# Patient Record
Sex: Female | Born: 1935 | Race: White | Hispanic: No | Smoking: Never smoker
Health system: Southern US, Community
[De-identification: ages and names within clinical notes are randomized; demographics above are authoritative.]

## PROBLEM LIST (undated history)

## (undated) DIAGNOSIS — J449 Chronic obstructive pulmonary disease, unspecified: Secondary | ICD-10-CM

## (undated) DIAGNOSIS — I441 Atrioventricular block, second degree: Secondary | ICD-10-CM

## (undated) DIAGNOSIS — J189 Pneumonia, unspecified organism: Secondary | ICD-10-CM

## (undated) DIAGNOSIS — I10 Essential (primary) hypertension: Secondary | ICD-10-CM

## (undated) DIAGNOSIS — N189 Chronic kidney disease, unspecified: Secondary | ICD-10-CM

## (undated) DIAGNOSIS — G20 Parkinson's disease: Secondary | ICD-10-CM

## (undated) DIAGNOSIS — I5032 Chronic diastolic (congestive) heart failure: Secondary | ICD-10-CM

## (undated) DIAGNOSIS — Z950 Presence of cardiac pacemaker: Secondary | ICD-10-CM

## (undated) DIAGNOSIS — D509 Iron deficiency anemia, unspecified: Secondary | ICD-10-CM

## (undated) HISTORY — PX: TOTAL ABDOMINAL HYSTERECTOMY: SHX209

## (undated) HISTORY — PX: APPENDECTOMY: SHX54

---

## 2015-04-08 ENCOUNTER — Emergency Department (HOSPITAL_COMMUNITY): Payer: Medicare Other

## 2015-04-08 ENCOUNTER — Inpatient Hospital Stay (HOSPITAL_COMMUNITY)
Admission: EM | Admit: 2015-04-08 | Discharge: 2015-04-14 | DRG: 242 | Disposition: A | Discharge status: Skilled Nursing Facility | Payer: Medicare Other | Attending: Cardiovascular Disease | Admitting: Cardiovascular Disease

## 2015-04-08 ENCOUNTER — Encounter (HOSPITAL_COMMUNITY): Payer: Self-pay | Admitting: Emergency Medicine

## 2015-04-08 DIAGNOSIS — I509 Heart failure, unspecified: Secondary | ICD-10-CM

## 2015-04-08 DIAGNOSIS — Z91128 Patient's intentional underdosing of medication regimen for other reason: Secondary | ICD-10-CM | POA: Diagnosis not present

## 2015-04-08 DIAGNOSIS — I5032 Chronic diastolic (congestive) heart failure: Secondary | ICD-10-CM | POA: Diagnosis present

## 2015-04-08 DIAGNOSIS — I5030 Unspecified diastolic (congestive) heart failure: Secondary | ICD-10-CM | POA: Diagnosis not present

## 2015-04-08 DIAGNOSIS — G20 Parkinson's disease: Secondary | ICD-10-CM | POA: Diagnosis present

## 2015-04-08 DIAGNOSIS — R0602 Shortness of breath: Secondary | ICD-10-CM | POA: Diagnosis present

## 2015-04-08 DIAGNOSIS — I130 Hypertensive heart and chronic kidney disease with heart failure and stage 1 through stage 4 chronic kidney disease, or unspecified chronic kidney disease: Secondary | ICD-10-CM | POA: Diagnosis present

## 2015-04-08 DIAGNOSIS — N179 Acute kidney failure, unspecified: Secondary | ICD-10-CM | POA: Diagnosis present

## 2015-04-08 DIAGNOSIS — I4510 Unspecified right bundle-branch block: Secondary | ICD-10-CM | POA: Diagnosis present

## 2015-04-08 DIAGNOSIS — R001 Bradycardia, unspecified: Secondary | ICD-10-CM | POA: Diagnosis not present

## 2015-04-08 DIAGNOSIS — E876 Hypokalemia: Secondary | ICD-10-CM | POA: Diagnosis not present

## 2015-04-08 DIAGNOSIS — Z791 Long term (current) use of non-steroidal anti-inflammatories (NSAID): Secondary | ICD-10-CM

## 2015-04-08 DIAGNOSIS — T501X6A Underdosing of loop [high-ceiling] diuretics, initial encounter: Secondary | ICD-10-CM | POA: Diagnosis present

## 2015-04-08 DIAGNOSIS — N189 Chronic kidney disease, unspecified: Secondary | ICD-10-CM | POA: Diagnosis present

## 2015-04-08 DIAGNOSIS — Z8249 Family history of ischemic heart disease and other diseases of the circulatory system: Secondary | ICD-10-CM

## 2015-04-08 DIAGNOSIS — Z7982 Long term (current) use of aspirin: Secondary | ICD-10-CM | POA: Diagnosis not present

## 2015-04-08 DIAGNOSIS — R55 Syncope and collapse: Secondary | ICD-10-CM | POA: Diagnosis not present

## 2015-04-08 DIAGNOSIS — D519 Vitamin B12 deficiency anemia, unspecified: Secondary | ICD-10-CM | POA: Diagnosis present

## 2015-04-08 DIAGNOSIS — I350 Nonrheumatic aortic (valve) stenosis: Secondary | ICD-10-CM | POA: Diagnosis not present

## 2015-04-08 DIAGNOSIS — I5033 Acute on chronic diastolic (congestive) heart failure: Secondary | ICD-10-CM

## 2015-04-08 DIAGNOSIS — Z959 Presence of cardiac and vascular implant and graft, unspecified: Secondary | ICD-10-CM

## 2015-04-08 DIAGNOSIS — I5022 Chronic systolic (congestive) heart failure: Secondary | ICD-10-CM

## 2015-04-08 DIAGNOSIS — Z9981 Dependence on supplemental oxygen: Secondary | ICD-10-CM

## 2015-04-08 DIAGNOSIS — J449 Chronic obstructive pulmonary disease, unspecified: Secondary | ICD-10-CM | POA: Diagnosis present

## 2015-04-08 DIAGNOSIS — D509 Iron deficiency anemia, unspecified: Secondary | ICD-10-CM | POA: Diagnosis present

## 2015-04-08 DIAGNOSIS — N183 Chronic kidney disease, stage 3 (moderate): Secondary | ICD-10-CM | POA: Diagnosis present

## 2015-04-08 DIAGNOSIS — I5031 Acute diastolic (congestive) heart failure: Secondary | ICD-10-CM | POA: Diagnosis not present

## 2015-04-08 DIAGNOSIS — R0600 Dyspnea, unspecified: Secondary | ICD-10-CM

## 2015-04-08 DIAGNOSIS — I441 Atrioventricular block, second degree: Principal | ICD-10-CM | POA: Diagnosis present

## 2015-04-08 DIAGNOSIS — I10 Essential (primary) hypertension: Secondary | ICD-10-CM | POA: Diagnosis not present

## 2015-04-08 DIAGNOSIS — Z950 Presence of cardiac pacemaker: Secondary | ICD-10-CM | POA: Diagnosis present

## 2015-04-08 HISTORY — DX: Chronic kidney disease, unspecified: N18.9

## 2015-04-08 HISTORY — DX: Chronic obstructive pulmonary disease, unspecified: J44.9

## 2015-04-08 HISTORY — DX: Chronic diastolic (congestive) heart failure: I50.32

## 2015-04-08 HISTORY — DX: Presence of cardiac pacemaker: Z95.0

## 2015-04-08 HISTORY — DX: Essential (primary) hypertension: I10

## 2015-04-08 HISTORY — DX: Parkinson's disease: G20

## 2015-04-08 HISTORY — DX: Iron deficiency anemia, unspecified: D50.9

## 2015-04-08 HISTORY — DX: Atrioventricular block, second degree: I44.1

## 2015-04-08 LAB — COMPREHENSIVE METABOLIC PANEL
ALBUMIN: 3.8 g/dL (ref 3.5–5.0)
ALT: 15 U/L (ref 14–54)
ANION GAP: 9 (ref 5–15)
AST: 27 U/L (ref 15–41)
Alkaline Phosphatase: 68 U/L (ref 38–126)
BILIRUBIN TOTAL: 0.6 mg/dL (ref 0.3–1.2)
BUN: 31 mg/dL — ABNORMAL HIGH (ref 6–20)
CHLORIDE: 103 mmol/L (ref 101–111)
CO2: 28 mmol/L (ref 22–32)
Calcium: 9.2 mg/dL (ref 8.9–10.3)
Creatinine, Ser: 1.37 mg/dL — ABNORMAL HIGH (ref 0.44–1.00)
GFR calc Af Amer: 36 mL/min — ABNORMAL LOW (ref >60)
GFR, EST NON AFRICAN AMERICAN: 31 mL/min — AB (ref >60)
GLUCOSE: 109 mg/dL — AB (ref 65–99)
POTASSIUM: 4.6 mmol/L (ref 3.5–5.1)
Sodium: 140 mmol/L (ref 135–145)
TOTAL PROTEIN: 6.7 g/dL (ref 6.5–8.1)

## 2015-04-08 LAB — CBC WITH DIFFERENTIAL/PLATELET
BASOS ABS: 0 10*3/uL (ref 0.0–0.1)
BASOS PCT: 0 %
EOS PCT: 4 %
Eosinophils Absolute: 0.3 10*3/uL (ref 0.0–0.7)
HEMATOCRIT: 30.4 % — AB (ref 36.0–46.0)
Hemoglobin: 9.4 g/dL — ABNORMAL LOW (ref 12.0–15.0)
Lymphocytes Relative: 23 %
Lymphs Abs: 1.5 10*3/uL (ref 0.7–4.0)
MCH: 24.9 pg — ABNORMAL LOW (ref 26.0–34.0)
MCHC: 30.9 g/dL (ref 30.0–36.0)
MCV: 80.6 fL (ref 78.0–100.0)
MONO ABS: 0.5 10*3/uL (ref 0.1–1.0)
MONOS PCT: 7 %
NEUTROS ABS: 4.4 10*3/uL (ref 1.7–7.7)
Neutrophils Relative %: 66 %
PLATELETS: 247 10*3/uL (ref 150–400)
RBC: 3.77 MIL/uL — ABNORMAL LOW (ref 3.87–5.11)
RDW: 16.1 % — AB (ref 11.5–15.5)
WBC: 6.6 10*3/uL (ref 4.0–10.5)

## 2015-04-08 LAB — MRSA PCR SCREENING: MRSA BY PCR: NEGATIVE

## 2015-04-08 LAB — TROPONIN I

## 2015-04-08 LAB — BRAIN NATRIURETIC PEPTIDE: B NATRIURETIC PEPTIDE 5: 951.7 pg/mL — AB (ref 0.0–100.0)

## 2015-04-08 MED ORDER — AMLODIPINE BESYLATE 5 MG PO TABS
5.0000 mg | ORAL_TABLET | Freq: Every morning | ORAL | Status: DC
  Administered 2015-04-09 – 2015-04-14 (×6): 5 mg via ORAL
  Filled 2015-04-08 (×6): qty 1

## 2015-04-08 MED ORDER — LORATADINE 10 MG PO TABS
10.0000 mg | ORAL_TABLET | Freq: Every day | ORAL | Status: DC
  Administered 2015-04-08 – 2015-04-14 (×6): 10 mg via ORAL
  Filled 2015-04-08 (×6): qty 1

## 2015-04-08 MED ORDER — ENOXAPARIN SODIUM 30 MG/0.3ML ~~LOC~~ SOLN
30.0000 mg | SUBCUTANEOUS | Status: DC
  Administered 2015-04-08 – 2015-04-11 (×4): 30 mg via SUBCUTANEOUS
  Filled 2015-04-08 (×4): qty 0.3

## 2015-04-08 MED ORDER — IRBESARTAN 150 MG PO TABS
150.0000 mg | ORAL_TABLET | Freq: Every day | ORAL | Status: DC
  Administered 2015-04-08 – 2015-04-14 (×6): 150 mg via ORAL
  Filled 2015-04-08 (×8): qty 1

## 2015-04-08 MED ORDER — ACETAMINOPHEN 325 MG PO TABS
650.0000 mg | ORAL_TABLET | ORAL | Status: DC | PRN
  Administered 2015-04-08 – 2015-04-11 (×4): 650 mg via ORAL
  Filled 2015-04-08 (×4): qty 2

## 2015-04-08 MED ORDER — LEVOTHYROXINE SODIUM 88 MCG PO TABS
88.0000 ug | ORAL_TABLET | Freq: Every day | ORAL | Status: DC
  Administered 2015-04-09 – 2015-04-14 (×6): 88 ug via ORAL
  Filled 2015-04-08 (×6): qty 1

## 2015-04-08 MED ORDER — CELECOXIB 100 MG PO CAPS
100.0000 mg | ORAL_CAPSULE | Freq: Every day | ORAL | Status: DC
  Administered 2015-04-08 – 2015-04-14 (×7): 100 mg via ORAL
  Filled 2015-04-08 (×9): qty 1

## 2015-04-08 MED ORDER — PANTOPRAZOLE SODIUM 40 MG PO TBEC
40.0000 mg | DELAYED_RELEASE_TABLET | Freq: Every day | ORAL | Status: DC
  Administered 2015-04-08 – 2015-04-14 (×6): 40 mg via ORAL
  Filled 2015-04-08 (×6): qty 1

## 2015-04-08 MED ORDER — VITAMIN B-12 1000 MCG PO TABS
1000.0000 ug | ORAL_TABLET | Freq: Every day | ORAL | Status: DC
  Administered 2015-04-08 – 2015-04-14 (×6): 1000 ug via ORAL
  Filled 2015-04-08 (×6): qty 1

## 2015-04-08 MED ORDER — SODIUM CHLORIDE 0.9 % IV SOLN: 250.0000 mL | INTRAVENOUS | Status: DC | PRN

## 2015-04-08 MED ORDER — SODIUM CHLORIDE 0.9 % IJ SOLN
3.0000 mL | Freq: Two times a day (BID) | INTRAMUSCULAR | Status: DC
  Administered 2015-04-08 – 2015-04-13 (×9): 3 mL via INTRAVENOUS

## 2015-04-08 MED ORDER — ONDANSETRON HCL 4 MG/2ML IJ SOLN: 4.0000 mg | Freq: Four times a day (QID) | INTRAMUSCULAR | Status: DC | PRN

## 2015-04-08 MED ORDER — SODIUM CHLORIDE 0.9 % IJ SOLN: 3.0000 mL | INTRAMUSCULAR | Status: DC | PRN

## 2015-04-08 MED ORDER — FUROSEMIDE 10 MG/ML IJ SOLN
40.0000 mg | Freq: Every day | INTRAMUSCULAR | Status: DC
  Administered 2015-04-08 – 2015-04-09 (×2): 40 mg via INTRAVENOUS
  Filled 2015-04-08 (×2): qty 4

## 2015-04-08 MED ORDER — ALBUTEROL SULFATE (2.5 MG/3ML) 0.083% IN NEBU: 2.5000 mg | INHALATION_SOLUTION | Freq: Four times a day (QID) | RESPIRATORY_TRACT | Status: DC | PRN

## 2015-04-08 MED ORDER — CITALOPRAM HYDROBROMIDE 20 MG PO TABS
20.0000 mg | ORAL_TABLET | Freq: Every day | ORAL | Status: DC
  Administered 2015-04-08 – 2015-04-13 (×6): 20 mg via ORAL
  Filled 2015-04-08 (×6): qty 1

## 2015-04-08 MED ORDER — ASPIRIN EC 81 MG PO TBEC
81.0000 mg | DELAYED_RELEASE_TABLET | Freq: Every day | ORAL | Status: DC
  Administered 2015-04-08 – 2015-04-14 (×6): 81 mg via ORAL
  Filled 2015-04-08 (×7): qty 1

## 2015-04-08 NOTE — H&P (Signed)
Patient ID: Gladiola Madore MRN: [id number], DOB/AGE: 06-01-1915   Admit date: 04/08/2015  Primary Physician: No primary care provider on file. Primary Cardiologist: New - Dr. Allyson Sabal   NWG:NFAOZHY Summer Graham is a 81 y.o. female with past medical history of HTN, COPD, and CHF who presents to [facility] ED on 04/08/2015 for a presyncopal episode this morning.   Patient reports she was walking back from the restroom with the help of a CNA when she reported feeling dizzy and like she might pass out. She did not have an actual syncopal event. The patient reports she frequently feels like this with walking, but she does not tell anyone. She reports having dyspnea at rest and with exertion ever sine "having a bad case of bronchitis last year". She is on 2.5L O2 at all times. She reports her current dyspnea is at baseline and denies any recent worsening in her respiratory status.  She reports having lower extremity edema at the end of the day, but says it is markedly improved in the morning hours. She denies any chest pain, palpitations, orthopnea, or PND. She does not know her baseline weight and has not "weighed in months".   She reports having never seen a cardiologist before. Denies any history of CAD or cardiac catheterizations. Reports she is on Lasix for heart failure but says that is managed by her PCP at her living facility (Dr. Ok Anis). She did quit taking the Lasix several months back due to being annoyed with frequent urination, but says she has been taking it as prescribed (  daily) over the past few months. She denies ever having been told she has a low HR. Says her BP has been well controlled recently.  While in the ED, her HR has been 34 -72. BNP was found to be elevated to 951. Initial troponin negative. Creatinine 1.37 (unsure of baseline), Hgb at 9.4. CXR showing pulmonary vascular congestion along with bibasilar atelectasis and bronchitic changes. EKG showing 2:1 heart block with TWI in  V3, V4, and V5.  Currently resides at [facility] in [place], [place]. Denies any tobacco use, alcohol use, or illicit drug use during her lifetime.  Problem List  Past Medical History  Diagnosis Date  . Hypertension   . Heart failure (HCC)   . COPD (chronic obstructive pulmonary disease) (HCC)     O2 requirement    Past Surgical History  Procedure Laterality Date  . Appendectomy      At age 7  . Total abdominal hysterectomy      Age 29     Home Medications  Prior to Admission medications   Medication Sig Start Date End Date Taking? Authorizing Provider  acetaminophen (TYLENOL) 500 MG tablet Take 500 mg by mouth every 6 (six) hours as needed for mild pain.   Yes Historical Provider, MD  amLODipine (NORVASC) 5 MG tablet Take 5 mg by mouth every morning.   Yes Historical Provider, MD  aspirin EC 81 MG tablet Take 81 mg by mouth daily.   Yes Historical Provider, MD  celecoxib (CELEBREX) 100 MG capsule Take 100 mg by mouth daily.   Yes Historical Provider, MD  Cholecalciferol (VITAMIN D3 SUPER STRENGTH) 2000 UNITS CAPS Take 2,000 Units by mouth daily.   Yes Historical Provider, MD  citalopram (CELEXA) 40 MG tablet Take 20-40 mg by mouth at bedtime.   Yes Historical Provider, MD  clotrimazole (LOTRIMIN) 1 % cream Apply 1 application topically 2 (two) times daily.   Yes Historical Provider,  MD  furosemide (LASIX) 20 MG tablet Take 20 mg by mouth daily.   Yes Historical Provider, MD  levothyroxine (SYNTHROID, LEVOTHROID) 88 MCG tablet Take 88 mcg by mouth daily before breakfast.   Yes Historical Provider, MD  loratadine (CLARITIN) 10 MG tablet Take 10 mg by mouth daily.   Yes Historical Provider, MD  metoprolol (LOPRESSOR) 100 MG tablet Take 100 mg by mouth 2 (two) times daily.   Yes Historical Provider, MD  Multiple Vitamin (MULTIVITAMIN WITH MINERALS) TABS tablet Take 1 tablet by mouth daily.   Yes Historical Provider, MD  omeprazole (PRILOSEC) 20 MG capsule Take 20 mg by mouth daily.    Yes Historical Provider, MD  valsartan (DIOVAN) 160 MG tablet Take 160 mg by mouth daily.   Yes Historical Provider, MD  vitamin B-12 (CYANOCOBALAMIN) 1000 MCG tablet Take 1,000 mcg by mouth daily.   Yes Historical Provider, MD    Allergies No Known Allergies  Past Medical History Past Medical History  Diagnosis Date  . Hypertension   . Heart failure (HCC)   . COPD (chronic obstructive pulmonary disease) (HCC)     O2 requirement     Surgical History   Past Surgical History  Procedure Laterality Date  . Appendectomy      At age 81  . Total abdominal hysterectomy      Age [age]     Family History  Family History  Problem Relation Age of Onset  . Hypertension Mother   . Hypertension Father     Social History  Social History   Social History  . Marital Status: Widowed    Spouse Name: N/A  . Number of Children: N/A  . Years of Education: N/A   Occupational History  . Not on file.   Social History Main Topics  . Smoking status: Never Smoker   . Smokeless tobacco: Not on file  . Alcohol Use: No  . Drug Use: No  . Sexual Activity: Not on file   Other Topics Concern  . Not on file   Social History Narrative   [facility] [place]. Uses a walker and wheelchair for ambulation.     Review of Systems General:  No chills, fever, night sweats or weight changes.  Cardiovascular:  No chest pain, orthopnea, palpitations, paroxysmal nocturnal dyspnea. Positive for dyspnea on exertion and edema.  Dermatological: No rash, lesions/masses Respiratory: No cough, Positive for dyspnea Urologic: No hematuria, dysuria Abdominal:   No nausea, vomiting, diarrhea, bright red blood per rectum, melena, or hematemesis Neurologic:  No visual changes, wkns, changes in mental status. All other systems reviewed and are otherwise negative except as noted above.   Physical Exam: Blood pressure 138/91, pulse 35, temperature 98.7 F (37.1 C), temperature  source Axillary, resp. rate 17, SpO2 89 %.  General: Delightful, elderly ,female in no acute distress. Head: Normocephalic, atraumatic, sclera non-icteric, no xanthomas, nares are without discharge.  Neck: Bilateral carotid bruits. JVD elevated.  Lungs: Respirations regular. Labored with activity. Rales at bases bilaterally. Heart: Regular rhythm, bradycardiac rate. No S3 or S4.  2/6 SEM at LUSB, no rubs, or gallops appreciated. Abdomen: Soft, non-tender, non-distended with normoactive bowel sounds. No hepatomegaly. No rebound/guarding. No obvious abdominal masses. Msk:  Strength and tone appear normal for age. No joint deformities or effusions. Extremities: No clubbing or cyanosis. Trace edema.  Distal pedal pulses are 2+ bilaterally. Neuro: Alert and oriented X 3. Moves all extremities spontaneously. No focal deficits noted. Psych:  Responds  to questions appropriately with a normal affect. Skin: No rashes or lesions noted   Labs Lab Results  Component Value Date   WBC 6.6 04/08/2015   HGB 9.4* 04/08/2015   HCT 30.4* 04/08/2015   MCV 80.6 04/08/2015   PLT 247 04/08/2015     Recent Labs Lab 04/08/15 1108  NA 140  K 4.6  CL 103  CO2 28  BUN 31*  CREATININE 1.37*  CALCIUM 9.2  PROT 6.7  BILITOT 0.6  ALKPHOS 68  ALT 15  AST 27  GLUCOSE 109*   Troponin (Point of Care Test) No results for input(s): TROPIPOC in the last 72 hours.  Recent Labs  04/08/15 1108  TROPONINI <0.03   No results found for: CHOL, HDL, LDLCALC, TRIG B NATRIURETIC PEPTIDE  Date/Time Value Ref Range Status  04/08/2015 11:08 AM 951.7* 0.0 - 100.0 pg/mL Final    ECG: 2:1 Heart Block and TWI in V3, V4, and V5  Echo: None on File  Radiology/Studies: Dg Chest 2 View: 04/08/2015  CLINICAL DATA:  Near syncope today, shortness of breath on exertion for a unspecified length of time, possible pneumonia, personal history hypertension and heart failure EXAM: CHEST  2 VIEW COMPARISON:  None FINDINGS:  Enlargement of cardiac silhouette with pulmonary vascular congestion. Atherosclerotic calcification aorta. Elevation versus eventration of RIGHT diaphragm. Bronchitic changes with bibasilar atelectasis. No pleural effusion or pneumothorax. Bones demineralized. IMPRESSION: Enlargement of cardiac silhouette with pulmonary vascular congestion. Bibasilar atelectasis and central bronchitic changes. Electronically Signed   By: Ulyses Southward M.D.   On: 04/08/2015 11:03   ASSESSMENT AND PLAN:   1. Acute on Chronic Congestive Heart Failure - having dyspnea on exertion for the past several months and says her breathing is at baseline. On exam, she has elevated JVD, rales at the bases of her lungs bilaterally, and lower extremity edema. - BNP elevated to 951.  - Will obtain echocardiogram to further quantify - Obtain daily weights. Strict I&O's.  - Was on Lasix  daily PTA. Will start on Lasix  IV daily.  2. Bradycardia secondary to 2:1 Heart Block - HR has been in 30's - 70's while in the ED.  - Patient is unaware of any history of bradycardia - will discontinue home Metoprolol. - will ask EP to see.  3. Normocytic Anemia - Hgb 9.4 at time of admission - continue to monitor - will continue home Vitamin B-12 (although anemia due to B-12 deficiency would be macrocytic).  - will check ferritin.   4. COPD - continue home O2 - Albuterol Nebulizer PRN    Signed, Ellsworth Lennox, PA-C 04/08/2015, 1:25 PM Pager: [phone]  Agree with note by Reita May  Asked to see elderly female with presyncope. H/O ? CHF on PO lasix . She C/O DOE and does have basilar rales, 2/6 outflow murmur ? AS. Will check 2D. Her EKG shows 2:1 AVB with VR in the 30s. We discussed possibility of PTVPM and she is open to this. Labs OK. Will D/C BB and ask EP to see. Admit to step down   Runell Gess, M.D., Jerrel Ivory [facility], Kathryne Eriksson [facility] [address] [place], [place]  [address]  [phone] 04/08/2015 3:36 PM

## 2015-04-08 NOTE — ED Provider Notes (Signed)
CSN: [id number]     Arrival date & time 04/08/15  4098 History   First MD Initiated Contact with Patient 04/08/15 1021     Chief Complaint  Patient presents with  . Near Syncope     (Consider location/radiation/quality/duration/timing/severity/associated sxs/prior Treatment) Patient is a 81 y.o. female presenting with shortness of breath.  Shortness of Breath Severity:  Moderate Onset quality:  Gradual Duration:  2 weeks Timing:  Constant Progression:  Worsening Chronicity:  Recurrent Context: activity   Context: not fumes   Relieved by:  None tried Worsened by:  Nothing tried Ineffective treatments:  None tried Associated symptoms: cough   Associated symptoms: no abdominal pain, no chest pain, no fever, no headaches and no neck pain   Risk factors: no hx of PE/DVT and no obesity     Past Medical History  Diagnosis Date  . Hypertension   . Heart failure (HCC)   . Arthritis     Knees Bilaterally  . Parkinson's disease [facility])    Past Surgical History  Procedure Laterality Date  . Appendectomy      At age 89  . Total abdominal hysterectomy      Age 44   Family History  Problem Relation Age of Onset  . Hypertension Mother   . Hypertension Father    Social History  Substance Use Topics  . Smoking status: Never Smoker   . Smokeless tobacco: Not on file  . Alcohol Use: No   OB History    No data available     Review of Systems  Constitutional: Negative for fever.  HENT: Negative for congestion and facial swelling.   Eyes: Negative for discharge and redness.  Respiratory: Positive for cough and shortness of breath.   Cardiovascular: Negative for chest pain.  Gastrointestinal: Negative for abdominal pain and abdominal distention.  Endocrine: Negative for polydipsia.  Genitourinary: Negative for dysuria.  Musculoskeletal: Negative for back pain and neck pain.  Skin: Negative for wound.  Neurological: Negative for headaches.  All other systems reviewed and  are negative.     Allergies  Review of patient's allergies indicates no known allergies.  Home Medications   Prior to Admission medications   Medication Sig Start Date End Date Taking? Authorizing Provider  acetaminophen (TYLENOL) 500 MG tablet Take 500 mg by mouth every 6 (six) hours as needed for mild pain.   Yes Historical Provider, MD  amLODipine (NORVASC) 5 MG tablet Take 5 mg by mouth every morning.   Yes Historical Provider, MD  aspirin EC 81 MG tablet Take 81 mg by mouth daily.   Yes Historical Provider, MD  celecoxib (CELEBREX) 100 MG capsule Take 100 mg by mouth daily.   Yes Historical Provider, MD  Cholecalciferol (VITAMIN D3 SUPER STRENGTH) 2000 UNITS CAPS Take 2,000 Units by mouth daily.   Yes Historical Provider, MD  citalopram (CELEXA) 40 MG tablet Take 20-40 mg by mouth at bedtime.   Yes Historical Provider, MD  clotrimazole (LOTRIMIN) 1 % cream Apply 1 application topically 2 (two) times daily.   Yes Historical Provider, MD  furosemide (LASIX) 20 MG tablet Take 20 mg by mouth daily.   Yes Historical Provider, MD  levothyroxine (SYNTHROID, LEVOTHROID) 88 MCG tablet Take 88 mcg by mouth daily before breakfast.   Yes Historical Provider, MD  loratadine (CLARITIN) 10 MG tablet Take 10 mg by mouth daily.   Yes Historical Provider, MD  metoprolol (LOPRESSOR) 100 MG tablet Take 100 mg by mouth 2 (two) times daily.  Yes Historical Provider, MD  Multiple Vitamin (MULTIVITAMIN WITH MINERALS) TABS tablet Take 1 tablet by mouth daily.   Yes Historical Provider, MD  omeprazole (PRILOSEC) 20 MG capsule Take 20 mg by mouth daily.   Yes Historical Provider, MD  valsartan (DIOVAN) 160 MG tablet Take 160 mg by mouth daily.   Yes Historical Provider, MD  vitamin B-12 (CYANOCOBALAMIN) 1000 MCG tablet Take 1,000 mcg by mouth daily.   Yes Historical Provider, MD   BP 132/47 mmHg  Pulse 37  Temp(Src) 98.7 F (37.1 C) (Axillary)  Resp 27  SpO2 84% Physical Exam  Constitutional: She  appears well-developed and well-nourished.  HENT:  Head: Normocephalic and atraumatic.  Neck: Normal range of motion.  Cardiovascular: Regular rhythm.  Bradycardia present.   Pulmonary/Chest: No stridor. No respiratory distress. She has decreased breath sounds. She has rales.  Abdominal: Soft. Bowel sounds are normal. She exhibits no distension. There is no tenderness. There is no rebound.  Musculoskeletal: Normal range of motion. She exhibits edema (mild BLE). She exhibits no tenderness.  Neurological: She is alert. No cranial nerve deficit.  Nursing note and vitals reviewed.   ED Course  Procedures (including critical care time) Labs Review Labs Reviewed  BRAIN NATRIURETIC PEPTIDE - Abnormal; Notable for the following:    B Natriuretic Peptide 951.7 (*)    All other components within normal limits  CBC WITH DIFFERENTIAL/PLATELET - Abnormal; Notable for the following:    RBC 3.77 (*)    Hemoglobin 9.4 (*)    HCT 30.4 (*)    MCH 24.9 (*)    RDW 16.1 (*)    All other components within normal limits  COMPREHENSIVE METABOLIC PANEL - Abnormal; Notable for the following:    Glucose, Bld 109 (*)    BUN 31 (*)    Creatinine, Ser 1.37 (*)    GFR calc non Af Amer 31 (*)    GFR calc Af Amer 36 (*)    All other components within normal limits  TROPONIN I    Imaging Review Dg Chest 2 View  04/08/2015  CLINICAL DATA:  Near syncope today, shortness of breath on exertion for a unspecified length of time, possible pneumonia, personal history hypertension and heart failure EXAM: CHEST  2 VIEW COMPARISON:  None FINDINGS: Enlargement of cardiac silhouette with pulmonary vascular congestion. Atherosclerotic calcification aorta. Elevation versus eventration of RIGHT diaphragm. Bronchitic changes with bibasilar atelectasis. No pleural effusion or pneumothorax. Bones demineralized. IMPRESSION: Enlargement of cardiac silhouette with pulmonary vascular congestion. Bibasilar atelectasis and central  bronchitic changes. Electronically Signed   By: Ulyses Southward M.D.   On: 04/08/2015 11:03   I have personally reviewed and evaluated these images and lab results as part of my medical decision-making.   EKG Interpretation   Date/Time:  Thursday April 08 2015 10:10:27 EST Ventricular Rate:  65 PR Interval:  182 QRS Duration: 123 QT Interval:  481 QTC Calculation: 500 R Axis:   45 Text Interpretation:  Sinus rhythm Nonspecific intraventricular conduction  delay Abnormal T, consider ischemia, diffuse leads No previous ECGs  available Confirmed by [facility] MD, Barbara Cower [phone]) on 04/08/2015 10:22:05 AM      MDM   Final diagnoses:  Bradycardia  Near syncope  Heart block AV second degree  Acute on chronic congestive heart failure, unspecified congestive heart failure type [facility])   81 year old female sent here supposedly for a near syncopal event where she was walking short of breath and weak in her legs so sat down. Patient  states she's had worsening shortness of breath on exertion for a long period time now. Has a history of CHF is on beta blockers and Lasix for that. No worsening swelling and she's noted. On exam patient will have intermittently heart rates as low as 31 but is awake and alert without any distress during that time. She has significant desaturations with minimal movements, Rales in bilateral lungs consistent with CHF exacerbation. Pacer pads were placed but never paced secondary to relative stability of her bradycardia. A BNP of about 900 other than that labs were normal. Discussed case with cardiology about admission for CHF exacerbation and monitoring for this bradycardia while they hold metoprolol and decide whether or not she needs a pacemaker.      Marily MemosJason Tiny Chaudhary, MD 04/08/15 [phone]

## 2015-04-08 NOTE — ED Notes (Signed)
Placed pacer pads on patient and Zoll at bedside per EDP request. Pt alert and oriented and denies pain

## 2015-04-08 NOTE — Progress Notes (Signed)
RT called to patient room by RN due to patient desaturating.  Rn had increased patient from Summer Graham to 50% venti mask, which increased SpO2 to 91-92% range.  Patient denies present shortness of breath and is resting comfortably.  No wheezing present upon auscultation, only basilar rales.  No need for albuterol at this time.  RT will continue to monitor patient, with suggestion that FiO2 be kept at present level until correction in HR/Fluid status.

## 2015-04-08 NOTE — ED Notes (Signed)
Attempted report 

## 2015-04-08 NOTE — Consult Note (Signed)
ELECTROPHYSIOLOGY CONSULT NOTE    Patient ID: Summer ProvencalBlanche Speir MRN: [id number], DOB/AGE: 1916/01/28 81 y.o.  Admit date: 04/08/2015 Date of Consult: 04/08/2015   Primary Physician: No primary care provider on file. Primary Cardiologist: (new) Dr. Allyson SabalBerry  Reason for Consultation: Bradycardia, heart block  HPI: Summer Graham is a 81 y.o. female who was [facility] tot [facility] ER after a near fainting episode.  She has PMHx of HTN, parkinson's and arthritis only, she denies any known cardiac She reports that this morning she became SOB ambulating from the bathroom, after sitting to rest she became weak and felt like she might faint but did not.  She was with her CNA at the time, did not have any fall or trauma.  She reports feeling SOB of late, reporting a recent bronchitis, but is on home O2 as well.  She denies similar symptoms in the past to us , though previous note with cardiology states she had reports similar events in the recent past. The patient denies any kind of CP, she is supine at this time and completely asymptomatic  While in the ED, her HR has been reported 34 -72. Her BNP was found to be elevated to 951. Initial troponin negative. Creatinine 1.37 (unsure of baseline), Hgb at 9.4. CXR showing pulmonary vascular congestion along with bibasilar atelectasis and bronchitic changes. EKG showing 2:1 heart block with TWI in V3, V4, and V5. Seen by cardiology service this morning.  Her BP appear to be stable.  The patient has been chronically on Metoprolol 100mg  BID for HTN which cardiology has held.  Past Medical History  Diagnosis Date  . Hypertension   . Heart failure (HCC)   . Arthritis     Knees Bilaterally  . Parkinson's disease [facility](HCC)      Surgical History:  Past Surgical History  Procedure Laterality Date  . Appendectomy      At age 81  . Total abdominal hysterectomy      Age 81      (Not in a hospital admission)  Inpatient Medications:   Allergies: No Known  Allergies  Social History   Social History  . Marital Status: Widowed    Spouse Name: N/A  . Number of Children: N/A  . Years of Education: N/A   Occupational History  . Not on file.   Social History Main Topics  . Smoking status: Never Smoker   . Smokeless tobacco: Not on file  . Alcohol Use: No  . Drug Use: No  . Sexual Activity: Not on file   Other Topics Concern  . Not on file   Social History Narrative   [facility] [place]. Uses a walker and wheelchair for ambulation.     Family History  Problem Relation Age of Onset  . Hypertension Mother   . Hypertension Father      Review of Systems: General: No chills, fever, night sweats or weight changes  Cardiovascular:  No chest pain, dyspnea on exertion, edema, orthopnea, palpitations, paroxysmal nocturnal dyspnea Dermatological: No rash, lesions or masses Respiratory: No cough, dyspnea Urologic: No hematuria, dysuria Abdominal: No nausea, vomiting, diarrhea, bright red blood per rectum, melena, or hematemesis Neurologic: No visual changes, weakness, changes in mental status All other systems reviewed and are otherwise negative except as noted above.  Physical Exam: Filed Vitals:   04/08/15 1334 04/08/15 1400 04/08/15 1500 04/08/15 1516  BP: 154/45 132/47 136/42   Pulse: 60 37 30   Temp:  TempSrc:      Resp: SpO2: 88% 84% 89% 94%    GEN- The patient is well appearing, alert and oriented x 3 today.   HEENT: normocephalic, atraumatic; sclera clear, conjunctiva pink; hearing intact; oropharynx clear; neck supple, no JVP Lymph- no cervical lymphadenopathy Lungs- Clear to ausculation bilaterally, normal work of breathing.  No wheezes, rales, rhonchi Heart- Regular rate and rhythm, no murmurs, rubs or gallops, PMI not laterally displaced GI- soft, non-tender, non-distended, bowel sounds present Extremities- no clubbing, cyanosis, or edema; DP/PT/radial pulses 2+  bilaterally MS- no significant deformity or atrophy Skin- warm and dry, no rash or lesion Psych- euthymic mood, full affect Neuro- no gross deficits observed  Labs:   Lab Results  Component Value Date   WBC 6.6 04/08/2015   HGB 9.4* 04/08/2015   HCT 30.4* 04/08/2015   MCV 80.6 04/08/2015   PLT 247 04/08/2015    Recent Labs Lab 04/08/15 1108  NA 140  K 4.6  CL 103  CO2 28  BUN 31*  CREATININE 1.37*  CALCIUM 9.2  PROT 6.7  BILITOT 0.6  ALKPHOS 68  ALT 15  AST 27  GLUCOSE 109*      Radiology/Studies:  Dg Chest 2 View 04/08/2015  CLINICAL DATA:  Near syncope today, shortness of breath on exertion for a unspecified length of time, possible pneumonia, personal history hypertension and heart failure EXAM: CHEST  2 VIEW COMPARISON:  None FINDINGS: Enlargement of cardiac silhouette with pulmonary vascular congestion. Atherosclerotic calcification aorta. Elevation versus eventration of RIGHT diaphragm. Bronchitic changes with bibasilar atelectasis. No pleural effusion or pneumothorax. Bones demineralized. IMPRESSION: Enlargement of cardiac silhouette with pulmonary vascular congestion. Bibasilar atelectasis and central bronchitic changes. Electronically Signed   By: Ulyses Southward M.D.   On: 04/08/2015 11:03    EKG: Sinus, appears to have 3:2, 2:1 Mobitz 1  TELEMETRY: She is bradycardic, 35-37bpm, consistantly in 2:1 AVblock   Assessment and Plan:  1. Near syncope     2:1 AVblock, agree with holding her betablocker and observe her telemetry closely.       She is asymptomatic with a good BP while lying in bed     Do not use any nodal blockers for BP control     Echo is pending  2. CHF     Echo is pending with no historical data in Epic 3. COPD 4. Anemia    Signed, Francis Dowse, PA-C 04/08/2015 3:34 PM  I have seen and examined this patient with Francis Dowse.  Agree with above, note added to reflect my findings.  On exam, bradycardic, regular, no murmurs, lungs clear.   Presented with near syncope and found to be in 2:1 AV block. Is on beta blocker at home.  She is lying in bed and asymptomatic currently.  Marites Nath hold beta blockers to determine if her block improves.  If it does not improve, she would require pacemaker placement.  May have to wait 5 half lives for a full wash-out of the drug.    Elzy Tomasello M. Crosby Oriordan MD 04/08/2015 8:28 PM

## 2015-04-08 NOTE — ED Notes (Signed)
When placing patient on the bedpan she became short of breath and her O2 sats dropped to 85% on [place] at 4L O2. Sats remained low for several minutes.

## 2015-04-08 NOTE — ED Notes (Signed)
Patient transported to X-ray 

## 2015-04-08 NOTE — ED Notes (Signed)
Per EMS- [facility] [address] [place] c.o. Near syncope. Pt was walking when she felt like she was going to pass out after using the bathroom, so she sat down in her wheelchair. Denies LOC. HR noted to be 36 pt does take beta blockers

## 2015-04-08 NOTE — ED Notes (Signed)
Spoke with Staff from facility. Staff report pt heart rate is normally in the 60's. Pt has been having trouble maintaining oxygen sats. Staff reports pt does not have a history of COPD as thought. Pt does have history of heart failure and sees MD Stocking.

## 2015-04-08 NOTE — ED Notes (Signed)
Called patient's son for her with her permission. Family on way to hospital. Pt updated.

## 2015-04-09 ENCOUNTER — Inpatient Hospital Stay (HOSPITAL_COMMUNITY): Payer: Medicare Other

## 2015-04-09 DIAGNOSIS — I5030 Unspecified diastolic (congestive) heart failure: Secondary | ICD-10-CM

## 2015-04-09 DIAGNOSIS — I509 Heart failure, unspecified: Secondary | ICD-10-CM

## 2015-04-09 LAB — CBC WITH DIFFERENTIAL/PLATELET
BASOS PCT: 0 %
Basophils Absolute: 0 10*3/uL (ref 0.0–0.1)
Eosinophils Absolute: 0.3 10*3/uL (ref 0.0–0.7)
Eosinophils Relative: 5 %
HEMATOCRIT: 27.6 % — AB (ref 36.0–46.0)
HEMOGLOBIN: 8.6 g/dL — AB (ref 12.0–15.0)
LYMPHS ABS: 1.4 10*3/uL (ref 0.7–4.0)
LYMPHS PCT: 23 %
MCH: 25 pg — AB (ref 26.0–34.0)
MCHC: 31.2 g/dL (ref 30.0–36.0)
MCV: 80.2 fL (ref 78.0–100.0)
MONO ABS: 0.6 10*3/uL (ref 0.1–1.0)
MONOS PCT: 10 %
NEUTROS ABS: 3.8 10*3/uL (ref 1.7–7.7)
Neutrophils Relative %: 62 %
Platelets: 238 10*3/uL (ref 150–400)
RBC: 3.44 MIL/uL — ABNORMAL LOW (ref 3.87–5.11)
RDW: 16.2 % — AB (ref 11.5–15.5)
WBC: 6.2 10*3/uL (ref 4.0–10.5)

## 2015-04-09 LAB — BASIC METABOLIC PANEL
ANION GAP: 11 (ref 5–15)
BUN: 25 mg/dL — ABNORMAL HIGH (ref 6–20)
CO2: 29 mmol/L (ref 22–32)
Calcium: 8.9 mg/dL (ref 8.9–10.3)
Chloride: 101 mmol/L (ref 101–111)
Creatinine, Ser: 1.05 mg/dL — ABNORMAL HIGH (ref 0.44–1.00)
GFR calc Af Amer: 50 mL/min — ABNORMAL LOW (ref >60)
GFR calc non Af Amer: 43 mL/min — ABNORMAL LOW (ref >60)
GLUCOSE: 96 mg/dL (ref 65–99)
POTASSIUM: 3.9 mmol/L (ref 3.5–5.1)
Sodium: 141 mmol/L (ref 135–145)

## 2015-04-09 LAB — FERRITIN: Ferritin: 15 ng/mL (ref 11–307)

## 2015-04-09 MED ORDER — POTASSIUM CHLORIDE CRYS ER 20 MEQ PO TBCR
20.0000 meq | EXTENDED_RELEASE_TABLET | Freq: Every day | ORAL | Status: DC
  Administered 2015-04-09: 20 meq via ORAL
  Filled 2015-04-09: qty 1

## 2015-04-09 NOTE — Progress Notes (Signed)
  Echocardiogram 2D Echocardiogram has been performed.  Delcie RochENNINGTON, Gem Conkle 04/09/2015, 9:09 AM

## 2015-04-09 NOTE — Care Management Note (Signed)
Case Management Note  Patient Details  Name: Summer ProvencalBlanche Graham MRN: [id number] Date of Birth: March 27, 1916  Subjective/Objective:    Adm w heart block                Action/Plan: lives at home   Expected Discharge Date:                  Expected Discharge Plan:     In-House Referral:     Discharge planning Services     Post Acute Care Choice:    Choice offered to:     DME Arranged:    DME Agency:     HH Arranged:    HH Agency:     Status of Service:     Medicare Important Message Given:    Date Medicare IM Given:    Medicare IM give by:    Date Additional Medicare IM Given:    Additional Medicare Important Message give by:     If discussed at Long Length of Stay Meetings, dates discussed:    Additional Comments:ur review done  Hanley HaysDowell, Burnette Sautter T, RN 04/09/2015, 8:04 AM

## 2015-04-09 NOTE — Progress Notes (Signed)
    Subjective:  Echo performed this AM. No complaints. Reports her PCP is Dr. Pete GlatterStoneking.  Objective:  Vital Signs in the last 24 hours: Temp:  [97.7 F (36.5 C)-98.7 F (37.1 C)] 97.9 F (36.6 C) (11/11 0800) Pulse Rate:  [30-93] 36 (11/11 0800) Resp:  [16-34] 16 (11/11 0800) BP: (103-158)/(30-107) 127/30 mmHg (11/11 0700) SpO2:  [84 %-98 %] 92 % (11/11 0800) Weight:  [145 lb 11.6 oz (66.1 kg)-149 lb 7.6 oz (67.8 kg)] 145 lb 11.6 oz (66.1 kg) (11/11 0600)  Intake/Output from previous day: 11/10 0701 - 11/11 0700 In: 490 [P.O.:490] Out: 1850 [Urine:1850]  Physical Exam: General: elderly Caucasian female, resting in bed, 5L O2 by [place] HEENT: EOMI, no scleral icterus Cardiac: bradycardic, systolic ejection murmur best heard at the RUSB Pulm: clear to auscultation bilaterally in the anterior lung fields Abd: soft, nontender, nondistended, BS present Ext: warm and well perfused, no pedal edema Neuro: responds to questions appropriately; moving all extremities freely    Lab Results:  Recent Labs  04/08/15 1108 04/09/15 0535  WBC 6.6 6.2  HGB 9.4* 8.6*  PLT 247 238    Recent Labs  04/08/15 1108 04/09/15 0535  NA 140 141  K 4.6 3.9  CL 103 101  CO2 28 29  GLUCOSE 109* 96  BUN 31* 25*  CREATININE 1.37* 1.05*    Recent Labs  04/08/15 1108  TROPONINI <0.03    Cardiac Studies: Echo pending  Tele: HR 30s-40s, 2:1 AV block. Occasional PVCs.  Assessment/Plan:  Summer Graham is a 81 year old female with HTN, COPD [2.5L O2], CHF who presented with pre-syncope found to have acute on chronic CHF and 2:1 heart block.  2:1 heart block: Suspect she may have underlying structural disease with EKG findings this morning notable for RBBB and possibly left fascicular block given left axis deviation. Metoprolol is contributory. EP consulted who deferred pacemaker placement for now to allow for complete washout of the medication.  -Continue holding metoprolol and reassessing  her HR on telemetry -Follow-up echo report  Acute on chronic CHF: BNP 951.7 on admission with weight 149 lbs. Given Lasix 40mg  IV and now 145 lbs this AM. -Continue Lasix 40mg  IV and Kdur 20mg  daily -Continue ASA 81mg   HTN: Blood pressure trending 110-130s/30s-50s. Home meds include Norvasc 5mg , Diovan 150mg . -Continue Norvasc and irbesartan 150mg   COPD: Continue home oxygen and albuterol.  Elevated creatinine: Crt 1.4 on admission, now 1.1 this AM which is suggestive of CKD Stage 3. Unclear baseline. -Continue following.  Normocytic anemia: Hb 9.4 on admission though now 8.6 this AM. MCV in the 80s which is less consistent with B12/folate deficiency. Ferritin 15 which is low normal and less consistent with anemia of chronic disease.   -Check anemia panel in the AM    Heywood Ilesushil Patel, PGY2 Internal Medicine Pager: [phone] 04/09/2015, 8:57 AM   Agree with note by Dr. Allena KatzPatel. Pt seen yesterday with presyncope and 2:1 heart block. She was on high dose BB. BNP was elevated and she was diuresed. 2D shows preserved LV FXN, mild AS/AI. Labs OK except elevated BNP, mildly elevated SCr. Plan was to hold BB and see whether conduction abnormality resolves. If not she would benefit from PTVPM. EP (Dr Elberta Fortisamnitz) has seen.

## 2015-04-09 NOTE — Progress Notes (Addendum)
SUBJECTIVE: The patient is feels well this morning, denies any CP, palpitations,  She denies any SOB.  She has no new concerns.  Marland Kitchen amLODipine  5 mg Oral q morning - 10a  . aspirin EC  81 mg Oral Daily  . celecoxib  100 mg Oral Daily  . citalopram  20 mg Oral QHS  . enoxaparin (LOVENOX) injection  30 mg Subcutaneous Q24H  . furosemide  40 mg Intravenous Daily  . irbesartan  150 mg Oral Daily  . levothyroxine  88 mcg Oral QAC breakfast  . loratadine  10 mg Oral Daily  . pantoprazole  40 mg Oral Daily  . potassium chloride  20 mEq Oral Daily  . sodium chloride  3 mL Intravenous Q12H  . vitamin B-12  1,000 mcg Oral Daily      OBJECTIVE: Physical Exam: Filed Vitals:   04/09/15 0700 04/09/15 0800 04/09/15 1100 04/09/15 1120  BP: 127/30  121/41   Pulse:  36  46  Temp:  97.9 F (36.6 C)  97.7 F (36.5 C)  TempSrc:  Oral  Oral  Resp:  16  22  Height:      Weight:      SpO2:  92%  85%    Intake/Output Summary (Last 24 hours) at 04/09/15 1142 Last data filed at 04/09/15 0600  Gross per 24 hour  Intake    490 ml  Output   1850 ml  Net  -1360 ml    Telemetry reveals 2:1 AV block, HR 30's-40's  GEN- The patient is elderly, appears in no distress, alert and oriented x 3 today.   Head- normocephalic, atraumatic Eyes-  Sclera clear, conjunctiva pink Ears- hearing intact Oropharynx- clear Neck- supple, no JVP Lungs- have b/l crackles at the bases, normal work of breathing Heart- bradycardic, soft SM, no rubs or gallops GI- soft, NT, ND, + BS Extremities- no clubbing, cyanosis, or edema Skin- no rash or lesion Psych- euthymic mood, full affect Neuro- no gross deficits appreciated  LABS: Basic Metabolic Panel:  Recent Labs  16/10/96 1108 04/09/15 0535  NA 140 141  K 4.6 3.9  CL 103 101  CO2 28 29  GLUCOSE 109* 96  BUN 31* 25*  CREATININE 1.37* 1.05*  CALCIUM 9.2 8.9   Liver Function Tests:  Recent Labs  04/08/15 1108  AST 27  ALT 15  ALKPHOS 68    BILITOT 0.6  PROT 6.7  ALBUMIN 3.8   CBC:  Recent Labs  04/08/15 1108 04/09/15 0535  WBC 6.6 6.2  NEUTROABS 4.4 3.8  HGB 9.4* 8.6*  HCT 30.4* 27.6*  MCV 80.6 80.2  PLT 247 238   Cardiac Enzymes:  Recent Labs  04/08/15 1108  TROPONINI <0.03   Anemia Panel:  Recent Labs  04/09/15 0535  FERRITIN 15    RADIOLOGY: Dg Chest 2 View 04/08/2015  CLINICAL DATA:  Near syncope today, shortness of breath on exertion for a unspecified length of time, possible pneumonia, personal history hypertension and heart failure EXAM: CHEST  2 VIEW COMPARISON:  None FINDINGS: Enlargement of cardiac silhouette with pulmonary vascular congestion. Atherosclerotic calcification aorta. Elevation versus eventration of RIGHT diaphragm. Bronchitic changes with bibasilar atelectasis. No pleural effusion or pneumothorax. Bones demineralized. IMPRESSION: Enlargement of cardiac silhouette with pulmonary vascular congestion. Bibasilar atelectasis and central bronchitic changes. Electronically Signed   By: Ulyses Southward M.D.   On: 04/08/2015 11:03   04/09/15 Echocardiogram Study Conclusions - Left ventricle: The cavity size was normal. Wall thickness  was normal. Systolic function was normal. The estimated ejection fraction was in the range of 60% to 65%. Wall motion was normal; there were no regional wall motion abnormalities. Doppler parameters are consistent with abnormal left ventricular relaxation (grade 1 diastolic dysfunction). - Aortic valve: There was very mild stenosis. There was mild regurgitation. - Left atrium: The atrium was mildly dilated  ASSESSMENT AND PLAN:  1. Symptomatic Bradycardia, 2:1 AV block, 30's-40's     She was on a VM overnight and this morning, currently on n/c at this time and appears comfortable     BP appears stable     We would like to avoid a temp wire in this elderly patient as long as she remains stable while we allow her Metoprolol to wash out.  2. CHF      COPD on chronic O2 at home 3. HTN 4. Anemia 5. CKI   We Farra Nikolic follow, continue care with her primary cardiology team.  Francis Dowseenee Ursuy, PA-C 04/09/2015 11:42 AM  I have seen and examined this patient with Francis Dowseenee Ursuy on 04/09/15.  Agree with above, note added to reflect my findings.  On exam, regular rhythm, bradycardia, no murmurs, lungs clear.  Continued bradycardia.  Emrys Mckamie possibly need pacemaker, but would wait to decide after 5 half lives of metoprolol dosage.    Tabbatha Bordelon M. Dawud Mays MD 04/10/2015 10:21 AM

## 2015-04-10 ENCOUNTER — Inpatient Hospital Stay (HOSPITAL_COMMUNITY): Payer: Medicare Other

## 2015-04-10 DIAGNOSIS — I5031 Acute diastolic (congestive) heart failure: Secondary | ICD-10-CM | POA: Diagnosis present

## 2015-04-10 DIAGNOSIS — R001 Bradycardia, unspecified: Secondary | ICD-10-CM | POA: Diagnosis present

## 2015-04-10 DIAGNOSIS — N179 Acute kidney failure, unspecified: Secondary | ICD-10-CM | POA: Diagnosis present

## 2015-04-10 DIAGNOSIS — I350 Nonrheumatic aortic (valve) stenosis: Secondary | ICD-10-CM

## 2015-04-10 DIAGNOSIS — I10 Essential (primary) hypertension: Secondary | ICD-10-CM | POA: Diagnosis present

## 2015-04-10 DIAGNOSIS — D509 Iron deficiency anemia, unspecified: Secondary | ICD-10-CM | POA: Diagnosis present

## 2015-04-10 LAB — IRON AND TIBC
IRON: 30 ug/dL (ref 28–170)
SATURATION RATIOS: 7 % — AB (ref 10.4–31.8)
TIBC: 459 ug/dL — ABNORMAL HIGH (ref 250–450)
UIBC: 429 ug/dL

## 2015-04-10 LAB — BASIC METABOLIC PANEL
ANION GAP: 9 (ref 5–15)
BUN: 19 mg/dL (ref 6–20)
CALCIUM: 8.9 mg/dL (ref 8.9–10.3)
CHLORIDE: 98 mmol/L — AB (ref 101–111)
CO2: 32 mmol/L (ref 22–32)
Creatinine, Ser: 0.96 mg/dL (ref 0.44–1.00)
GFR calc non Af Amer: 48 mL/min — ABNORMAL LOW (ref >60)
GFR, EST AFRICAN AMERICAN: 56 mL/min — AB (ref >60)
Glucose, Bld: 107 mg/dL — ABNORMAL HIGH (ref 65–99)
Potassium: 3.5 mmol/L (ref 3.5–5.1)
Sodium: 139 mmol/L (ref 135–145)

## 2015-04-10 LAB — RETICULOCYTES
RBC.: 3.75 MIL/uL — ABNORMAL LOW (ref 3.87–5.11)
Retic Count, Absolute: 67.5 10*3/uL (ref 19.0–186.0)
Retic Ct Pct: 1.8 % (ref 0.4–3.1)

## 2015-04-10 LAB — FOLATE: FOLATE: 43 ng/mL (ref >5.9)

## 2015-04-10 LAB — VITAMIN B12: Vitamin B-12: 1880 pg/mL — ABNORMAL HIGH (ref 180–914)

## 2015-04-10 MED ORDER — POTASSIUM CHLORIDE 20 MEQ/15ML (10%) PO SOLN
40.0000 meq | Freq: Once | ORAL | Status: AC
  Administered 2015-04-10: 40 meq via ORAL
  Filled 2015-04-10: qty 30

## 2015-04-10 MED ORDER — DOCUSATE SODIUM 100 MG PO CAPS
100.0000 mg | ORAL_CAPSULE | Freq: Every day | ORAL | Status: DC
  Administered 2015-04-10 – 2015-04-13 (×4): 100 mg via ORAL
  Filled 2015-04-10 (×4): qty 1

## 2015-04-10 MED ORDER — FUROSEMIDE 10 MG/ML IJ SOLN
40.0000 mg | Freq: Two times a day (BID) | INTRAMUSCULAR | Status: AC
  Administered 2015-04-10 (×2): 40 mg via INTRAVENOUS
  Filled 2015-04-10 (×2): qty 4

## 2015-04-10 MED ORDER — POLYSACCHARIDE IRON COMPLEX 150 MG PO CAPS
150.0000 mg | ORAL_CAPSULE | Freq: Every day | ORAL | Status: DC
  Administered 2015-04-10 – 2015-04-14 (×4): 150 mg via ORAL
  Filled 2015-04-10 (×5): qty 1

## 2015-04-10 MED ORDER — POTASSIUM CHLORIDE 20 MEQ PO PACK
40.0000 meq | PACK | Freq: Once | ORAL | Status: DC
  Filled 2015-04-10 (×2): qty 2

## 2015-04-10 NOTE — Clinical Documentation Improvement (Addendum)
Cardiology  Can the diagnosis of CHF be further specified?    Type - Systolic, Diastolic, Systolic and Diastolic  Other  Clinically Undetermined  Document any associated diagnoses/conditions  Supporting Information: - Hx HTN - CXR 04/08/15 Enlargement of cardiac silhouette with pulmonary vascular congestion. - Echo 11/10/6 Study Conclusions - Left ventricle: The cavity size was normal. Wall thickness was normal. Systolic function was normal. The estimated ejection fraction was in the range of 60% to 65%. Wall motion was normal; there were no regional wall motion abnormalities. Doppler parameters are consistent with abnormal left ventricular relaxation (grade 1 diastolic dysfunction). - Aortic valve: There was very mild stenosis. There was mild regurgitation. - Left atrium: The atrium was mildly &dilated.  Please exercise your independent, professional judgment when responding. A specific answer is not anticipated or expected.   Thank You,  Saul FordyceSalena A Morgan Health Information Management [place] [phone]   Acute diastolic congestive heart failure - chart updated.  Chrystie NoseKenneth C. Kataleyah Carducci, MD, [facility] Attending Cardiologist [facility]

## 2015-04-10 NOTE — Progress Notes (Addendum)
    Subjective:  More short of breath today. Now on ventimask - off the mast, sats dropped to 86%. HR remains in the 40's. Labs yesterday confirmed an iron-deficient anemia.  Objective:  Vital Signs in the last 24 hours: Temp:  [97.5 F (36.4 C)-98.7 F (37.1 C)] 98.1 F (36.7 C) (11/12 0705) Pulse Rate:  [36-51] 39 (11/12 0705) Resp:  [20-29] 22 (11/12 0705) BP: (115-155)/(32-59) 144/48 mmHg (11/12 0705) SpO2:  [78 %-97 %] 97 % (11/12 0705) FiO2 (%):  [45 %-50 %] 50 % (11/12 0327) Weight:  [145 lb 14.6 oz (66.184 kg)] 145 lb 14.6 oz (66.184 kg) (11/12 0327)  Intake/Output from previous day: 11/11 0701 - 11/12 0700 In: 1010 [P.O.:1010] Out: 1390 [Urine:1390]  Physical Exam: General: elderly Caucasian female, resting in bed, on ventimask HEENT: EOMI, no scleral icterus Cardiac: bradycardic, 3/6 early peaking systolic ejection murmur best heard at the RUSB Pulm: clear to auscultation bilaterally in the anterior lung fields Abd: soft, nontender, nondistended, BS present Ext: warm and well perfused, no pedal edema Neuro: responds to questions appropriately; moving all extremities freely  Lab Results:  Recent Labs  04/08/15 1108 04/09/15 0535  WBC 6.6 6.2  HGB 9.4* 8.6*  PLT 247 238    Recent Labs  04/09/15 0535 04/10/15 0238  NA 141 139  K 3.9 3.5  CL 101 98*  CO2 29 32  GLUCOSE 96 107*  BUN 25* 19  CREATININE 1.05* 0.96    Recent Labs  04/08/15 1108  TROPONINI <0.03    Cardiac Studies: Echo pending  Tele: HR 30s-40s, 2:1 AV block. Occasional PVCs.  Assessment/Plan:  [name] is a 81 year old female with HTN, COPD [2.5L O2], CHF who presented with pre-syncope found to have acute on chronic CHF and 2:1 heart block.  2:1 heart block: Suspect she may have underlying structural disease with EKG findings this morning notable for RBBB and possibly left fascicular block given left axis deviation. Metoprolol is contributory. EP consulted who deferred  pacemaker placement for now to allow for complete washout of the medication.  -Continue holding metoprolol and reassessing her HR on telemetry - remains in heart block -Echo shows EF 60-65%, mild AS and AI, mildly dilated LA, mild diastolic dysfunction -washout metoprolol, possible pacer early next week  Acute on chronic CHF: BNP 951.7 on admission with weight 149 lbs. Given Lasix 40mg  IV and now 145 lbs this AM. -2L negative. Still with Ventimask requirement. Give additional IV lasix this morning. Check CXR.  Hypokalemia: K+ 3.5 this am, will change to liquid potassium 40 MEQ  HTN: Blood pressure trending 110-130s/30s-50s. Home meds include Norvasc 5mg , Diovan 150mg . -Continue Norvasc and irbesartan 150mg   COPD: Continue home oxygen and albuterol.  Elevated creatinine: Crt 1.4 on admission, now resolving. Unclear baseline. -Continue following.  Normocytic anemia: Hb 9.4 on admission though now 8.6 this AM. MCV in the 80s which is less consistent with B12/folate deficiency. Ferritin 15 which is low normal and less consistent with anemia of chronic disease.   -Iron stores are low - start iron and colace  Updated family members via phone and grand-daughter at the bedside.  Summer NoseKenneth C. Niang Mitcheltree, MD, [facility] Attending Cardiologist [facility]   04/10/2015, 8:41 AM

## 2015-04-10 NOTE — Clinical Documentation Improvement (Addendum)
Cardiology    Please help by clarifying the specific medical condition related to the below abnormal laboratory findings.  Possible Clinical Conditions associated with below indicators  - Acute Renal Failure  - Acute Kidney Injury - Other Condition - Cannot Clinically Determine  Supporting Information: Component Creatinine  Latest Ref Rng 0.44 - 1.00 mg/dL  82/95/621311/02/2015 0.861.37 (H)  57/84/696211/03/2015 1.05 (H)  04/10/2015 0.96    Treatment Provided: - Lasix IV 40mg  Q12hours   Please exercise your independent, professional judgment when responding. A specific answer is not anticipated or expected.   Thank you for your time with this!   Servando SnareSalena Morgan, RN Clinical Documentation Improvement Specialist (CDIS) Health Information Management [place] [phone] / [phone]  Acute renal failure.  Chrystie NoseKenneth C. Hilty, MD, [facility] Attending Cardiologist [facility]

## 2015-04-11 DIAGNOSIS — I10 Essential (primary) hypertension: Secondary | ICD-10-CM

## 2015-04-11 DIAGNOSIS — D509 Iron deficiency anemia, unspecified: Secondary | ICD-10-CM

## 2015-04-11 DIAGNOSIS — R001 Bradycardia, unspecified: Secondary | ICD-10-CM

## 2015-04-11 LAB — BASIC METABOLIC PANEL
ANION GAP: 7 (ref 5–15)
BUN: 18 mg/dL (ref 6–20)
CALCIUM: 8.9 mg/dL (ref 8.9–10.3)
CO2: 33 mmol/L — AB (ref 22–32)
Chloride: 98 mmol/L — ABNORMAL LOW (ref 101–111)
Creatinine, Ser: 1.21 mg/dL — ABNORMAL HIGH (ref 0.44–1.00)
GFR calc non Af Amer: 36 mL/min — ABNORMAL LOW (ref >60)
GFR, EST AFRICAN AMERICAN: 42 mL/min — AB (ref >60)
GLUCOSE: 100 mg/dL — AB (ref 65–99)
POTASSIUM: 4.5 mmol/L (ref 3.5–5.1)
Sodium: 138 mmol/L (ref 135–145)

## 2015-04-11 MED ORDER — FUROSEMIDE 20 MG PO TABS: 20.0000 mg | ORAL_TABLET | Freq: Every day | ORAL | Status: DC

## 2015-04-11 NOTE — Progress Notes (Signed)
    Subjective:  Sleeping this AM.   Objective:  Vital Signs in the last 24 hours: Temp:  [97.3 F (36.3 C)-98.7 F (37.1 C)] 97.3 F (36.3 C) (11/13 0730) Pulse Rate:  [38-48] 41 (11/13 0730) Resp:  [13-24] 14 (11/13 0730) BP: (102-142)/(40-102) 121/49 mmHg (11/13 0730) SpO2:  [90 %-99 %] 99 % (11/13 0730) Weight:  [142 lb 13.7 oz (64.8 kg)] 142 lb 13.7 oz (64.8 kg) (11/13 0325)  Intake/Output from previous day: 11/12 0701 - 11/13 0700 In: 120 [P.O.:120] Out: 885 [Urine:885]  Physical Exam: General: elderly Caucasian female, resting in bed, 5LO2 by nasal cannula HEENT: EOMI, no scleral icterus, no JVD Cardiac: bradycardic, 3/6 early peaking systolic ejection murmur best heard at the RUSB Pulm: decreased breath sounds at left base Abd: soft, nontender, nondistended, BS present Ext: warm and well perfused, no pedal or tibial edema Neuro: responds to questions appropriately; moving all extremities freely  Lab Results:  Recent Labs  04/08/15 1108 04/09/15 0535  WBC 6.6 6.2  HGB 9.4* 8.6*  PLT 247 238    Recent Labs  04/10/15 0238 04/11/15 0350  NA 139 138  K 3.5 4.5  CL 98* 98*  CO2 32 33*  GLUCOSE 107* 100*  BUN 19 18  CREATININE 0.96 1.21*    Recent Labs  04/08/15 1108  TROPONINI <0.03    Cardiac Studies: EF 60-65% with grade 1 diastolic dysfunction, mild aortic stenosis, mild aortic regurgitation, mild LA dilatation.   Tele: HR 30s-40s, 2:1 AV block. Stable from yesterday.  Assessment/Plan:  Ms. Summer Graham is a 81 year old female with HTN, COPD [2.5L O2], CHF who presented with pre-syncope found to have acute on chronic CHF and 2:1 heart block.  2:1 heart block: Suspect she may have underlying structural disease with EKG findings notable for RBBB and possibly left fascicular block given left axis deviation. Echo yesterday with grade 1 diastolic dysfunction though no overt structural disease. Metoprolol contributory. EP consulted who deferred pacemaker  placement for now to allow for complete washout of the medication though now Day 3 with minimally improved HR. -Reconsult EP for pacemaker placement -Make NPO tonight.  Acute on chronic diastolic CHF: BNP 951.7 on admission with weight 149 lbs though now down to 142 lbs.  Given Lasix 40mg  IV and now 145 lbs this AM. Oxygen requirement improved. -Encouraged patient to use incentive spirometry -Hold diuresis today and resume Lasix 20mg  PO tomorrow.  Hypokalemia: K 4.5 this AM. Resolved.  HTN: Blood pressure trending 110-130s/30s-50s. Home meds include Norvasc 5mg , Diovan 150mg . -Continue Norvasc and irbesartan 150mg   COPD: Continue oxygen and albuterol.  Chronic kidney disease Stage 3: Crt 1.0-1.4 this admission, consistent with GFR 30-60 though increased yesterday 0.9->1.2 likely with Lasix IV. -Continue following.  Iron deficiency anemia: Hb 8-9 on admission, MCV in the 80s. Low normal ferritin, low normal Fe, elevated TIBC consistent with Fe deficiency. -Continue Niferix and colace   Heywood Ilesushil Patel, PGY2 Internal Medicine Pager: [phone]   04/11/2015, 8:00 AM

## 2015-04-12 ENCOUNTER — Encounter (HOSPITAL_COMMUNITY)
Admission: EM | Disposition: A | Discharge status: Skilled Nursing Facility | Payer: Medicare Other | Source: Home / Self Care | Attending: Cardiovascular Disease

## 2015-04-12 DIAGNOSIS — I441 Atrioventricular block, second degree: Secondary | ICD-10-CM

## 2015-04-12 DIAGNOSIS — N183 Chronic kidney disease, stage 3 (moderate): Secondary | ICD-10-CM

## 2015-04-12 DIAGNOSIS — R55 Syncope and collapse: Secondary | ICD-10-CM

## 2015-04-12 DIAGNOSIS — I5033 Acute on chronic diastolic (congestive) heart failure: Secondary | ICD-10-CM

## 2015-04-12 HISTORY — PX: EP IMPLANTABLE DEVICE: SHX172B

## 2015-04-12 LAB — BASIC METABOLIC PANEL
ANION GAP: 11 (ref 5–15)
BUN: 21 mg/dL — ABNORMAL HIGH (ref 6–20)
CALCIUM: 9.3 mg/dL (ref 8.9–10.3)
CO2: 31 mmol/L (ref 22–32)
Chloride: 95 mmol/L — ABNORMAL LOW (ref 101–111)
Creatinine, Ser: 1.25 mg/dL — ABNORMAL HIGH (ref 0.44–1.00)
GFR, EST AFRICAN AMERICAN: 40 mL/min — AB (ref >60)
GFR, EST NON AFRICAN AMERICAN: 35 mL/min — AB (ref >60)
GLUCOSE: 101 mg/dL — AB (ref 65–99)
POTASSIUM: 4.6 mmol/L (ref 3.5–5.1)
Sodium: 137 mmol/L (ref 135–145)

## 2015-04-12 LAB — PROTIME-INR
INR: 1.16 (ref 0.00–1.49)
PROTHROMBIN TIME: 15 s (ref 11.6–15.2)

## 2015-04-12 SURGERY — PACEMAKER IMPLANT

## 2015-04-12 MED ORDER — CEFAZOLIN SODIUM 1-5 GM-% IV SOLN
1.0000 g | Freq: Four times a day (QID) | INTRAVENOUS | Status: AC
  Administered 2015-04-12 – 2015-04-13 (×3): 1 g via INTRAVENOUS
  Filled 2015-04-12 (×3): qty 50

## 2015-04-12 MED ORDER — FENTANYL CITRATE (PF) 100 MCG/2ML IJ SOLN
INTRAMUSCULAR | Status: DC | PRN
  Administered 2015-04-12: 12.5 ug via INTRAVENOUS

## 2015-04-12 MED ORDER — FUROSEMIDE 20 MG PO TABS
20.0000 mg | ORAL_TABLET | Freq: Every day | ORAL | Status: DC
  Administered 2015-04-13 – 2015-04-14 (×2): 20 mg via ORAL
  Filled 2015-04-12 (×2): qty 1

## 2015-04-12 MED ORDER — FENTANYL CITRATE (PF) 100 MCG/2ML IJ SOLN
INTRAMUSCULAR | Status: AC
  Filled 2015-04-12: qty 4

## 2015-04-12 MED ORDER — MIDAZOLAM HCL 5 MG/5ML IJ SOLN
INTRAMUSCULAR | Status: AC
  Filled 2015-04-12: qty 25

## 2015-04-12 MED ORDER — SODIUM CHLORIDE 0.9 % IV SOLN
INTRAVENOUS | Status: AC
  Administered 2015-04-12: 16:00:00 via INTRAVENOUS

## 2015-04-12 MED ORDER — CHLORHEXIDINE GLUCONATE 4 % EX LIQD
60.0000 mL | Freq: Once | CUTANEOUS | Status: AC
  Administered 2015-04-12: 4 via TOPICAL

## 2015-04-12 MED ORDER — LIDOCAINE HCL (PF) 1 % IJ SOLN
INTRAMUSCULAR | Status: AC
  Filled 2015-04-12: qty 60

## 2015-04-12 MED ORDER — ONDANSETRON HCL 4 MG/2ML IJ SOLN: 4.0000 mg | Freq: Four times a day (QID) | INTRAMUSCULAR | Status: DC | PRN

## 2015-04-12 MED ORDER — CHLORHEXIDINE GLUCONATE 4 % EX LIQD: 60.0000 mL | Freq: Once | CUTANEOUS | Status: AC

## 2015-04-12 MED ORDER — YOU HAVE A PACEMAKER BOOK
Freq: Once | Status: AC
  Administered 2015-04-12: 11:00:00
  Filled 2015-04-12: qty 1

## 2015-04-12 MED ORDER — SODIUM CHLORIDE 0.9 % IV SOLN
INTRAVENOUS | Status: DC
  Administered 2015-04-12: 13:00:00 via INTRAVENOUS

## 2015-04-12 MED ORDER — CHLORHEXIDINE GLUCONATE 4 % EX LIQD
CUTANEOUS | Status: AC
  Administered 2015-04-12: 4 via TOPICAL
  Filled 2015-04-12: qty 15

## 2015-04-12 MED ORDER — SODIUM CHLORIDE 0.9 % IV SOLN: INTRAVENOUS | Status: DC

## 2015-04-12 MED ORDER — SODIUM CHLORIDE 0.9 % IR SOLN
Status: AC
  Filled 2015-04-12: qty 2

## 2015-04-12 MED ORDER — CEFAZOLIN SODIUM-DEXTROSE 2-3 GM-% IV SOLR
INTRAVENOUS | Status: AC
  Filled 2015-04-12: qty 50

## 2015-04-12 MED ORDER — SODIUM CHLORIDE 0.9 % IV SOLN
INTRAVENOUS | Status: DC | PRN
  Administered 2015-04-12: 10 mL/h via INTRAVENOUS

## 2015-04-12 MED ORDER — SODIUM CHLORIDE 0.9 % IR SOLN
80.0000 mg | Status: DC
  Filled 2015-04-12: qty 2

## 2015-04-12 MED ORDER — CEFAZOLIN SODIUM-DEXTROSE 2-3 GM-% IV SOLR
2.0000 g | INTRAVENOUS | Status: AC
  Administered 2015-04-12: 2 g via INTRAVENOUS
  Filled 2015-04-12: qty 50

## 2015-04-12 MED ORDER — ACETAMINOPHEN 325 MG PO TABS
325.0000 mg | ORAL_TABLET | ORAL | Status: DC | PRN
  Administered 2015-04-13: 650 mg via ORAL
  Filled 2015-04-12: qty 2

## 2015-04-12 MED ORDER — LIDOCAINE HCL (PF) 1 % IJ SOLN
INTRAMUSCULAR | Status: DC | PRN
  Administered 2015-04-12: 51 mL

## 2015-04-12 SURGICAL SUPPLY — 9 items
CABLE SURGICAL S-101-97-12 (CABLE) ×6 IMPLANT
ETRINSA 8 DR-T 394931 (Pacemaker) ×3 IMPLANT
KIT ESSENTIALS PG (KITS) IMPLANT
LEAD CAPSURE NOVUS 45CM (Lead) ×3 IMPLANT
LEAD ISOFLEX OPT 1944-46CM (Lead) ×3 IMPLANT
LEAD ISOFLEX OPT 1948-52CM (Lead) ×3 IMPLANT
PAD DEFIB LIFELINK (PAD) ×3 IMPLANT
SHEATH CLASSIC 7F (SHEATH) ×6 IMPLANT
TRAY PACEMAKER INSERTION (PACKS) ×3 IMPLANT

## 2015-04-12 NOTE — Progress Notes (Signed)
Subjective:  Resting in bed this AM with no complaints. Family at bedside. Placed on Venti mask with desat to the 80s.   Objective:  Vital Signs in the last 24 hours: Temp:  [97.7 F (36.5 C)-98.3 F (36.8 C)] 97.9 F (36.6 C) (11/14 0737) Pulse Rate:  [37-56] 38 (11/14 0646) Resp:  [13-24] 19 (11/14 0646) BP: (88-154)/(41-62) 133/43 mmHg (11/14 0301) SpO2:  [86 %-96 %] 86 % (11/14 0646) FiO2 (%):  [50 %] 50 % (11/14 0646) Weight:  [146 lb 6.2 oz (66.4 kg)] 146 lb 6.2 oz (66.4 kg) (11/14 0301)  Intake/Output from previous day: 11/13 0701 - 11/14 0700 In: 705 [P.O.:705] Out: 1365 [Urine:1365]  Physical Exam: General: elderly Caucasian female, resting in bed, Venti mask HEENT: EOMI, no scleral icterus, no JVD Cardiac: bradycardic, 3/6 early-mid peaking systolic ejection murmur best heard at the RUSB Pulm: clear to auscultation in the anterior lung fields Abd: soft, nontender, nondistended, BS present Ext: warm and well perfused, no pedal or tibial edema Neuro: responds to questions appropriately; moving all extremities freely  Lab Results: No results for input(s): WBC, HGB, PLT in the last 72 hours.  Recent Labs  04/10/15 0238 04/11/15 0350  NA 139 138  K 3.5 4.5  CL 98* 98*  CO2 32 33*  GLUCOSE 107* 100*  BUN 19 18  CREATININE 0.96 1.21*   No results for input(s): TROPONINI in the last 72 hours.  Invalid input(s): CK, MB  Cardiac Studies: EF 60-65% with grade 1 diastolic dysfunction, mild aortic stenosis, mild aortic regurgitation, mild LA dilatation.   Tele: HR 30s-40s, 2:1 AV block. Stable from yesterday.  Assessment/Plan:  Ms. Summer Graham is a 81 year old female with HTN, COPD [2.5L O2], CHF who presented with pre-syncope found to have acute on chronic CHF and 2:1 heart block.  2:1 heart block: Suspect she may have underlying structural disease with EKG findings notable for RBBB and possibly left fascicular block given left axis deviation. Echo yesterday  with grade 1 diastolic dysfunction though no overt structural disease. Metoprolol contributory. EP consulted who deferred pacemaker placement for now to allow for complete washout of the medication though now Day 4 with no improvement in HR. NPO since midnight. -Consult EP for pacemaker placement  Acute on chronic diastolic CHF: BNP 951.7 on admission with weight 149 lbs though now down to 142 lbs. Weight 146 lbs today, up from 142 yesterday which is a bit surprising. -Continue home Lasix  today -BMET pending  HTN: Blood pressure trending 110-140s/40s-50s. Home meds include Norvasc , Diovan . -Continue Norvasc and irbesartan   COPD: Continue oxygen and albuterol.  Chronic kidney disease Stage 3: Crt 1.0-1.4 this admission, consistent with GFR 30-60. BMET pending this AM. -Continue following.  Iron deficiency anemia: Hb 8-9 on admission, MCV in the 80s. Low normal ferritin, low normal Fe, elevated TIBC consistent with Fe deficiency. -Continue Niferix and colace   Heywood Iles, PGY2 Internal Medicine Pager: [phone]   04/12/2015, 7:53 AM   I have examined the patient and reviewed assessment and plan and discussed with patient.  Agree with above as stated.  She is quite functional for her age.  Persistent 2:1 AV block.  She liekly needs PPM as there has been sufficient time for beta blocker washout.  Discussed with EP.  Dr. Graciela Husbands to see later today.  Patient is NPO.  Wrote to hold Lasix today since she may be going for procedure.  She appears euvolemic and is comfortable lying  flat.  Doye Montilla S.

## 2015-04-12 NOTE — Progress Notes (Signed)
SUBJECTIVE: The patient is doing well today.  At this time, she denies chest pain, shortness of breath, or any new concerns.  Marland Kitchen amLODipine  5 mg Oral q morning - 10a  . aspirin EC  81 mg Oral Daily  . celecoxib  100 mg Oral Daily  . citalopram  20 mg Oral QHS  . docusate sodium  100 mg Oral QHS  . enoxaparin (LOVENOX) injection  30 mg Subcutaneous Q24H  . [START ON 04/13/2015] furosemide  20 mg Oral Daily  . irbesartan  150 mg Oral Daily  . iron polysaccharides  150 mg Oral Daily  . levothyroxine  88 mcg Oral QAC breakfast  . loratadine  10 mg Oral Daily  . pantoprazole  40 mg Oral Daily  . sodium chloride  3 mL Intravenous Q12H  . vitamin B-12  1,000 mcg Oral Daily  . you have a pacemaker book   Does not apply Once      OBJECTIVE: Physical Exam: Filed Vitals:   04/12/15 0646 04/12/15 0737 04/12/15 0800 04/12/15 0919  BP:   147/51 157/45  Pulse: 38  43   Temp:  97.9 F (36.6 C)    TempSrc:  Oral    Resp: 19  15   Height:      Weight:      SpO2: 86%  99%     Intake/Output Summary (Last 24 hours) at 04/12/15 1035 Last data filed at 04/12/15 0800  Gross per 24 hour  Intake    400 ml  Output   1065 ml  Net   -665 ml    Telemetry reveals  2:1 AV block, HR 50-60's now, 40's at times  GEN- The patient is well nourished, elderly, alert and oriented x 3 today.   Head- normocephalic, atraumatic Eyes-  Sclera clear, conjunctiva pink Ears- hearing is chronically diminished Neck- supple, no JVP Lungs- Clear to ausculation bilaterally, normal work of breathing Heart- Regular rate and rhythm, no significant murmurs, no rubs or gallops GI- soft, NT, ND, + BS Extremities- no clubbing, cyanosis, or edema Skin- no rash or lesion Psych- euthymic mood, full affect Neuro- no gross deficits appreciated  LABS: Basic Metabolic Panel:  Recent Labs  40/98/11 0238 04/11/15 0350  NA 139 138  K 3.5 4.5  CL 98* 98*  CO2 32 33*  GLUCOSE 107* 100*  BUN 19 18  CREATININE  0.96 1.21*  CALCIUM 8.9 8.9   Anemia Panel:  Recent Labs  04/10/15 0238  VITAMINB12 1880*  FOLATE 43.0  TIBC 459*  IRON 30  RETICCTPCT 1.8   04/09/15: Echocardiogram Study Conclusions - Left ventricle: The cavity size was normal. Wall thickness was normal. Systolic function was normal. The estimated ejection fraction was in the range of 60% to 65%. Wall motion was normal; there were no regional wall motion abnormalities. Doppler parameters are consistent with abnormal left ventricular relaxation (grade 1 diastolic dysfunction). - Aortic valve: There was very mild stenosis. There was mild regurgitation. - Left atrium: The atrium was mildly dilated.  RADIOLOGY: Dg Chest Port 1 View 04/10/2015  CLINICAL DATA:  Dyspnea.  Severe shortness of breath today. EXAM: PORTABLE CHEST 1 VIEW COMPARISON:  04/08/2015 FINDINGS: Lung volumes are low. Mild opacity at the right lung base is likely atelectasis. No convincing pneumonia and no pulmonary edema. The cardiac silhouette is mildly enlarged. The aorta is uncoiled. No obvious pleural effusion.  No pneumothorax. IMPRESSION: 1. No acute cardiopulmonary disease. No significant change from the prior study.  Electronically Signed   By: Amie Portlandavid  Ormond M.D.   On: 04/10/2015 09:21    ASSESSMENT AND PLAN:  Principal Problem:   Heart block AV second degree Active Problems:   Bradycardia   Acute diastolic CHF (congestive heart failure) (HCC)   Essential hypertension   Acute renal failure (HCC)   Iron deficiency anemia  1. Bradycardia, 2:1 AVblock     Off Metoprolol now since Thursday (last dose was Thursday AM)     HR is improved but remains in 2:1 AVBlock     Family at bedside and though she lives in an ALF she is still fairly independent requiring little support,/help from staff, she ambulates with a walker.     Her BP appears stable     d/w family and patient, would like to consider PPM for her despite her advanced age, described  as fairly active.     Will make final POC with Dr. Graciela HusbandsKlein, will hold NPO  2. CHF     Continue with primary cardiology team     No on PO diuretic     Appears euvolemic on exam, last CXR 3. COPD     On VM this morning, on n/c O2 now sats in the 90's     On home O2       Francis DowseRenee Ursuy, PA-C 04/12/2015 10:35 AM   Reviewing hx  Prolonged unconsciousness following micturition suggests vasomotor component and raises the spectre that the primary event was neurally mediated and not primarily arrhythmic.  Still w persistent heart block it is reasonable to pace  Given the other possibility we will use CLS   She may have significant orthostatic intolerance and will review strategies following reassessment post implant  The benefits and risks were reviewed including but not limited to death,  perforation, infection, lead dislodgement and device malfunction.  The patient understands agrees and is willing to proceed.

## 2015-04-12 NOTE — Progress Notes (Signed)
Patient transferred from the cath lab. Assessment done, vitals stable. Tele box placed, CCMD called. Will continue to monitor.

## 2015-04-12 NOTE — Progress Notes (Signed)
Orthopedic Tech Progress Note Patient Details:  Summer ProvencalBlanche Graham 1915/08/04 [id number]  Ortho Devices Type of Ortho Device: Arm sling   Saul FordyceJennifer C Ashiyah Pavlak 04/12/2015, 4:52 PM

## 2015-04-12 NOTE — Care Management Important Message (Signed)
Important Message  Patient Details  Name: Summer Graham MRN: [id number] Date of Birth: [birth year]   Medicare Important Message Given:  Yes    Gardner Servantes P Maija Biggers 04/12/2015, 1:29 PM

## 2015-04-13 ENCOUNTER — Encounter (HOSPITAL_COMMUNITY): Payer: Self-pay | Admitting: Internal Medicine

## 2015-04-13 ENCOUNTER — Inpatient Hospital Stay (HOSPITAL_COMMUNITY): Payer: Medicare Other

## 2015-04-13 LAB — BASIC METABOLIC PANEL
Anion gap: 9 (ref 5–15)
BUN: 16 mg/dL (ref 6–20)
CHLORIDE: 99 mmol/L — AB (ref 101–111)
CO2: 29 mmol/L (ref 22–32)
Calcium: 8.8 mg/dL — ABNORMAL LOW (ref 8.9–10.3)
Creatinine, Ser: 1.03 mg/dL — ABNORMAL HIGH (ref 0.44–1.00)
GFR calc non Af Amer: 44 mL/min — ABNORMAL LOW (ref >60)
GFR, EST AFRICAN AMERICAN: 51 mL/min — AB (ref >60)
Glucose, Bld: 99 mg/dL (ref 65–99)
POTASSIUM: 4.4 mmol/L (ref 3.5–5.1)
SODIUM: 137 mmol/L (ref 135–145)

## 2015-04-13 LAB — CBC
HEMATOCRIT: 31.7 % — AB (ref 36.0–46.0)
Hemoglobin: 9.7 g/dL — ABNORMAL LOW (ref 12.0–15.0)
MCH: 24.7 pg — AB (ref 26.0–34.0)
MCHC: 30.6 g/dL (ref 30.0–36.0)
MCV: 80.9 fL (ref 78.0–100.0)
Platelets: 249 10*3/uL (ref 150–400)
RBC: 3.92 MIL/uL (ref 3.87–5.11)
RDW: 16.3 % — ABNORMAL HIGH (ref 11.5–15.5)
WBC: 6.7 10*3/uL (ref 4.0–10.5)

## 2015-04-13 MED FILL — Gentamicin Sulfate Inj 40 MG/ML: INTRAMUSCULAR | Qty: 2 | Status: AC

## 2015-04-13 MED FILL — Sodium Chloride Irrigation Soln 0.9%: Qty: 500 | Status: AC

## 2015-04-13 NOTE — Evaluation (Signed)
Physical Therapy Evaluation Patient Details Name: Nick Armel MRN: [id number] DOB: 1915-08-29 Today's Date: 04/13/2015   History of Present Illness  Patient is a 81 y/o female with hx of HTN, COPD, and CHF presents with presyncopal episode. In ED, HR has been 34 -72 bpm. BNP was found to be elevated to 951.CXR- pulmonary vascular congestion along with bibasilar atelectasis and bronchitic changes. EKG showing 2:1 heart block. s/p pacemaker placement.  Clinical Impression  Patient presents with generalized weakness, deconditioning, decreased endurance and DOE impacting mobility. Requires Max A standing due to inability to use LUE and weakness. Able to state precautions due to pacemaker insertion. HR stable. Tolerated short distance ambulation with Min A for safety/support. Would benefit from ST SNF at discharge to maximize independence and mobility and ease burden of care prior to return to [facility]. Will follow acutely.    Follow Up Recommendations SNF    Equipment Recommendations  None recommended by PT    Recommendations for Other Services       Precautions / Restrictions Precautions Precautions: ICD/Pacemaker Precaution Comments: monitor 02 Restrictions Weight Bearing Restrictions: No      Mobility  Bed Mobility Overal bed mobility: Needs Assistance Bed Mobility: Sit to Supine       Sit to supine: Min guard   General bed mobility comments: Able to bring BLEs into bed without assist. Requires Max-Total A to scoot up in bed.  Transfers Overall transfer level: Needs assistance Equipment used: Rolling walker (2 wheeled) Transfers: Sit to/from Stand Sit to Stand: Max assist         General transfer comment: Max A to boost from low recliner wtih cues for hand placement as pt not able to use LUE to assist.   Ambulation/Gait Ambulation/Gait assistance: Min assist Ambulation Distance (Feet): 20 Feet Assistive device: Rolling walker (2 wheeled) Gait  Pattern/deviations: Step-through pattern;Decreased stride length;Trunk flexed Gait velocity: decreased   General Gait Details: Slow, unsteady gait. Dyspnea present 3/4. Sp02 dropped to 81% on 3L/min 02. Cues for pursed lip breathing. 2 standing rest breaks.  Stairs            Wheelchair Mobility    Modified Rankin (Stroke Patients Only)       Balance Overall balance assessment: Needs assistance Sitting-balance support: Feet supported;No upper extremity supported Sitting balance-Leahy Scale: Fair     Standing balance support: During functional activity;Bilateral upper extremity supported Standing balance-Leahy Scale: Poor Standing balance comment: Relient onR W for support.                              Pertinent Vitals/Pain Pain Assessment: No/denies pain    Home Living Family/patient expects to be discharged to:: Skilled nursing facility                      Prior Function Level of Independence: Needs assistance   Gait / Transfers Assistance Needed: Reports getting escorted to dining hall either via motorized w/c or manual w/c. Does ambulation with RW within room.  ADL's / Homemaking Assistance Needed: Reports independent for dressing but assist with bathing.        Hand Dominance   Dominant Hand: Right    Extremity/Trunk Assessment   Upper Extremity Assessment: Defer to OT evaluation           Lower Extremity Assessment: Generalized weakness (Audible grinding and arthritic changes in bil knees noted during ambulation)      Cervical /  Trunk Assessment: Kyphotic  Communication   Communication: HOH  Cognition Arousal/Alertness: Awake/alert Behavior During Therapy: WFL for tasks assessed/performed Overall Cognitive Status: Within Functional Limits for tasks assessed                      General Comments General comments (skin integrity, edema, etc.): Family stepped out for PT evaluation.    Exercises         Assessment/Plan    PT Assessment Patient needs continued PT services  PT Diagnosis Difficulty walking;Generalized weakness   PT Problem List Decreased strength;Cardiopulmonary status limiting activity;Decreased activity tolerance;Decreased balance;Decreased mobility  PT Treatment Interventions Balance training;Gait training;Patient/family education;Functional mobility training;Therapeutic activities;Therapeutic exercise   PT Goals (Current goals can be found in the Care Plan section) Acute Rehab PT Goals Patient Stated Goal: to get better PT Goal Formulation: With patient Time For Goal Achievement: 04/27/15 Potential to Achieve Goals: Fair    Frequency Min 2X/week   Barriers to discharge Decreased caregiver support      Co-evaluation               End of Session Equipment Utilized During Treatment: Gait belt;Oxygen Activity Tolerance: Treatment limited secondary to medical complications (Comment) (drop in Sp02) Patient left: in bed;with call bell/phone within reach;with bed alarm set Nurse Communication: Mobility status;Precautions         Time: 1405-1430 PT Time Calculation (min) (ACUTE ONLY): 25 min   Charges:   PT Evaluation $Initial PT Evaluation Tier I: 1 Procedure PT Treatments $Gait Training: 8-22 mins   PT G Codes:        Breanda Greenlaw A Payten Hobin 04/13/2015, 2:51 PM Mylo RedShauna Barrett Holthaus, PT, DPT [phone]

## 2015-04-13 NOTE — Progress Notes (Signed)
Doing well post pacemaker Wound without hematoma CXR without ptx Device interrogation reviewed and normal Ok from EP standpoint to DC home. Instructions reviewed with patient Follow up appointments made  Gypsy BalsamAmber Seiler, NP 04/13/2015 11:51 AM  I have seen and examined this patient with Gypsy BalsamAmber Seiler.  Agree with above, note added to reflect my findings.  On exam, regular rhythm, no murmurs, lungs clear.  Had pacemaker placed yesterday.  Pacemaker check today shows lead parameters unchanged.  CXR shows no change in lead placement and no pneumothorax.  Ok to [employer] from EP standpoint, follow up EP appointments made.    Tashia Leiterman M. Anah Billard MD 04/13/2015 3:51 PM

## 2015-04-13 NOTE — Clinical Social Work Note (Signed)
Clinical Social Work Assessment  Patient Details  Name: Summer Graham MRN: [id number] Date of Birth: 10-Jan-1916  Date of referral:  04/13/15               Reason for consult:  Facility Placement                Permission sought to share information with:  Facility Medical sales representativeContact Representative, Family Supports Permission granted to share information::  Yes, Verbal Permission Granted  Name::     Bunnie PhilipsDanny Tsui  Agency::  [facility]  Relationship::  Son  Contact Information:  [phone]  Housing/Transportation Living arrangements for the past 2 months:  Skilled Nursing Facility Source of Information:  Adult Children, Medical Team Patient Interpreter Needed:  None Criminal Activity/Legal Involvement Pertinent to Current Situation/Hospitalization:  No - Comment as needed Significant Relationships:  Adult Children Lives with:  Facility Resident Do you feel safe going back to the place where you live?  Yes Need for family participation in patient care:  Yes (Comment)  Care giving concerns:  Patient son Bunnie Philips(Danny Wrightson) understands that patient needs short term rehab at a SNF facility.    Social Worker assessment / plan:  BSW intern unable to assess patient. BSW intern completed assessment with patient son by phone. Patient son Bunnie Philips(Danny Barwick) confirmed that [facility]. Patient son also stated that the plan is [facility]. Patient son has been in communication with the facility about his mother. Patient son is hopeful that patient will be discharged tomorrow before lunch because he will be going out of town tomorrow afternoon. Social worker will continue to follow and assist as needed.  Employment status:  Retired Health and safety inspectornsurance information:  Medicare PT Recommendations:  Skilled Nursing Facility Information / Referral to community resources:  Skilled Nursing Facility  Patient/Family's Response to care:  Patient son is very  Adult nurseappreciative and thankful for the care the patient is receiving at both locations. Patient son stated, "You couldn't ask for better care."  Patient/Family's Understanding of and Emotional Response to Diagnosis, Current Treatment, and Prognosis:  Patient son understands the patient's current treatment and post discharge needs. Patient son is very appreciative and hopeful that the patient will return to the facility soon.   Emotional Assessment Appearance:  Other (Comment Required (Unable to assess) Attitude/Demeanor/Rapport:  Unable to Assess Affect (typically observed):  Unable to Assess Orientation:   (Unable to assess) Alcohol / Substance use:  Not Applicable Psych involvement (Current and /or in the community):  No (Comment)  Discharge Needs  Concerns to be addressed:  No discharge needs identified Readmission within the last 30 days:  No Current discharge risk:  None Barriers to Discharge:  Continued Medical Work up    FiservMaggie Bentleigh Stankus BSW Intern, [phone]

## 2015-04-13 NOTE — [place] FL2 (Signed)
[place] MEDICAID FL2 LEVEL OF CARE SCREENING TOOL     IDENTIFICATION  Patient Name: Summer Graham Birthdate: 09-28-1915 Sex: female Admission Date (Current Location): 04/08/2015  [facility] and [place] Number: Producer, television/film/video and Address:  [facility], [address] [place], [place] [address]      Provider Number: [id number]  Attending Physician Name and Address:  Runell Gess, MD  Relative Name and Phone Number:  Kayli Beal [phone])    Current Level of Care: Hospital Recommended Level of Care: Skilled Nursing Facility Prior Approval Number:    Date Approved/Denied:   PASRR Number:    Discharge Plan: SNF    Current Diagnoses: Patient Active Problem List   Diagnosis Date Noted  . Near syncope   . Acute diastolic CHF (congestive heart failure) (HCC) 04/10/2015  . Essential hypertension 04/10/2015  . Acute renal failure (HCC) 04/10/2015  . Iron deficiency anemia 04/10/2015  . Bradycardia   . Heart block AV second degree 04/08/2015    Orientation ACTIVITIES/SOCIAL BLADDER RESPIRATION     (Unable to assess)  Active Continent O2 (As needed) (3L)  BEHAVIORAL SYMPTOMS/MOOD NEUROLOGICAL BOWEL NUTRITION STATUS      Incontinent Diet (Heart Healthy)  PHYSICIAN VISITS COMMUNICATION OF NEEDS Height & Weight Skin    Verbally  (157.5 cm) 142 lbs. Normal          AMBULATORY STATUS RESPIRATION    Assist independent O2 (As needed) (3L)      Personal Care Assistance Level of Assistance  Bathing, Dressing Bathing Assistance: Limited assistance   Dressing Assistance: Limited assistance      Functional Limitations Info  Hearing   Hearing Info:  (Hard of hearing)         SPECIAL CARE FACTORS FREQUENCY  PT (By licensed PT)     PT Frequency: 5x/week             Additional Factors Info  Code Status, Allergies Code Status Info: FULL CODE Allergies Info: NKDA           Current Medications  (04/13/2015): Current Facility-Administered Medications  Medication Dose Route Frequency Provider Last Rate Last Dose  . 0.9 %  sodium chloride infusion  250 mL Intravenous PRN Ellsworth Lennox, [place]      . acetaminophen (TYLENOL) tablet 325-650 mg  325-650 mg Oral Q4H PRN Duke Salvia, MD   650 mg at 04/13/15 0006  . albuterol (PROVENTIL) (2.5 MG/3ML) 0.083% nebulizer solution 2.5 mg  2.5 mg Nebulization Q6H PRN Ellsworth Lennox, PA      . amLODipine (NORVASC) tablet 5 mg  5 mg Oral q morning - 10a Ellsworth Lennox, PA   5 mg at 04/13/15 1001  . aspirin EC tablet 81 mg  81 mg Oral Daily Ellsworth Lennox, [place]   81 mg at 04/13/15 1002  . celecoxib (CELEBREX) capsule 100 mg  100 mg Oral Daily Ellsworth Lennox, [place]   100 mg at 04/13/15 1002  . citalopram (CELEXA) tablet 20 mg  20 mg Oral QHS Ellsworth Lennox, [place]   20 mg at 04/12/15 2121  . docusate sodium (COLACE) capsule 100 mg  100 mg Oral QHS Chrystie Nose, MD   100 mg at 04/12/15 2121  . furosemide (LASIX) tablet 20 mg  20 mg Oral Daily Corky Crafts, MD   20 mg at 04/13/15 1001  . irbesartan (AVAPRO) tablet 150 mg  150 mg Oral Daily Ellsworth Lennox,  PA   150 mg at 04/13/15 1002  . iron polysaccharides (NIFEREX) capsule 150 mg  150 mg Oral Daily Chrystie NoseKenneth C Hilty, MD   150 mg at 04/13/15 1001  . levothyroxine (SYNTHROID, LEVOTHROID) tablet 88 mcg  88 mcg Oral QAC breakfast Ellsworth LennoxBrittany M Strader, [place]   88 mcg at 04/13/15 16100613  . loratadine (CLARITIN) tablet 10 mg  10 mg Oral Daily Ellsworth LennoxBrittany M Strader, [place]   10 mg at 04/13/15 1001  . ondansetron (ZOFRAN) injection 4 mg  4 mg Intravenous Q6H PRN Duke SalviaSteven C Klein, MD      . pantoprazole (PROTONIX) EC tablet 40 mg  40 mg Oral Daily Ellsworth LennoxBrittany M Strader, [place]   40 mg at 04/13/15 1002  . sodium chloride 0.9 % injection 3 mL  3 mL Intravenous Q12H Ellsworth LennoxBrittany M Strader, PA   3 mL at 04/12/15 0930  . sodium chloride 0.9 % injection 3 mL  3 mL Intravenous PRN Ellsworth LennoxBrittany M Strader, [place]      . vitamin B-12  (CYANOCOBALAMIN) tablet 1,000 mcg  1,000 mcg Oral Daily Ellsworth LennoxBrittany M Strader, [place]   1,000 mcg at 04/13/15 1001   Do not use this list as official medication orders. Please verify with discharge summary.  Discharge Medications:   Medication List    ASK your doctor about these medications        acetaminophen 500 MG tablet  Commonly known as:  TYLENOL  Take 500 mg by mouth every 6 (six) hours as needed for mild pain.     amLODipine 5 MG tablet  Commonly known as:  NORVASC  Take 5 mg by mouth every morning.     aspirin EC 81 MG tablet  Take 81 mg by mouth daily.     celecoxib 100 MG capsule  Commonly known as:  CELEBREX  Take 100 mg by mouth daily.     citalopram 40 MG tablet  Commonly known as:  CELEXA  Take 20-40 mg by mouth at bedtime.     clotrimazole 1 % cream  Commonly known as:  LOTRIMIN  Apply 1 application topically 2 (two) times daily.     furosemide 20 MG tablet  Commonly known as:  LASIX  Take 20 mg by mouth daily.     levothyroxine 88 MCG tablet  Commonly known as:  SYNTHROID, LEVOTHROID  Take 88 mcg by mouth daily before breakfast.     loratadine 10 MG tablet  Commonly known as:  CLARITIN  Take 10 mg by mouth daily.     metoprolol 100 MG tablet  Commonly known as:  LOPRESSOR  Take 100 mg by mouth 2 (two) times daily.     multivitamin with minerals Tabs tablet  Take 1 tablet by mouth daily.     omeprazole 20 MG capsule  Commonly known as:  PRILOSEC  Take 20 mg by mouth daily.     valsartan 160 MG tablet  Commonly known as:  DIOVAN  Take 160 mg by mouth daily.     vitamin B-12 1000 MCG tablet  Commonly known as:  CYANOCOBALAMIN  Take 1,000 mcg by mouth daily.     VITAMIN D3 SUPER STRENGTH 2000 UNITS Caps  Generic drug:  Cholecalciferol  Take 2,000 Units by mouth daily.        Relevant Imaging Results:  Relevant Lab Results:  Recent Labs    Additional Information SS# [id number]  Jenita SeashoreMaggie Azzan Butler BSW Intern, [phone]

## 2015-04-13 NOTE — Care Management Note (Signed)
Case Management Note Previous CM note initiated by Dorcas Carroweborah Dowell RN CM  Patient Details  Name: Summer Graham MRN: [id number] Date of Birth: 12/03/1915  Subjective/Objective:    Adm w heart block                Action/Plan: lives at home   Expected Discharge Date:                  Expected Discharge Plan:  Skilled Nursing Facility  In-House Referral:  Clinical Social Work  Discharge planning Services  CM Consult  Post Acute Care Choice:    Choice offered to:     DME Arranged:    DME Agency:     HH Arranged:    HH Agency:     Status of Service:  In process, will continue to follow  Medicare Important Message Given:  Yes Date Medicare IM Given:    Medicare IM give by:    Date Additional Medicare IM Given:    Additional Medicare Important Message give by:     If discussed at Long Length of Stay Meetings, dates discussed:    Additional Comments:ur review done  04/13/15- Donn PieriniKristi Sandrina Heaton RN, BSN- referral for possible rehab- [facility] and most likely will need STSNF rehab there- have requested PT eval which is pending- CSW consulted for possible STSNF need.   Darrold SpanWebster, Domnique Vantine Hall, RN 04/13/2015, 11:24 AM

## 2015-04-13 NOTE — Progress Notes (Signed)
SUBJECTIVE:  Feels well, but does not feeel strong enough to go home.  Family had mentioned a rehab facility when ready to leave the hospital.  OBJECTIVE:   Vitals:   Filed Vitals:   04/12/15 2056 04/13/15 0147 04/13/15 0445 04/13/15 0514  BP: 124/55 121/64 114/49   Pulse: 93 107 88   Temp: 98.1 F (36.7 C)  98.1 F (36.7 C)   TempSrc: Oral  Oral   Resp: 20 18 18    Height:      Weight:    142 lb 3.2 oz (64.501 kg)  SpO2: 92%  91%    I&O's:   Intake/Output Summary (Last 24 hours) at 04/13/15 0839 Last data filed at 04/12/15 1630  Gross per 24 hour  Intake    290 ml  Output     50 ml  Net    240 ml   TELEMETRY: Reviewed telemetry pt in paced rhythm:     PHYSICAL EXAM General: Well developed, well nourished, in no acute distress Head:   Normal cephalic and atramatic  Lungs:  Coarse breath sounds bilaterally to auscultation. Heart:   HRRR S1 S2  No JVD.   Abdomen: abdomen soft and non-tender Msk:  Back normal,  Normal strength and tone for age. Left arm in a sling Extremities:   No edema.   Neuro: Alert and oriented. Psych:  Normal affect, responds appropriately Skin: No rash   LABS: Basic Metabolic Panel:  Recent Labs  16/02/9610/14/16 0940 04/13/15 0314  NA 137 137  K 4.6 4.4  CL 95* 99*  CO2 31 29  GLUCOSE 101* 99  BUN 21* 16  CREATININE 1.25* 1.03*  CALCIUM 9.3 8.8*   Liver Function Tests: No results for input(s): AST, ALT, ALKPHOS, BILITOT, PROT, ALBUMIN in the last 72 hours. No results for input(s): LIPASE, AMYLASE in the last 72 hours. CBC:  Recent Labs  04/13/15 0314  WBC 6.7  HGB 9.7*  HCT 31.7*  MCV 80.9  PLT 249   Cardiac Enzymes: No results for input(s): CKTOTAL, CKMB, CKMBINDEX, TROPONINI in the last 72 hours. BNP: Invalid input(s): POCBNP D-Dimer: No results for input(s): DDIMER in the last 72 hours. Hemoglobin A1C: No results for input(s): HGBA1C in the last 72 hours. Fasting Lipid Panel: No results for input(s): CHOL, HDL,  LDLCALC, TRIG, CHOLHDL, LDLDIRECT in the last 72 hours. Thyroid Function Tests: No results for input(s): TSH, T4TOTAL, T3FREE, THYROIDAB in the last 72 hours.  Invalid input(s): FREET3 Anemia Panel: No results for input(s): VITAMINB12, FOLATE, FERRITIN, TIBC, IRON, RETICCTPCT in the last 72 hours. Coag Panel:   Lab Results  Component Value Date   INR 1.16 04/12/2015    RADIOLOGY: Dg Chest 2 View  04/13/2015  CLINICAL DATA:  Pacemaker placement. EXAM: CHEST  2 VIEW COMPARISON:  April 10, 2015. FINDINGS: Stable cardiomegaly. Interval placement of left-sided pacemaker with leads in grossly good position. No pneumothorax or significant pleural effusion is noted. Stable elevated right hemidiaphragm is noted. Mild to moderate degenerative changes seen involving both glenohumeral joints. No acute pulmonary disease is noted. IMPRESSION: Interval placement of left-sided pacemaker. No pneumothorax is noted. Electronically Signed   By: Lupita RaiderJames  Green Jr, M.D.   On: 04/13/2015 07:49   Dg Chest 2 View  04/08/2015  CLINICAL DATA:  Near syncope today, shortness of breath on exertion for a unspecified length of time, possible pneumonia, personal history hypertension and heart failure EXAM: CHEST  2 VIEW COMPARISON:  None FINDINGS: Enlargement of cardiac silhouette with  pulmonary vascular congestion. Atherosclerotic calcification aorta. Elevation versus eventration of RIGHT diaphragm. Bronchitic changes with bibasilar atelectasis. No pleural effusion or pneumothorax. Bones demineralized. IMPRESSION: Enlargement of cardiac silhouette with pulmonary vascular congestion. Bibasilar atelectasis and central bronchitic changes. Electronically Signed   By: Ulyses Southward M.D.   On: 04/08/2015 11:03   Dg Chest Port 1 View  04/10/2015  CLINICAL DATA:  Dyspnea.  Severe shortness of breath today. EXAM: PORTABLE CHEST 1 VIEW COMPARISON:  04/08/2015 FINDINGS: Lung volumes are low. Mild opacity at the right lung base is likely  atelectasis. No convincing pneumonia and no pulmonary edema. The cardiac silhouette is mildly enlarged. The aorta is uncoiled. No obvious pleural effusion.  No pneumothorax. IMPRESSION: 1. No acute cardiopulmonary disease. No significant change from the prior study. Electronically Signed   By: Amie Portland M.D.   On: 04/10/2015 09:21      ASSESSMENT: Roosvelt Harps:    1) s/p pacer: Doing well.  No PTX.  Will confirm with EP that she is ok for discharge from their standpoint.  She will likely need to go to rehab to help for improvement of strength.  Wil consult case Production designer, theatre/television/film.  Acute on chronic diastolic heart failure She appears euvolemic.  CXR did not show edema.  Continue home dose of Lasix.  Corky Crafts, MD  04/13/2015  8:39 AM

## 2015-04-14 ENCOUNTER — Encounter (HOSPITAL_COMMUNITY): Payer: Self-pay

## 2015-04-14 DIAGNOSIS — N179 Acute kidney failure, unspecified: Secondary | ICD-10-CM

## 2015-04-14 DIAGNOSIS — N189 Chronic kidney disease, unspecified: Secondary | ICD-10-CM | POA: Diagnosis present

## 2015-04-14 DIAGNOSIS — I5032 Chronic diastolic (congestive) heart failure: Secondary | ICD-10-CM | POA: Diagnosis present

## 2015-04-14 DIAGNOSIS — I5033 Acute on chronic diastolic (congestive) heart failure: Secondary | ICD-10-CM

## 2015-04-14 DIAGNOSIS — I10 Essential (primary) hypertension: Secondary | ICD-10-CM | POA: Diagnosis present

## 2015-04-14 DIAGNOSIS — Z950 Presence of cardiac pacemaker: Secondary | ICD-10-CM

## 2015-04-14 MED ORDER — METOPROLOL TARTRATE 50 MG PO TABS: 50.0000 mg | ORAL_TABLET | Freq: Two times a day (BID) | ORAL | Status: AC

## 2015-04-14 NOTE — Progress Notes (Signed)
Pt and patient's son were given discharge instructions. Pt and patient's son were given information about follow-up appointments, medications, and pt instructions. Pt's IV and tele box were removed. Pt was discharged via wheelchair and was accompanied by an Charity fundraiserN, her son, and daughter in law. Pt was in no distress and stated that she was pleased with the care that she had received during her hospital stay.   Berdine DanceLauren Moffitt- RN, BSN

## 2015-04-14 NOTE — Discharge Instructions (Signed)
° ° °  Supplemental Discharge Instructions for  Pacemaker/Defibrillator Patients  Activity No heavy lifting or vigorous activity with your left/right arm for 6 to 8 weeks.  Do not raise your left/right arm above your head for one week.  Gradually raise your affected arm as drawn below.                04-17-15               04-18-15                  04-19-15                  04-20-15   WOUND CARE - Keep the wound area clean and dry.  Do not get this area wet for one week. No showers for one week; you may shower on   04-20-15  . - The tape/steri-strips on your wound will fall off; do not pull them off.  No bandage is needed on the site.  DO  NOT apply any creams, oils, or ointments to the wound area. - If you notice any drainage or discharge from the wound, any swelling or bruising at the site, or you develop a fever > 101? F after you are discharged home, call the office at once.  Special Instructions - You are still able to use cellular telephones; use the ear opposite the side where you have your pacemaker/defibrillator.  Avoid carrying your cellular phone near your device. - When traveling through airports, show security personnel your identification card to avoid being screened in the metal detectors.  Ask the security personnel to use the hand wand. - Avoid arc welding equipment, MRI testing (magnetic resonance imaging), TENS units (transcutaneous nerve stimulators).  Call the office for questions about other devices. - Avoid electrical appliances that are in poor condition or are not properly grounded. - Microwave ovens are safe to be near or to operate.

## 2015-04-14 NOTE — Progress Notes (Signed)
Report was given to [name], [facility]N [facility]. [facility] Berdine DanceLauren Moffitt, RN

## 2015-04-14 NOTE — Progress Notes (Signed)
Patient Name: Summer Graham Date of Encounter: 04/14/2015     Principal Problem:   Heart block AV second degree Active Problems:   Bradycardia   Acute diastolic CHF (congestive heart failure) (HCC)   Essential hypertension   Acute renal failure (HCC)   Iron deficiency anemia   Near syncope    SUBJECTIVE  She was mildly confused at first but she started to improve midway into our conversation. Family at bedside and initially concerned. She was able to tell me what hospital she was at and her birthday. She was just waking up. Had a nice warm bath this AM and didn't sleep well last night.    CURRENT MEDS . amLODipine  5 mg Oral q morning - 10a  . aspirin EC  81 mg Oral Daily  . celecoxib  100 mg Oral Daily  . citalopram  20 mg Oral QHS  . docusate sodium  100 mg Oral QHS  . furosemide  20 mg Oral Daily  . irbesartan  150 mg Oral Daily  . iron polysaccharides  150 mg Oral Daily  . levothyroxine  88 mcg Oral QAC breakfast  . loratadine  10 mg Oral Daily  . pantoprazole  40 mg Oral Daily  . sodium chloride  3 mL Intravenous Q12H  . vitamin B-12  1,000 mcg Oral Daily    OBJECTIVE  Filed Vitals:   04/13/15 1338 04/13/15 2056 04/14/15 0347 04/14/15 0354  BP: 104/49 140/64  147/68  Pulse: 62 90  89  Temp: 97.5 F (36.4 C) 97.8 F (36.6 C)  97.5 F (36.4 C)  TempSrc: Oral Oral  Oral  Resp: 18 18  20   Height:      Weight:   147 lb 7.8 oz (66.9 kg)   SpO2: 96% 97%  95%    Intake/Output Summary (Last 24 hours) at 04/14/15 0844 Last data filed at 04/13/15 1800  Gross per 24 hour  Intake    480 ml  Output     52 ml  Net    428 ml   Filed Weights   04/12/15 0301 04/13/15 0514 04/14/15 0347  Weight: 146 lb 6.2 oz (66.4 kg) 142 lb 3.2 oz (64.501 kg) 147 lb 7.8 oz (66.9 kg)    PHYSICAL EXAM  General: Pleasant, NAD. Elderly and frail.  Neuro: Alert and oriented X 3. Moves all extremities spontaneously. Psych: Normal affect. HEENT:  Normal  Neck: Supple without  bruits or JVD. Lungs:  Resp regular and unlabored, CTA. Heart: RRR no s3, s4, or murmurs. Abdomen: Soft, non-tender, non-distended, BS + x 4.  Extremities: No clubbing, cyanosis or edema. DP/PT/Radials 2+ and equal bilaterally.  Accessory Clinical Findings  CBC  Recent Labs  04/13/15 0314  WBC 6.7  HGB 9.7*  HCT 31.7*  MCV 80.9  PLT 249   Basic Metabolic Panel  Recent Labs  04/12/15 0940 04/13/15 0314  NA 137 137  K 4.6 4.4  CL 95* 99*  CO2 31 29  GLUCOSE 101* 99  BUN 21* 16  CREATININE 1.25* 1.03*  CALCIUM 9.3 8.8*    TELE  V paced and occasionally AV paced. HR in 90s  Radiology/Studies  Dg Chest 2 View  04/13/2015  CLINICAL DATA:  Pacemaker placement. EXAM: CHEST  2 VIEW COMPARISON:  April 10, 2015. FINDINGS: Stable cardiomegaly. Interval placement of left-sided pacemaker with leads in grossly good position. No pneumothorax or significant pleural effusion is noted. Stable elevated right hemidiaphragm is noted. Mild to moderate degenerative changes  seen involving both glenohumeral joints. No acute pulmonary disease is noted. IMPRESSION: Interval placement of left-sided pacemaker. No pneumothorax is noted. Electronically Signed   By: Lupita Raider, M.D.   On: 04/13/2015 07:49   Dg Chest 2 View  04/08/2015  CLINICAL DATA:  Near syncope today, shortness of breath on exertion for a unspecified length of time, possible pneumonia, personal history hypertension and heart failure EXAM: CHEST  2 VIEW COMPARISON:  None FINDINGS: Enlargement of cardiac silhouette with pulmonary vascular congestion. Atherosclerotic calcification aorta. Elevation versus eventration of RIGHT diaphragm. Bronchitic changes with bibasilar atelectasis. No pleural effusion or pneumothorax. Bones demineralized. IMPRESSION: Enlargement of cardiac silhouette with pulmonary vascular congestion. Bibasilar atelectasis and central bronchitic changes. Electronically Signed   By: Ulyses Southward M.D.   On:  04/08/2015 11:03   Dg Chest Port 1 View  04/10/2015  CLINICAL DATA:  Dyspnea.  Severe shortness of breath today. EXAM: PORTABLE CHEST 1 VIEW COMPARISON:  04/08/2015 FINDINGS: Lung volumes are low. Mild opacity at the right lung base is likely atelectasis. No convincing pneumonia and no pulmonary edema. The cardiac silhouette is mildly enlarged. The aorta is uncoiled. No obvious pleural effusion.  No pneumothorax. IMPRESSION: 1. No acute cardiopulmonary disease. No significant change from the prior study. Electronically Signed   By: Amie Portland M.D.   On: 04/10/2015 09:21    ASSESSMENT AND PLAN Summer Graham is a 81 y.o. female with past medical history of HTN, COPD, and CHF who presented to [facility] ED on 04/08/2015 for a presyncopal episode and found to have 2:1 AV block and acute CHF.   Acute on chronic diastolic CHF - BNP elevated to 951 and CXR with pulmonary vasc congestion. She was diuresed with IV Lasix. Net neg 2.5L and weight down 2 lbs (149-->147).  - She appears euvolemic. Continue Lasix  po qd.  - Echo shows EF 60-65%, mild AS and AI, mildly dilated LA, mild diastolic dysfunction  Bradycardia secondary to 2:1 Heart Block - HR noted to be in 30's - 70's while in the ED. She was on metoprolol  BID which was discontinued upon admission. Her heart block did not resolve with complete washout and permanent pacemaker was felt to be indicated. Biotronik pulse generator: Serial # [id number] was successfully implanted on 04/12/15. - Wound without hematoma, CXR without ptx and device interrogation reviewed and normal  Iron def anemia- Hgb 9.4 at time of admission. Ferritin 15 which is low normal and less consistent with anemia of chronic disease.  - started on iron and colace  COPD- continue home O2 and albuterol Nebulizer PRN  HTN - continue amlodipine  and irbesartan . BP still mildly elevated. We can add back her home Lopressor  BID with PPM in place  AKI- now  resolved. Creat bumped with IV diuresis  Dispo- will be discharged to SNF  Signed, Janetta Hora PA-C  Pager [phone]  I have examined the patient and reviewed assessment and plan and discussed with patient.  Agree with above as stated.  S/p PPM.  Mild bruising at the site.  No hematoma.  Reminded patient about left arm movement restrictions.  Will restart Metoprolol 50 mg BID. May need to increase to 100 mg BID at a later time if BP stays elevated.  Continue other antihypertensives.  CRI stable.  VARANASI,JAYADEEP S.

## 2015-04-14 NOTE — Discharge Summary (Signed)
Discharge Summary   Patient ID: Summer Graham MRN: [id number], DOB/AGE: August 24, 1915 81 y.o. Admit date: 04/08/2015 D/C date:     04/14/2015  Primary Cardiologist: Dr. Allyson Sabal  Primary Electrophysiologist: Dr. Elberta Fortis  Principal Problem:   Near syncope felt to be 2/2 heart block: AV second degree   Acute diastolic CHF (congestive heart failure) (HCC)   Acute renal failure - now resolved Active Problems:   Essential hypertension   Iron deficiency anemia   S/P placement of cardiac pacemaker   Chronic diastolic CHF (congestive heart failure) (HCC)   COPD- on home 02   CKD    Admission Dates: 04/08/15-04/14/15 Discharge Diagnosis: 2:1 AV block s/p PPM and acute on chronic diastolic CHF. Biotronik pulse generator: Serial # [id number] was successfully implanted on 04/12/15.    HPI: Summer Graham is a 81 y.o. female with a history of HTN, COPD on 02 , Parkinson's diease and CHF who presented to [facility] ED on 04/08/2015 for a presyncopal episode and found to have 2:1 AV block and acute CHF.   Patient had been living at [facility] in [place]. Upon admission, she reported she was walking back from the restroom with the help of a CNA when she reported feeling dizzy and like she might pass out. She did not have an actual syncopal event. She is on 2.5L O2 at all times. She reported being on Lasix for heart failure from PCP at her living facility (Dr. Ok Anis), but quit taking the Lasix several months back due to being annoyed with frequent urination.  While in the ED, her HR was 34 -72 bpm. BNP was found to be elevated to 951. Troponin negative. Creatinine 1.37 (unsure of baseline), Hgb at 9.4. CXR showing pulmonary vascular congestion along with bibasilar atelectasis. EKG showing 2:1 heart block with TWI in V3, V4, and V5. Her metoprolol  BID was discontinued and allowed to completely wash out; however, her heart block minimally improved. She was seen by EP who felt PPM was indicated. She  was also felt to have acute on chronic diastolic CHF. She was diuresed with IV lasix and then discharged on previous dosing on lasix  po qd. Her creatanine bumped with diruesis but AKI now resolved. She has baseline CKD stage III. Metoprolol 50 mg BID will be resumed at discharge now that she has pacemaker in place (she was originally  BID).   Hospital Course  Acute on chronic diastolic CHF  - BNP elevated to 951 and CXR with pulmonary vasc congestion. She was diuresed with IV Lasix. Net neg 2.5L and weight down 2 lbs (149-->147).  - She appears euvolemic. Continue Lasix  po qd.  - 2D Echo 03/2015: EF 60-65%, mild AS and AI, mildly dilated LA, mild diastolic dysfunction   Bradycardia secondary to 2:1 Heart Block  - HR noted to be in 30's - 70's while in the ED. She was on metoprolol  BID which was discontinued upon admission. Her heart block did not resolve with complete washout and permanent pacemaker was felt to be indicated. Biotronik pulse generator: Serial # [id number] was successfully implanted on 04/12/15.  - Wound without hematoma, CXR without ptx and device interrogation reviewed and normal   Iron def anemia- Hgb 9.4 at time of admission. Ferritin 15 which is low normal and less consistent with anemia of chronic disease.  - started on iron and colace   COPD- continue home O2 and albuterol Nebulizer PRN   HTN - continue amlodipine  and irbesartan  150mg . BP still mildly elevated. We can add back her home Metoprolol 50 mg BID will be resumed at discharge now that she has pacemaker in place (she was originally 100mg  BID). This can be increased if her BP remains elevated.   Acute on CKD stage III- now resolved. Creat bumped with IV diuresis. Baseline creat ~1 and GFR 44.  Dispo- [facility]   The patient has had an uncomplicated hospital course and is recovering well. She has been seen by Dr. Eldridge DaceVaranasi today and deemed ready for discharge  home. All follow-up appointments have been scheduled. Discharge medications are listed below.   Discharge Vitals: Blood pressure 147/68, pulse 89, temperature 97.5 F (36.4 C), temperature source Oral, resp. rate 20, height 5\' 2"  (1.575 m), weight 147 lb 7.8 oz (66.9 kg), SpO2 95 %.  Labs: Lab Results  Component Value Date   WBC 6.7 04/13/2015   HGB 9.7* 04/13/2015   HCT 31.7* 04/13/2015   MCV 80.9 04/13/2015   PLT 249 04/13/2015    Recent Labs Lab 04/08/15 1108  04/13/15 0314  NA 140  < > 137  K 4.6  < > 4.4  CL 103  < > 99*  CO2 28  < > 29  BUN 31*  < > 16  CREATININE 1.37*  < > 1.03*  CALCIUM 9.2  < > 8.8*  PROT 6.7  --   --   BILITOT 0.6  --   --   ALKPHOS 68  --   --   ALT 15  --   --   AST 27  --   --   GLUCOSE 109*  < > 99  < > = values in this interval not displayed.   Diagnostic Studies/Procedures   Dg Chest 2 View  04/13/2015  CLINICAL DATA:  Pacemaker placement. EXAM: CHEST  2 VIEW COMPARISON:  April 10, 2015. FINDINGS: Stable cardiomegaly. Interval placement of left-sided pacemaker with leads in grossly good position. No pneumothorax or significant pleural effusion is noted. Stable elevated right hemidiaphragm is noted. Mild to moderate degenerative changes seen involving both glenohumeral joints. No acute pulmonary disease is noted. IMPRESSION: Interval placement of left-sided pacemaker. No pneumothorax is noted. Electronically Signed   By: Lupita RaiderJames  Green Jr, M.D.   On: 04/13/2015 07:49   Dg Chest 2 View  04/08/2015  CLINICAL DATA:  Near syncope today, shortness of breath on exertion for a unspecified length of time, possible pneumonia, personal history hypertension and heart failure EXAM: CHEST  2 VIEW COMPARISON:  None FINDINGS: Enlargement of cardiac silhouette with pulmonary vascular congestion. Atherosclerotic calcification aorta. Elevation versus eventration of RIGHT diaphragm. Bronchitic changes with bibasilar atelectasis. No pleural effusion or  pneumothorax. Bones demineralized. IMPRESSION: Enlargement of cardiac silhouette with pulmonary vascular congestion. Bibasilar atelectasis and central bronchitic changes. Electronically Signed   By: Ulyses SouthwardMark  Boles M.D.   On: 04/08/2015 11:03   Dg Chest Port 1 View  04/10/2015  CLINICAL DATA:  Dyspnea.  Severe shortness of breath today. EXAM: PORTABLE CHEST 1 VIEW COMPARISON:  04/08/2015 FINDINGS: Lung volumes are low. Mild opacity at the right lung base is likely atelectasis. No convincing pneumonia and no pulmonary edema. The cardiac silhouette is mildly enlarged. The aorta is uncoiled. No obvious pleural effusion.  No pneumothorax. IMPRESSION: 1. No acute cardiopulmonary disease. No significant change from the prior study. Electronically Signed   By: Amie Portlandavid  Ormond M.D.   On: 04/10/2015 09:21    04/12/15 Preop Dx: syncope  and 2AVblock Postop Dx same  Procedure: dual chamber pacemaker insertion  After routine prep and drape, lidocaine was infiltrated in the prepectoral subclavicular region on the left side an incision was made and carried down to layer of the prepectoral fascia using electrocautery and sharp dissection; a pocket was formed similarly. Hemostasis was obtained.  After this, we turned our attention to gaining access to the extrathoracic,left subclavian vein. This was accomplished without difficulty and without the aspiration of air or puncture of the artery. 2 separate venipunctures were accomplished; guidewires were placed and retained and sequentially 7 French sheaths were placed through which were passed a St Jude ventricular lead, serial number [id number] and a Medtronic 5046 atrial lead, serial number [id number].  The ventricular lead was manipulated to the right ventricular apex where the bipolar R wave was 8.4, the pacing impedance was 918, the threshold was 0.4 V @ 0.4 msec . Current at threshold was 0.5 ma.  The right atrial lead- PASSIVE could not be deployed.  We then switch with reluctance to an active atrial lead and with moderate difficulty, finallly found a reasonable site on the lower atrial wall where the bipolar P-wave was 4.2, the pacing impedance was 601, the threshold was 1.6 V @ 0.4 msec. Current at threshold was 2.5 ma After some time threshold 0.8@0 .  The leads were affixed to the prepectoral fascia and attached to a .biotronik pulse generator. Serial number [id number]  Hemostasis was obtained. The pocket was copiously irrigated with antibiotic containing saline solution. The leads and the pulse generator were placed in the pocket and affixed to the prepectoral fascia. The wound was then closed in 2 layers in normal fashion. The wound was washed dried and a dermabond was applied. Needle count, sponge count and instrument counts were correct at the end of the procedure. The patient tolerated the procedure without apparent complication.    2D ECHO: 04/09/2015 LV EF: 60% -   65% Study Conclusions  - Left ventricle: The cavity size was normal. Wall thickness was   normal. Systolic function was normal. The estimated ejection   fraction was in the range of 60% to 65%. Wall motion was normal;   there were no regional wall motion abnormalities. Doppler   parameters are consistent with abnormal left ventricular   relaxation (grade 1 diastolic dysfunction). - Aortic valve: There was very mild stenosis. There was mild   regurgitation. - Left atrium: The atrium was mildly dilated.    Discharge Medications     Medication List    TAKE these medications        acetaminophen 500 MG tablet  Commonly known as:  TYLENOL  Take 500 mg by mouth every 6 (six) hours as needed for mild pain.     amLODipine 5 MG tablet  Commonly known as:  NORVASC  Take 5 mg by mouth every morning.     aspirin EC 81 MG tablet  Take 81 mg by mouth daily.     celecoxib 100 MG capsule  Commonly known as:  CELEBREX  Take 100 mg by mouth daily.       citalopram 40 MG tablet  Commonly known as:  CELEXA  Take 20-40 mg by mouth at bedtime.     clotrimazole 1 % cream  Commonly known as:  LOTRIMIN  Apply 1 application topically 2 (two) times daily.     furosemide 20 MG tablet  Commonly known as:  LASIX  Take 20 mg by mouth daily.  levothyroxine 88 MCG tablet  Commonly known as:  SYNTHROID, LEVOTHROID  Take 88 mcg by mouth daily before breakfast.     loratadine 10 MG tablet  Commonly known as:  CLARITIN  Take 10 mg by mouth daily.     metoprolol 50 MG tablet  Commonly known as:  LOPRESSOR  Take 1 tablet (50 mg total) by mouth 2 (two) times daily.     multivitamin with minerals Tabs tablet  Take 1 tablet by mouth daily.     omeprazole 20 MG capsule  Commonly known as:  PRILOSEC  Take 20 mg by mouth daily.     valsartan 160 MG tablet  Commonly known as:  DIOVAN  Take 160 mg by mouth daily.     vitamin B-12 1000 MCG tablet  Commonly known as:  CYANOCOBALAMIN  Take 1,000 mcg by mouth daily.     VITAMIN D3 SUPER STRENGTH 2000 UNITS Caps  Generic drug:  Cholecalciferol  Take 2,000 Units by mouth daily.        Disposition   The patient will be discharged in stable condition to SNF.  Follow-up Information    Follow up with [facility] On 04/29/2015.   Specialty:  Cardiology   Why:  at [facility] for wound check   Contact information:   [address], [address] [place] [address] [phone]      Follow up with Will Jorja Loa, MD On 07/19/2015.   Specialty:  Cardiology   Why:  @ 12pm   Contact information:   [address] [place] [address] [phone]       Follow up with Janetta Hora, PA-C On 05/03/2015.   Specialties:  Cardiology, Radiology   Why:  @ 11:30am   Contact information:   [address] [place] [address] [phone]         Duration of Discharge Encounter: Greater than 30 minutes including physician and  PA time.  SignedCline Crock R PA-C 04/14/2015, 10:46 AM   I have examined the patient and reviewed assessment and plan and discussed with patient. Agree with above as stated. S/p PPM. Mild bruising at the site. No hematoma. Reminded patient about left arm movement restrictions. Will restart Metoprolol 50 mg BID. May need to increase to 100 mg BID at a later time if BP stays elevated. Continue other antihypertensives. CRI stable.  Jakaila Norment S.

## 2015-04-15 ENCOUNTER — Telehealth: Payer: Self-pay | Admitting: Cardiovascular Disease

## 2015-04-15 NOTE — Telephone Encounter (Signed)
D/C phone call . appt is on 04/29/15 at 11:30am w/ [facility]athryn [facility] .   Thanks

## 2015-04-16 NOTE — Telephone Encounter (Signed)
[facility] Phone # listed is "temporarilty disconnected".  No DPR on file.   Called son Francesco RunnerH. Colford - [facility]. Her contact # there is [phone].  Attempted to contact patient at this number. Phone rang no answer then disconnected.

## 2015-04-20 NOTE — Telephone Encounter (Signed)
Called previous number- patient actual room phone number. Patient unaware of any appointment she is patient in the wellness setting.  Called left message on incoming # [phone] to call back to confirm appointment

## 2015-04-21 NOTE — Telephone Encounter (Signed)
Spoke w/ Pattricia [facility]Annie at [facility] and gave her the appt date time of Appt .

## 2015-04-29 ENCOUNTER — Encounter: Payer: Self-pay | Admitting: Cardiology

## 2015-04-29 ENCOUNTER — Ambulatory Visit (INDEPENDENT_AMBULATORY_CARE_PROVIDER_SITE_OTHER): Payer: Medicare Other | Admitting: *Deleted

## 2015-04-29 DIAGNOSIS — I441 Atrioventricular block, second degree: Secondary | ICD-10-CM | POA: Diagnosis not present

## 2015-04-29 DIAGNOSIS — R001 Bradycardia, unspecified: Secondary | ICD-10-CM

## 2015-04-30 LAB — CUP PACEART INCLINIC DEVICE CHECK
Battery Remaining Longevity: 97 mo
Brady Statistic AP VP Percent: 34 %
Brady Statistic AP VS Percent: 0 %
Brady Statistic AS VP Percent: 66 %
Date Time Interrogation Session: 20161201221700
Implantable Lead Location: 753860
Implantable Lead Model: 1948
Implantable Lead Model: 5076
Lead Channel Impedance Value: 390 Ohm
Lead Channel Impedance Value: 819 Ohm
Lead Channel Pacing Threshold Amplitude: 0.6 V
Lead Channel Pacing Threshold Pulse Width: 0.4 ms
Lead Channel Pacing Threshold Pulse Width: 0.4 ms
Lead Channel Sensing Intrinsic Amplitude: 10.7 mV
Lead Channel Sensing Intrinsic Amplitude: 2.2 mV
Lead Channel Setting Pacing Amplitude: 3 V
MDC IDC LEAD IMPLANT DT: 20161114
MDC IDC LEAD IMPLANT DT: 20161114
MDC IDC LEAD LOCATION: 753859
MDC IDC MSMT LEADCHNL RA PACING THRESHOLD AMPLITUDE: 0.7 V
MDC IDC MSMT LEADCHNL RA PACING THRESHOLD AMPLITUDE: 0.7 V
MDC IDC MSMT LEADCHNL RA PACING THRESHOLD AMPLITUDE: 0.7 V
MDC IDC MSMT LEADCHNL RA PACING THRESHOLD PULSEWIDTH: 0.4 ms
MDC IDC MSMT LEADCHNL RA SENSING INTR AMPL: 2.5 mV
MDC IDC MSMT LEADCHNL RV PACING THRESHOLD AMPLITUDE: 0.6 V
MDC IDC MSMT LEADCHNL RV PACING THRESHOLD AMPLITUDE: 0.6 V
MDC IDC MSMT LEADCHNL RV PACING THRESHOLD PULSEWIDTH: 0.4 ms
MDC IDC MSMT LEADCHNL RV PACING THRESHOLD PULSEWIDTH: 0.4 ms
MDC IDC MSMT LEADCHNL RV PACING THRESHOLD PULSEWIDTH: 0.4 ms
MDC IDC SET LEADCHNL RA PACING AMPLITUDE: 3 V
MDC IDC SET LEADCHNL RV PACING PULSEWIDTH: 0.4 ms
MDC IDC STAT BRADY AS VS PERCENT: 0 %
Pulse Gen Model: 394931
Pulse Gen Serial Number: 68612943

## 2015-04-30 NOTE — Progress Notes (Addendum)
Wound check appointment. Steri-strips removed prior to appt. Wound without redness or edema. Incision edges approximated, wound well healed. Normal device function. Thresholds, sensing, and impedances consistent with implant measurements. Device programmed at 3.0V for extra safety margin until 3 month visit. Histogram distribution appropriate for patient and level of activity. No mode switches or high ventricular rates noted. Patient educated about wound care, arm mobility, lifting restrictions. ROV in 3 months with WC.

## 2015-05-02 NOTE — Progress Notes (Signed)
Cardiology Office Note   Date:  05/03/2015   ID:  Summer Graham, DOB 1915/09/13, MRN [id number]  PCP:  Ginette Otto, MD  Primary Cardiologist: Dr. Allyson Sabal  Primary Electrophysiologist: Dr. Elberta Fortis   Post hospital follow up    History of Present Illness: Summer Graham is a 81 y.o. female HTN, COPD on 02 , Parkinson's diease and CHF who presents to clinic for post hospital follow up after a recent admission for 2:1 AV block s/p PPM and acute CHF.   Patient had been living at [facility] in [place]. Upon admission, she reported she was walking back from the restroom with the help of a CNA when she reported feeling dizzy and like she might pass out. She did not have an actual syncopal event. She is on 2.5L O2 at all times. She reported being on Lasix for heart failure from PCP at her living facility (Dr. Ok Anis), but quit taking the Lasix several months back due to being annoyed with frequent urination. While in the ED, her HR was 34 -72 bpm. BNP was found to be elevated to 951. Troponin negative. Creatinine 1.37 (unsure of baseline), Hgb at 9.4. CXR showing pulmonary vascular congestion along with bibasilar atelectasis. EKG showing 2:1 heart block with TWI in V3, V4, and V5. Her metoprolol  BID was discontinued and allowed to completely wash out; however, her heart block minimally improved. She was seen by EP who felt PPM was indicated. She was also felt to have acute on chronic diastolic CHF. She was diuresed with IV lasix and then discharged on previous dosing on lasix  po qd. Her creatanine bumped with diruesis but AKI now resolved. She has baseline CKD stage III. Metoprolol 50 mg BID will be resumed at discharge now that she has pacemaker in place (she was originally  BID). She was discharged on 04/14/15 to [facility].  Seen in our office device clinic for wound check on 04/29/15. Device was interrogated with normal functioning and wound healing well.   Today she  presents for post hospital follow up. She denies CP, sob, orthopnea or PND. She has trace LE edema. SHe sometimes gets lightheaded when standing. Her biggest complaint is not being able to sleep at night due to running legs and anxiety. She used to use valium for this which helped.    Past Medical History  Diagnosis Date  . Hypertension   . Chronic diastolic CHF (congestive heart failure) (HCC)     a. 03/2015: 2D ECHO: EF 60-65%, mild AS and AI, mildly dilated LA, mild diastolic dysfunction   . Parkinson's disease (HCC)   . COPD (chronic obstructive pulmonary disease) (HCC)   . Heart block AV second degree     a. s/p Biotronik pulse generator: Serial # [id number] was successfully implanted on 04/12/15.   . S/P placement of cardiac pacemaker     a. Biotronik pulse generator: Serial # [id number] was successfully implanted on 04/12/15.   . Iron deficiency anemia     a. placed on iron and colase during 03/2016 admission   . CKD (chronic kidney disease)     Past Surgical History  Procedure Laterality Date  . Appendectomy      At age 56  . Total abdominal hysterectomy      Age 70  . Ep implantable device N/A 04/12/2015    Procedure: Pacemaker Implant;  Surgeon: Duke Salvia, MD;  Location: [facility] INVASIVE CV LAB;  Service: Cardiovascular;  Laterality: N/A;     Current  Outpatient Prescriptions  Medication Sig Dispense Refill  . acetaminophen (TYLENOL) 500 MG tablet Take 500 mg by mouth every 6 (six) hours as needed for mild pain.    Marland Kitchen. amLODipine (NORVASC) 5 MG tablet Take 5 mg by mouth every morning.    Marland Kitchen. aspirin EC 81 MG tablet Take 81 mg by mouth daily.    . celecoxib (CELEBREX) 100 MG capsule Take 100 mg by mouth daily.    . Cholecalciferol (VITAMIN D3 SUPER STRENGTH) 2000 UNITS CAPS Take 2,000 Units by mouth daily.    . citalopram (CELEXA) 20 MG tablet Take 20 mg by mouth at bedtime.    . furosemide (LASIX) 20 MG tablet Take 20 mg by mouth daily.    Marland Kitchen. levothyroxine (SYNTHROID,  LEVOTHROID) 88 MCG tablet Take 88 mcg by mouth daily before breakfast.    . loratadine (CLARITIN) 10 MG tablet Take 10 mg by mouth daily.    . metoprolol (LOPRESSOR) 50 MG tablet Take 1 tablet (50 mg total) by mouth 2 (two) times daily. 60 tablet 6  . Multiple Vitamin (MULTIVITAMIN WITH MINERALS) TABS tablet Take 1 tablet by mouth daily.    Marland Kitchen. omeprazole (PRILOSEC) 20 MG capsule Take 20 mg by mouth daily.    . valsartan (DIOVAN) 160 MG tablet Take 160 mg by mouth daily.    . vitamin B-12 (CYANOCOBALAMIN) 1000 MCG tablet Take 1,000 mcg by mouth daily.     No current facility-administered medications for this visit.    Allergies:   Review of patient's allergies indicates no known allergies.    Social History:  The patient  reports that she has never smoked. She does not have any smokeless tobacco history on file. She reports that she does not drink alcohol or use illicit drugs.   Family History:  The patient's family history includes Hypertension in her father and mother. There is no history of Heart attack or Stroke.    ROS:  Please see the history of present illness.   Otherwise, review of systems are positive for NONE.   All other systems are reviewed and negative.    PHYSICAL EXAM: VS:  BP 138/60 mmHg  Pulse 80  Ht 5\' 2"  (1.575 m)  Wt 138 lb (62.596 kg)  BMI 25.23 kg/m2 , BMI Body mass index is 25.23 kg/(m^2). GEN: Well nourished, well developed, in no acute distress HEENT: normal Neck: no JVD, carotid bruits, or masses Cardiac: RRR; no murmurs, rubs, or gallops, trace bilateral LE edema  Respiratory:  clear to auscultation bilaterally, normal work of breathing GI: soft, nontender, nondistended, + BS MS: no deformity or atrophy Skin: warm and dry, no rash Neuro:  Strength and sensation are intact Psych: euthymic mood, full affect   EKG:  EKG is not ordered today.   Recent Labs: 04/08/2015: ALT 15; B Natriuretic Peptide 951.7* 04/13/2015: BUN 16; Creatinine, Ser 1.03*;  Hemoglobin 9.7*; Platelets 249; Potassium 4.4; Sodium 137    Lipid Panel No results found for: CHOL, TRIG, HDL, CHOLHDL, VLDL, LDLCALC, LDLDIRECT    Wt Readings from Last 3 Encounters:  05/03/15 138 lb (62.596 kg)  04/14/15 147 lb 7.8 oz (66.9 kg)      Other studies Reviewed: Additional studies/ records that were reviewed today include: 2D ECHO. Review of the above records demonstrates:  2D Echo 03/2015: EF 60-65%, mild AS and AI, mildly dilated LA, mild diastolic dysfunction    ASSESSMENT AND PLAN:  Summer Graham is a 81 y.o. female HTN, COPD on 02 , Parkinson's  diease and CHF who presents to clinic for post hospital follow up after a recent admission for 2:1 AV block s/p PPM and acute CHF.   Acute on chronic diastolic CHF  - Discharge weight 147lbs. Today weight 138 lbs.  - She appears euvolemic. Continue Lasix  po qd.  - 2D Echo 03/2015: EF 60-65%, mild AS and AI, mildly dilated LA, mild diastolic dysfunction   Bradycardia secondary to 2:1 Heart Block  - HR noted to be in 30's - 70's while in the ED. She was on metoprolol  BID which was discontinued upon admission. Her heart block did not resolve with complete washout and permanent pacemaker was felt to be indicated. Biotronik pulse generator: Serial # [id number] was successfully implanted on 04/12/15.  - Wound without hematoma, CXR without ptx and device interrogation reviewed and normal   Iron def anemia- Hgb 9.4 at time of admission. Ferritin 15 which is low normal and less consistent with anemia of chronic disease.  - Continue iron and colace   COPD- continue home O2 and albuterol Nebulizer PRN   HTN - BP 138/60 today. Continue amlodipine  and irbesartan  and Metoprolol 50 mg BID   Acute on CKD stage III- now resolved. Creat bumped with IV diuresis. Baseline creat ~1 and GFR 44.  Restless legs/sleep trouble- she said diazepam has helped a lot in the past and she just has so much trouble  getting to sleep. I provided her a written Rx for ativan 0.5mg   1/2 tablet po PRN as need for sleep. For further refils she should talk to Dr. Pete Glatter  Current medicines are reviewed at length with the patient today.  The patient does not have concerns regarding medicines.  The following changes have been made:  Gave her a very small script for a short acting benzo to help her to get to sleep.   Labs/ tests ordered today include:  No orders of the defined types were placed in this encounter.     Disposition:   FU with 3 months with Dr. Allyson Sabal. She has an appointment with Dr. Elberta Fortis in February Signed, THOMPSON, KATHRYN R, [place]  05/03/2015 11:57 AM    [facility] [address] [place], [place], [place]  [address] Phone: [phone]; Fax: [phone]

## 2015-05-03 ENCOUNTER — Ambulatory Visit (INDEPENDENT_AMBULATORY_CARE_PROVIDER_SITE_OTHER): Payer: Medicare Other | Admitting: Physician Assistant

## 2015-05-03 ENCOUNTER — Encounter: Payer: Self-pay | Admitting: Physician Assistant

## 2015-05-03 VITALS — BP 138/60 | HR 80 | Ht 62.0 in | Wt 138.0 lb

## 2015-05-03 DIAGNOSIS — I5032 Chronic diastolic (congestive) heart failure: Secondary | ICD-10-CM | POA: Diagnosis not present

## 2015-05-03 MED ORDER — LORAZEPAM 0.5 MG PO TABS: ORAL_TABLET | ORAL | Status: DC

## 2015-05-03 NOTE — Patient Instructions (Signed)
Medication Instructions:  Your physician has recommended you make the following change in your medication:  1) START Ativan 0.25 mg (Half of 0.5 mg tablet) by mouth at bedtime as needed for sleep  Labwork: none  Testing/Procedures: none  Follow-Up: Your physician recommends that you schedule a follow-up appointment in: 3 months with Dr. Allyson SabalBerry.   Any Other Special Instructions Will Be Listed Below (If Applicable).     If you need a refill on your cardiac medications before your next appointment, please call your pharmacy.

## 2015-06-25 ENCOUNTER — Inpatient Hospital Stay (HOSPITAL_COMMUNITY)
Admission: EM | Admit: 2015-06-25 | Discharge: 2015-07-01 | DRG: 190 | Disposition: A | Discharge status: Skilled Nursing Facility | Payer: Medicare Other | Attending: Internal Medicine | Admitting: Internal Medicine

## 2015-06-25 ENCOUNTER — Emergency Department (HOSPITAL_COMMUNITY): Payer: Medicare Other

## 2015-06-25 ENCOUNTER — Encounter (HOSPITAL_COMMUNITY): Payer: Self-pay | Admitting: Emergency Medicine

## 2015-06-25 DIAGNOSIS — Z993 Dependence on wheelchair: Secondary | ICD-10-CM

## 2015-06-25 DIAGNOSIS — J9621 Acute and chronic respiratory failure with hypoxia: Secondary | ICD-10-CM | POA: Diagnosis present

## 2015-06-25 DIAGNOSIS — Z7982 Long term (current) use of aspirin: Secondary | ICD-10-CM

## 2015-06-25 DIAGNOSIS — R0902 Hypoxemia: Secondary | ICD-10-CM

## 2015-06-25 DIAGNOSIS — Z66 Do not resuscitate: Secondary | ICD-10-CM | POA: Diagnosis present

## 2015-06-25 DIAGNOSIS — Z8249 Family history of ischemic heart disease and other diseases of the circulatory system: Secondary | ICD-10-CM

## 2015-06-25 DIAGNOSIS — Z79899 Other long term (current) drug therapy: Secondary | ICD-10-CM

## 2015-06-25 DIAGNOSIS — Z9981 Dependence on supplemental oxygen: Secondary | ICD-10-CM

## 2015-06-25 DIAGNOSIS — R0602 Shortness of breath: Secondary | ICD-10-CM | POA: Diagnosis not present

## 2015-06-25 DIAGNOSIS — I441 Atrioventricular block, second degree: Secondary | ICD-10-CM | POA: Diagnosis present

## 2015-06-25 DIAGNOSIS — N189 Chronic kidney disease, unspecified: Secondary | ICD-10-CM | POA: Diagnosis present

## 2015-06-25 DIAGNOSIS — J440 Chronic obstructive pulmonary disease with acute lower respiratory infection: Secondary | ICD-10-CM | POA: Diagnosis not present

## 2015-06-25 DIAGNOSIS — Z950 Presence of cardiac pacemaker: Secondary | ICD-10-CM

## 2015-06-25 DIAGNOSIS — I130 Hypertensive heart and chronic kidney disease with heart failure and stage 1 through stage 4 chronic kidney disease, or unspecified chronic kidney disease: Secondary | ICD-10-CM | POA: Diagnosis present

## 2015-06-25 DIAGNOSIS — J189 Pneumonia, unspecified organism: Secondary | ICD-10-CM | POA: Diagnosis present

## 2015-06-25 DIAGNOSIS — Z6825 Body mass index (BMI) 25.0-25.9, adult: Secondary | ICD-10-CM

## 2015-06-25 DIAGNOSIS — I10 Essential (primary) hypertension: Secondary | ICD-10-CM | POA: Diagnosis present

## 2015-06-25 DIAGNOSIS — R059 Cough, unspecified: Secondary | ICD-10-CM

## 2015-06-25 DIAGNOSIS — Z9049 Acquired absence of other specified parts of digestive tract: Secondary | ICD-10-CM

## 2015-06-25 DIAGNOSIS — I5032 Chronic diastolic (congestive) heart failure: Secondary | ICD-10-CM | POA: Diagnosis present

## 2015-06-25 DIAGNOSIS — R05 Cough: Secondary | ICD-10-CM

## 2015-06-25 DIAGNOSIS — D509 Iron deficiency anemia, unspecified: Secondary | ICD-10-CM | POA: Diagnosis present

## 2015-06-25 DIAGNOSIS — G20 Parkinson's disease: Secondary | ICD-10-CM | POA: Diagnosis present

## 2015-06-25 DIAGNOSIS — Z90710 Acquired absence of both cervix and uterus: Secondary | ICD-10-CM

## 2015-06-25 DIAGNOSIS — Y95 Nosocomial condition: Secondary | ICD-10-CM | POA: Diagnosis present

## 2015-06-25 DIAGNOSIS — J181 Lobar pneumonia, unspecified organism: Secondary | ICD-10-CM

## 2015-06-25 LAB — CBC WITH DIFFERENTIAL/PLATELET
BASOS PCT: 0 %
Basophils Absolute: 0 10*3/uL (ref 0.0–0.1)
EOS ABS: 0.3 10*3/uL (ref 0.0–0.7)
Eosinophils Relative: 4 %
HEMATOCRIT: 34.7 % — AB (ref 36.0–46.0)
HEMOGLOBIN: 11 g/dL — AB (ref 12.0–15.0)
LYMPHS ABS: 1.3 10*3/uL (ref 0.7–4.0)
Lymphocytes Relative: 17 %
MCH: 29.2 pg (ref 26.0–34.0)
MCHC: 31.7 g/dL (ref 30.0–36.0)
MCV: 92 fL (ref 78.0–100.0)
MONOS PCT: 5 %
Monocytes Absolute: 0.4 10*3/uL (ref 0.1–1.0)
NEUTROS ABS: 5.8 10*3/uL (ref 1.7–7.7)
NEUTROS PCT: 74 %
Platelets: 220 10*3/uL (ref 150–400)
RBC: 3.77 MIL/uL — AB (ref 3.87–5.11)
RDW: 20.2 % — ABNORMAL HIGH (ref 11.5–15.5)
WBC: 7.8 10*3/uL (ref 4.0–10.5)

## 2015-06-25 LAB — BASIC METABOLIC PANEL
ANION GAP: 9 (ref 5–15)
BUN: 23 mg/dL — ABNORMAL HIGH (ref 6–20)
CHLORIDE: 103 mmol/L (ref 101–111)
CO2: 29 mmol/L (ref 22–32)
Calcium: 9.3 mg/dL (ref 8.9–10.3)
Creatinine, Ser: 0.88 mg/dL (ref 0.44–1.00)
GFR calc non Af Amer: 53 mL/min — ABNORMAL LOW (ref >60)
Glucose, Bld: 119 mg/dL — ABNORMAL HIGH (ref 65–99)
POTASSIUM: 4.1 mmol/L (ref 3.5–5.1)
SODIUM: 141 mmol/L (ref 135–145)

## 2015-06-25 LAB — BRAIN NATRIURETIC PEPTIDE: B NATRIURETIC PEPTIDE 5: 473.5 pg/mL — AB (ref 0.0–100.0)

## 2015-06-25 NOTE — ED Notes (Signed)
Patient transported to X-ray 

## 2015-06-25 NOTE — ED Provider Notes (Signed)
CSN: [id number]     Arrival date & time 06/25/15  2146 History   First MD Initiated Contact with Patient 06/25/15 2157     Chief Complaint  Patient presents with  . Difficulty breathing; low o2 saturation    HPI  Summer Graham is a 82 year old female with PMHx of HTN, CHF, COPD and CKD presenting with SOB. She presents from her nursing facility. She states she has had increased SOB over the past 2 days. She is wheelchair bound but notes that she has new SOB when she is eating. She does note increased SOB when she lays flat and has to be propped up. She also endorses dry cough over the past 2 days. She reports some clear mucus production but denies purulent sputum. Her facility reports that she is on 4L oxygen at baseline. They noted that she was hypoxic to 83% at her baseline oxygen level. EMS noted wheezing in her lower lobes and gave her albuterool and 125 solumedrol en route. She denies recent fevers, chills, headaches, dizziness, syncope, neck pain, chest pain, abdominal pain, nausea, vomiting, diarrhea, dysuria, increased frequency, or lower extremity swelling. She has no history of DVT or PE. Denies sick contacts.    Past Medical History  Diagnosis Date  . Hypertension   . Chronic diastolic CHF (congestive heart failure) (HCC)     a. 03/2015: 2D ECHO: EF 60-65%, mild AS and AI, mildly dilated LA, mild diastolic dysfunction   . Parkinson's disease (HCC)   . COPD (chronic obstructive pulmonary disease) (HCC)     4L O2 baseline  . Heart block AV second degree     a. s/p Biotronik pulse generator: Serial # [id number] was successfully implanted on 04/12/15.   . S/P placement of cardiac pacemaker     a. Biotronik pulse generator: Serial # [id number] was successfully implanted on 04/12/15.   . Iron deficiency anemia     a. placed on iron and colase during 03/2016 admission   . CKD (chronic kidney disease)    Past Surgical History  Procedure Laterality Date  . Appendectomy      At age 82  . Total  abdominal hysterectomy      Age 24  . Ep implantable device N/A 04/12/2015    Procedure: Pacemaker Implant;  Surgeon: Duke Salvia, MD;  Location: [facility] INVASIVE CV LAB;  Service: Cardiovascular;  Laterality: N/A;   Family History  Problem Relation Age of Onset  . Hypertension Mother   . Hypertension Father   . Heart attack Neg Hx   . Stroke Neg Hx    Social History  Substance Use Topics  . Smoking status: Never Smoker   . Smokeless tobacco: None  . Alcohol Use: No   OB History    No data available     Review of Systems  Respiratory: Positive for cough and shortness of breath.   All other systems reviewed and are negative.     Allergies  Review of patient's allergies indicates no known allergies.  Home Medications   Prior to Admission medications   Medication Sig Start Date End Date Taking? Authorizing Provider  acetaminophen (TYLENOL) 500 MG tablet Take 500 mg by mouth every 6 (six) hours as needed for mild pain.   Yes Historical Provider, MD  amLODipine (NORVASC) 5 MG tablet Take 5 mg by mouth every morning.   Yes Historical Provider, MD  aspirin EC 81 MG tablet Take 81 mg by mouth daily.   Yes Historical Provider,  MD  celecoxib (CELEBREX) 100 MG capsule Take 100 mg by mouth daily.   Yes Historical Provider, MD  Cholecalciferol (VITAMIN D3 SUPER STRENGTH) 2000 UNITS CAPS Take 2,000 Units by mouth daily.   Yes Historical Provider, MD  citalopram (CELEXA) 20 MG tablet Take 20 mg by mouth daily.    Yes Historical Provider, MD  docusate sodium (COLACE) 100 MG capsule Take 100 mg by mouth 2 (two) times daily.   Yes Historical Provider, MD  ferrous sulfate 325 (65 FE) MG tablet Take 325 mg by mouth daily.   Yes Historical Provider, MD  furosemide (LASIX) 20 MG tablet Take 20 mg by mouth daily.   Yes Historical Provider, MD  HYDROcodone-acetaminophen (NORCO/VICODIN) 5-325 MG tablet Take 1 tablet by mouth 3 (three) times daily as needed for moderate pain.   Yes Historical  Provider, MD  levothyroxine (SYNTHROID, LEVOTHROID) 88 MCG tablet Take 88 mcg by mouth daily before breakfast.   Yes Historical Provider, MD  LORazepam (ATIVAN) 0.5 MG tablet Take HALF tablet by mouth, at bedtime, as needed for sleep. Patient taking differently: Take 0.25 mg by mouth at bedtime as needed for sleep. Take HALF tablet by mouth, at bedtime, as needed for sleep. 05/03/15  Yes Janetta Hora, PA-C  metoprolol (LOPRESSOR) 50 MG tablet Take 1 tablet (50 mg total) by mouth 2 (two) times daily. 04/14/15  Yes Janetta Hora, PA-C  Multiple Vitamin (MULTIVITAMIN WITH MINERALS) TABS tablet Take 1 tablet by mouth daily.   Yes Historical Provider, MD  omeprazole (PRILOSEC) 20 MG capsule Take 20 mg by mouth daily.   Yes Historical Provider, MD  valsartan (DIOVAN) 160 MG tablet Take 160 mg by mouth daily.   Yes Historical Provider, MD  vitamin B-12 (CYANOCOBALAMIN) 1000 MCG tablet Take 1,000 mcg by mouth daily.   Yes Historical Provider, MD  vitamin C (ASCORBIC ACID) 500 MG tablet Take 500 mg by mouth daily.   Yes Historical Provider, MD   BP 150/66 mmHg  Pulse 67  Temp(Src) 97.8 F (36.6 C) (Oral)  Resp 22  Ht  (1.575 m)  Wt 62.596 kg  BMI 25.23 kg/m2  SpO2 97% Physical Exam  Constitutional: She appears well-developed and well-nourished. No distress.  HENT:  Head: Normocephalic and atraumatic.  Eyes: Conjunctivae are normal. Right eye exhibits no discharge. Left eye exhibits no discharge. No scleral icterus.  Neck: Normal range of motion.  Cardiovascular: Normal rate and regular rhythm.   Pulmonary/Chest: Effort normal. No respiratory distress. She has rales.  Pt in high 80s on 8L nasal canula. Placed pt on nonrebreather at 10 L and O2 improved to 93%. No respiratory distress. Breathing unlabored. Rhonchi in all lung fields.   Abdominal: Soft. There is no tenderness.  Musculoskeletal: Normal range of motion.  Neurological: She is alert. Coordination normal.  Skin: Skin is  warm and dry.  Psychiatric: She has a normal mood and affect. Her behavior is normal.  Nursing note and vitals reviewed.   ED Course  Procedures (including critical care time) Labs Review Labs Reviewed  CBC WITH DIFFERENTIAL/PLATELET - Abnormal; Notable for the following:    RBC 3.77 (*)    Hemoglobin 11.0 (*)    HCT 34.7 (*)    RDW 20.2 (*)    All other components within normal limits  BASIC METABOLIC PANEL - Abnormal; Notable for the following:    Glucose, Bld 119 (*)    BUN 23 (*)    GFR calc non Af Amer 53 (*)  All other components within normal limits  BRAIN NATRIURETIC PEPTIDE - Abnormal; Notable for the following:    B Natriuretic Peptide 473.5 (*)    All other components within normal limits  URINALYSIS, ROUTINE W REFLEX MICROSCOPIC (NOT AT [facility])  BLOOD GAS, ARTERIAL    Imaging Review Dg Chest 2 View  06/25/2015  CLINICAL DATA:  Cough and shortness of breath for several days. Nonsmoker. History of hypertension, CHF, COPD. EXAM: CHEST  2 VIEW COMPARISON:  04/13/2015 FINDINGS: Cardiac pacemaker. Shallow inspiration with atelectasis in the lung bases. Mild cardiac enlargement without significant vascular congestion. Small bilateral pleural effusions. No focal consolidation in the lungs. Calcified and tortuous aorta. No pneumothorax. IMPRESSION: Shallow inspiration with small bilateral pleural effusions and basilar atelectasis. Cardiac enlargement. Electronically Signed   By: Burman Nieves M.D.   On: 06/25/2015 22:23   I have personally reviewed and evaluated these images and lab results as part of my medical decision-making.   EKG Interpretation   Date/Time:  Friday June 25 2015 22:02:49 EST Ventricular Rate:  67 PR Interval:  123 QRS Duration: 164 QT Interval:  497 QTC Calculation: 525 R Axis:   -122 Text Interpretation:  Atrial-ventricular dual-paced rhythm No further  analysis attempted due to paced rhythm Baseline wander in lead(s) V1  Confirmed by HORTON   MD, COURTNEY ([id number]) on 06/26/2015 12:06:19 AM      MDM   Final diagnoses:  Hypoxia  Cough   82 year old female presenting from nursing facility with hypoxia and dry cough x 2 days. Nursing facility noted pt to be hypoxic to 83% on her baseline 4 L oxygen. Pt placed on 5.5 L oxygen with sats at 89%. Remaining vitals stable. Pt is nontoxic appearing. Rhonchi heard throughout all lung fields. No increased work of breathing. Pt had received albuterol and 125 solumedrol in EMS for wheezing in lower lung fields. CXR positive for small bilateral pleural effusions and basilar atelectasis. BNP mildly elevated to 473. Remaining blood work at baseline. Pt will need to be admitted for further evaluation of her hypoxia. She is pending a CTA to rule out PE and ABG. Pt care signed out to oncoming provider, Dr. Wilkie Aye.     Rolm Gala Francys Bolin, PA-C 06/26/15 0102  Shon Baton, MD 07/14/15 [phone]

## 2015-06-25 NOTE — ED Notes (Signed)
Per EMS, facility called for breathing difficulty and low o2 saturation  - 83% on 4 L (normally on 4 L) They have patient Robitussin. Wheezing in lower lobes. Albuterol given en route; 125 solumedrol given en route 97% on treatment. Patient from [facility] facility.

## 2015-06-25 NOTE — ED Notes (Signed)
Bed: ZO10 Expected date:  Expected time:  Means of arrival:  Comments: EMS 82yo F breathing difficutly / cough

## 2015-06-26 ENCOUNTER — Encounter (HOSPITAL_COMMUNITY): Payer: Self-pay | Admitting: Internal Medicine

## 2015-06-26 ENCOUNTER — Emergency Department (HOSPITAL_COMMUNITY): Payer: Medicare Other

## 2015-06-26 DIAGNOSIS — Z950 Presence of cardiac pacemaker: Secondary | ICD-10-CM | POA: Diagnosis not present

## 2015-06-26 DIAGNOSIS — Y95 Nosocomial condition: Secondary | ICD-10-CM | POA: Diagnosis present

## 2015-06-26 DIAGNOSIS — R0602 Shortness of breath: Secondary | ICD-10-CM | POA: Diagnosis present

## 2015-06-26 DIAGNOSIS — I5032 Chronic diastolic (congestive) heart failure: Secondary | ICD-10-CM

## 2015-06-26 DIAGNOSIS — D509 Iron deficiency anemia, unspecified: Secondary | ICD-10-CM | POA: Diagnosis present

## 2015-06-26 DIAGNOSIS — Z90710 Acquired absence of both cervix and uterus: Secondary | ICD-10-CM | POA: Diagnosis not present

## 2015-06-26 DIAGNOSIS — I10 Essential (primary) hypertension: Secondary | ICD-10-CM

## 2015-06-26 DIAGNOSIS — Z8249 Family history of ischemic heart disease and other diseases of the circulatory system: Secondary | ICD-10-CM | POA: Diagnosis not present

## 2015-06-26 DIAGNOSIS — J181 Lobar pneumonia, unspecified organism: Secondary | ICD-10-CM

## 2015-06-26 DIAGNOSIS — J440 Chronic obstructive pulmonary disease with acute lower respiratory infection: Secondary | ICD-10-CM | POA: Diagnosis present

## 2015-06-26 DIAGNOSIS — I130 Hypertensive heart and chronic kidney disease with heart failure and stage 1 through stage 4 chronic kidney disease, or unspecified chronic kidney disease: Secondary | ICD-10-CM | POA: Diagnosis present

## 2015-06-26 DIAGNOSIS — J189 Pneumonia, unspecified organism: Secondary | ICD-10-CM | POA: Diagnosis present

## 2015-06-26 DIAGNOSIS — N189 Chronic kidney disease, unspecified: Secondary | ICD-10-CM | POA: Diagnosis present

## 2015-06-26 DIAGNOSIS — G20 Parkinson's disease: Secondary | ICD-10-CM | POA: Diagnosis present

## 2015-06-26 DIAGNOSIS — Z993 Dependence on wheelchair: Secondary | ICD-10-CM | POA: Diagnosis not present

## 2015-06-26 DIAGNOSIS — Z9981 Dependence on supplemental oxygen: Secondary | ICD-10-CM | POA: Diagnosis not present

## 2015-06-26 DIAGNOSIS — Z7982 Long term (current) use of aspirin: Secondary | ICD-10-CM | POA: Diagnosis not present

## 2015-06-26 DIAGNOSIS — Z6825 Body mass index (BMI) 25.0-25.9, adult: Secondary | ICD-10-CM | POA: Diagnosis not present

## 2015-06-26 DIAGNOSIS — Z66 Do not resuscitate: Secondary | ICD-10-CM | POA: Diagnosis present

## 2015-06-26 DIAGNOSIS — Z79899 Other long term (current) drug therapy: Secondary | ICD-10-CM | POA: Diagnosis not present

## 2015-06-26 DIAGNOSIS — Z9049 Acquired absence of other specified parts of digestive tract: Secondary | ICD-10-CM | POA: Diagnosis not present

## 2015-06-26 DIAGNOSIS — J9621 Acute and chronic respiratory failure with hypoxia: Secondary | ICD-10-CM | POA: Diagnosis present

## 2015-06-26 DIAGNOSIS — I441 Atrioventricular block, second degree: Secondary | ICD-10-CM | POA: Diagnosis present

## 2015-06-26 HISTORY — DX: Pneumonia, unspecified organism: J18.9

## 2015-06-26 LAB — URINALYSIS, ROUTINE W REFLEX MICROSCOPIC
BILIRUBIN URINE: NEGATIVE
GLUCOSE, UA: NEGATIVE mg/dL
HGB URINE DIPSTICK: NEGATIVE
KETONES UR: NEGATIVE mg/dL
NITRITE: NEGATIVE
PH: 5.5 (ref 5.0–8.0)
Protein, ur: NEGATIVE mg/dL
Specific Gravity, Urine: 1.018 (ref 1.005–1.030)

## 2015-06-26 LAB — LACTIC ACID, PLASMA
LACTIC ACID, VENOUS: 1 mmol/L (ref 0.5–2.0)
LACTIC ACID, VENOUS: 1.3 mmol/L (ref 0.5–2.0)

## 2015-06-26 LAB — BLOOD GAS, ARTERIAL
Acid-Base Excess: 2.4 mmol/L — ABNORMAL HIGH (ref 0.0–2.0)
Bicarbonate: 25.9 mEq/L — ABNORMAL HIGH (ref 20.0–24.0)
Drawn by: 235321
O2 Content: 6 L/min
O2 Saturation: 88.3 %
PATIENT TEMPERATURE: 97.8
PCO2 ART: 36.7 mmHg (ref 35.0–45.0)
PH ART: 7.459 — AB (ref 7.350–7.450)
PO2 ART: 53.7 mmHg — AB (ref 80.0–100.0)
TCO2: 23.5 mmol/L (ref 0–100)

## 2015-06-26 LAB — URINE MICROSCOPIC-ADD ON

## 2015-06-26 LAB — HIV ANTIBODY (ROUTINE TESTING W REFLEX): HIV SCREEN 4TH GENERATION: NONREACTIVE

## 2015-06-26 LAB — MRSA PCR SCREENING: MRSA BY PCR: NEGATIVE

## 2015-06-26 LAB — STREP PNEUMONIAE URINARY ANTIGEN: Strep Pneumo Urinary Antigen: NEGATIVE

## 2015-06-26 MED ORDER — AMLODIPINE BESYLATE 5 MG PO TABS
5.0000 mg | ORAL_TABLET | Freq: Every morning | ORAL | Status: DC
  Administered 2015-06-26 – 2015-07-01 (×6): 5 mg via ORAL
  Filled 2015-06-26 (×7): qty 1

## 2015-06-26 MED ORDER — DOCUSATE SODIUM 100 MG PO CAPS: 100.0000 mg | ORAL_CAPSULE | Freq: Two times a day (BID) | ORAL | Status: DC

## 2015-06-26 MED ORDER — VANCOMYCIN HCL IN DEXTROSE 1-5 GM/200ML-% IV SOLN
1000.0000 mg | Freq: Once | INTRAVENOUS | Status: AC
  Administered 2015-06-26: 1000 mg via INTRAVENOUS
  Filled 2015-06-26: qty 200

## 2015-06-26 MED ORDER — IPRATROPIUM-ALBUTEROL 0.5-2.5 (3) MG/3ML IN SOLN
3.0000 mL | Freq: Three times a day (TID) | RESPIRATORY_TRACT | Status: DC
  Administered 2015-06-27 – 2015-06-29 (×7): 3 mL via RESPIRATORY_TRACT
  Filled 2015-06-26 (×7): qty 3

## 2015-06-26 MED ORDER — GUAIFENESIN ER 600 MG PO TB12
600.0000 mg | ORAL_TABLET | Freq: Two times a day (BID) | ORAL | Status: DC
  Administered 2015-06-26 – 2015-07-01 (×10): 600 mg via ORAL
  Filled 2015-06-26 (×10): qty 1

## 2015-06-26 MED ORDER — DEXTROSE 5 % IV SOLN
1.0000 g | INTRAVENOUS | Status: AC
  Administered 2015-06-26: 1 g via INTRAVENOUS
  Filled 2015-06-26: qty 1

## 2015-06-26 MED ORDER — ENOXAPARIN SODIUM 40 MG/0.4ML ~~LOC~~ SOLN
40.0000 mg | SUBCUTANEOUS | Status: DC
  Administered 2015-06-26 – 2015-07-01 (×6): 40 mg via SUBCUTANEOUS
  Filled 2015-06-26 (×7): qty 0.4

## 2015-06-26 MED ORDER — PANTOPRAZOLE SODIUM 40 MG PO TBEC
40.0000 mg | DELAYED_RELEASE_TABLET | Freq: Every day | ORAL | Status: DC
  Administered 2015-06-26 – 2015-07-01 (×6): 40 mg via ORAL
  Filled 2015-06-26 (×6): qty 1

## 2015-06-26 MED ORDER — IPRATROPIUM-ALBUTEROL 0.5-2.5 (3) MG/3ML IN SOLN
3.0000 mL | Freq: Three times a day (TID) | RESPIRATORY_TRACT | Status: DC
  Administered 2015-06-26: 3 mL via RESPIRATORY_TRACT
  Filled 2015-06-26: qty 3

## 2015-06-26 MED ORDER — LORAZEPAM 0.5 MG PO TABS: 0.2500 mg | ORAL_TABLET | Freq: Every evening | ORAL | Status: DC | PRN

## 2015-06-26 MED ORDER — HYDROCODONE-ACETAMINOPHEN 5-325 MG PO TABS
1.0000 | ORAL_TABLET | Freq: Three times a day (TID) | ORAL | Status: DC | PRN
  Administered 2015-06-27: 1 via ORAL
  Filled 2015-06-26: qty 1

## 2015-06-26 MED ORDER — DEXTROSE 5 % IV SOLN
1.0000 g | INTRAVENOUS | Status: DC
  Administered 2015-06-27 – 2015-06-29 (×3): 1 g via INTRAVENOUS
  Filled 2015-06-26 (×3): qty 1

## 2015-06-26 MED ORDER — CELECOXIB 100 MG PO CAPS
100.0000 mg | ORAL_CAPSULE | Freq: Every day | ORAL | Status: DC
  Administered 2015-06-26 – 2015-07-01 (×6): 100 mg via ORAL
  Filled 2015-06-26 (×6): qty 1

## 2015-06-26 MED ORDER — CETYLPYRIDINIUM CHLORIDE 0.05 % MT LIQD
7.0000 mL | Freq: Two times a day (BID) | OROMUCOSAL | Status: DC
  Administered 2015-06-27 – 2015-07-01 (×5): 7 mL via OROMUCOSAL

## 2015-06-26 MED ORDER — ASPIRIN EC 81 MG PO TBEC
81.0000 mg | DELAYED_RELEASE_TABLET | Freq: Every day | ORAL | Status: DC
  Administered 2015-06-26 – 2015-07-01 (×6): 81 mg via ORAL
  Filled 2015-06-26 (×7): qty 1

## 2015-06-26 MED ORDER — CHLORHEXIDINE GLUCONATE 0.12 % MT SOLN
15.0000 mL | Freq: Two times a day (BID) | OROMUCOSAL | Status: DC
  Administered 2015-06-26 – 2015-07-01 (×10): 15 mL via OROMUCOSAL
  Filled 2015-06-26 (×7): qty 15

## 2015-06-26 MED ORDER — METOPROLOL TARTRATE 50 MG PO TABS
50.0000 mg | ORAL_TABLET | Freq: Two times a day (BID) | ORAL | Status: DC
  Administered 2015-06-26 – 2015-07-01 (×12): 50 mg via ORAL
  Filled 2015-06-26 (×8): qty 1
  Filled 2015-06-26: qty 2
  Filled 2015-06-26 (×3): qty 1

## 2015-06-26 MED ORDER — IRBESARTAN 150 MG PO TABS
150.0000 mg | ORAL_TABLET | Freq: Every day | ORAL | Status: DC
Start: 2015-06-26 — End: 2015-07-01
  Administered 2015-06-26 – 2015-07-01 (×6): 150 mg via ORAL
  Filled 2015-06-26 (×7): qty 1

## 2015-06-26 MED ORDER — CITALOPRAM HYDROBROMIDE 20 MG PO TABS
20.0000 mg | ORAL_TABLET | Freq: Every day | ORAL | Status: DC
  Administered 2015-06-26 – 2015-07-01 (×6): 20 mg via ORAL
  Filled 2015-06-26 (×7): qty 1

## 2015-06-26 MED ORDER — VITAMIN D3 25 MCG (1000 UNIT) PO TABS
2000.0000 [IU] | ORAL_TABLET | Freq: Every day | ORAL | Status: DC
  Administered 2015-06-26 – 2015-07-01 (×6): 2000 [IU] via ORAL
  Filled 2015-06-26 (×12): qty 2

## 2015-06-26 MED ORDER — IOHEXOL 350 MG/ML SOLN
100.0000 mL | Freq: Once | INTRAVENOUS | Status: AC | PRN
  Administered 2015-06-26: 100 mL via INTRAVENOUS

## 2015-06-26 MED ORDER — VANCOMYCIN HCL IN DEXTROSE 1-5 GM/200ML-% IV SOLN
1000.0000 mg | INTRAVENOUS | Status: DC
  Administered 2015-06-27: 1000 mg via INTRAVENOUS
  Filled 2015-06-26: qty 200

## 2015-06-26 MED ORDER — ACETAMINOPHEN 500 MG PO TABS
500.0000 mg | ORAL_TABLET | Freq: Four times a day (QID) | ORAL | Status: DC | PRN
  Administered 2015-06-28: 500 mg via ORAL
  Filled 2015-06-26: qty 1

## 2015-06-26 MED ORDER — FUROSEMIDE 20 MG PO TABS
20.0000 mg | ORAL_TABLET | Freq: Every day | ORAL | Status: DC
  Administered 2015-06-26 – 2015-06-29 (×4): 20 mg via ORAL
  Filled 2015-06-26 (×4): qty 1

## 2015-06-26 MED ORDER — FERROUS SULFATE 325 (65 FE) MG PO TABS
325.0000 mg | ORAL_TABLET | Freq: Every day | ORAL | Status: DC
  Administered 2015-06-26 – 2015-07-01 (×6): 325 mg via ORAL
  Filled 2015-06-26 (×7): qty 1

## 2015-06-26 MED ORDER — DOCUSATE SODIUM 100 MG PO CAPS
100.0000 mg | ORAL_CAPSULE | Freq: Two times a day (BID) | ORAL | Status: DC
  Administered 2015-06-26 – 2015-07-01 (×11): 100 mg via ORAL
  Filled 2015-06-26 (×12): qty 1

## 2015-06-26 MED ORDER — LEVOTHYROXINE SODIUM 88 MCG PO TABS
88.0000 ug | ORAL_TABLET | Freq: Every day | ORAL | Status: DC
  Administered 2015-06-26 – 2015-07-01 (×6): 88 ug via ORAL
  Filled 2015-06-26 (×8): qty 1

## 2015-06-26 NOTE — ED Notes (Signed)
Per Tabitha, RT pt's O2 sats were in the 70's, non rebreather placed on pt.

## 2015-06-26 NOTE — Progress Notes (Signed)
Patient arrived on the unit at approximately [phone]. She is alert and verbally responsive and has a non rebreather mask in place at 15L. No complaints voiced at this time.

## 2015-06-26 NOTE — ED Notes (Addendum)
Attempted to obtain lab draw by phlebotomist. Unsuccessful attempt. Floor RN made aware. Patient being transported to floor currently. Main Lab will obtain lab work.

## 2015-06-26 NOTE — ED Notes (Signed)
MD at bedside. 

## 2015-06-26 NOTE — ED Notes (Signed)
Patient resting with eyes closed. In no acute distress. Patient on 4 L Os nasal cannula

## 2015-06-26 NOTE — Progress Notes (Signed)
Patient seen and examined, reported feeling better, was on NRB last night, now on 6liter, no increase work of breathing, no cough, lung exam mild crackles, mild wheezing, no edema. Add mucinex, nebs, check mrsa screen, sputum culture, narrow abx if continue to improve.

## 2015-06-26 NOTE — ED Notes (Signed)
Pt stated she cannot give a urine sample at this time. 

## 2015-06-26 NOTE — ED Provider Notes (Signed)
Medical screening examination/treatment/procedure(s) were conducted as a shared visit with non-physician practitioner(s) and myself.  I personally evaluated the patient during the encounter.   EKG Interpretation   Date/Time:  Friday June 25 2015 22:02:49 EST Ventricular Rate:  67 PR Interval:  123 QRS Duration: 164 QT Interval:  497 QTC Calculation: 525 R Axis:   -122 Text Interpretation:  Atrial-ventricular dual-paced rhythm No further  analysis attempted due to paced rhythm Baseline wander in lead(s) V1  Confirmed by Shamela Haydon  MD, Toni Amend ([id number]) on 06/26/2015 12:06:19 AM      This is a 82 year old female with history of CHF, COPD, CKD who presents with hypoxia. Patient reports feeling short of breath earlier this evening. She is at baseline on 4 L of home oxygen. She was noted be 84% on her baseline oxygen at her facility. Reports a dry cough. No fevers. Denies chest pain. She is nonambulatory but no history of DVT or PE.  No recent changes in medications. Was given steroids and an albuterol treatment by EMS. She's in no acute distress. Initially on a nonrebreather. I transitioned her to nasal cannula and she is requiring 5.5 L to maintain pulse ox 89% or greater. She is in no acute distress. Lung sounds with bilateral rhonchi. Chest x-ray with small bilateral pleural effusions. BNP only minimally elevated.  Hypoxia appears out of proportion to clinical exam and chest x-ray findings.  No evidence of psychosis. Will obtain CT angiogram to rule out PE. Otherwise, will cover with antibiotics and admit for further management.  ABG shows a PO2 of 55.  CTA shows no evidence of pulmonary embolus. Likely has a pneumonia. Patient was given cefepime and faint. Blood cultures obtained and patient will be admitted to the hospitalist.  Shon Baton, MD 06/26/15 [phone]

## 2015-06-26 NOTE — Progress Notes (Signed)
ANTIBIOTIC CONSULT NOTE - INITIAL  Pharmacy Consult for Vancomycin & Cefepime Indication: pneumonia  No Known Allergies  Patient Measurements: Height:  (157.5 cm) Weight: 138 lb (62.596 kg) IBW/kg (Calculated) : 50.1  Vital Signs: Temp: 97.8 F (36.6 C) (01/27 2157) Temp Source: Oral (01/27 2157) BP: 155/71 mmHg (01/28 0317) Pulse Rate: 69 (01/28 0317) Intake/Output from previous day:   Intake/Output from this shift:    Labs:  Recent Labs  06/25/15 2246  WBC 7.8  HGB 11.0*  PLT 220  CREATININE 0.88   Estimated Creatinine Clearance: 31.8 mL/min (by C-G formula based on Cr of 0.88). No results for input(s): VANCOTROUGH, VANCOPEAK, VANCORANDOM, GENTTROUGH, GENTPEAK, GENTRANDOM, TOBRATROUGH, TOBRAPEAK, TOBRARND, AMIKACINPEAK, AMIKACINTROU, AMIKACIN in the last 72 hours.   Microbiology: No results found for this or any previous visit (from the past 720 hour(s)).  Medical History: Past Medical History  Diagnosis Date  . Hypertension   . Chronic diastolic CHF (congestive heart failure) (HCC)     a. 03/2015: 2D ECHO: EF 60-65%, mild AS and AI, mildly dilated LA, mild diastolic dysfunction   . Parkinson's disease (HCC)   . COPD (chronic obstructive pulmonary disease) (HCC)     4L O2 baseline  . Heart block AV second degree     a. s/p Biotronik pulse generator: Serial # [id number] was successfully implanted on 04/12/15.   . S/P placement of cardiac pacemaker     a. Biotronik pulse generator: Serial # [id number] was successfully implanted on 04/12/15.   . Iron deficiency anemia     a. placed on iron and colase during 03/2016 admission   . CKD (chronic kidney disease)     Medications:  Scheduled:  . amLODipine  5 mg Oral q morning - 10a  . aspirin EC  81 mg Oral Daily  . celecoxib  100 mg Oral Daily  . Cholecalciferol  2,000 Units Oral Daily  . citalopram  20 mg Oral Daily  . docusate sodium  100 mg Oral BID  . enoxaparin (LOVENOX) injection  40 mg Subcutaneous  Q24H  . ferrous sulfate  325 mg Oral Daily  . furosemide  20 mg Oral Daily  . irbesartan  150 mg Oral Daily  . levothyroxine  88 mcg Oral QAC breakfast  . metoprolol  50 mg Oral BID  . pantoprazole  40 mg Oral Daily   Infusions:  . ceFEPime (MAXIPIME) IV    . vancomycin     Assessment:  82 yr female with h/o CHF, COPD, CKD with c/o hypoxia  CTAngio concerning for pneumonia  CrCl ~ 31 ml/min  Vancomycin 1gm IV x 1 ordered in ED  Pharmacy consulted to dose Cefepime and Vancomycin for HCAP  1/28 >>Vanc >> 1/28 >>Cefepime >>    1/28 blood:  Trough/Dose change info:   Goal of Therapy:  Vancomycin trough level 15-20 mcg/ml  Plan:  Measure antibiotic drug levels at steady state Follow up culture results  Cefepime 1gm IV q24h Vancomycin 1gm IV q24h  Mennie Spiller, Joselyn Glassman, PharmD 06/26/2015,3:24 AM

## 2015-06-26 NOTE — ED Notes (Signed)
Attempted to draw labs twice was unsuccessful. Mathis Dad made aware

## 2015-06-26 NOTE — H&P (Signed)
[facility] History and Physical  Summer Graham ZOX:[id number] DOB: 04/03/1916 DOA: 06/25/2015  Referring physician: EDP PCP: Summer Otto, MD   Chief Complaint: SOB   HPI: Summer Graham is a 82 y.o. female with h/o HTN, CHF, questionable COPD (patient says not, but she is on 4L O2 at baseline at [facility]).  Patient presents to the ED from her NH.  She has had increased SOB over the past 2 days.  She is wheelchair bound at baseline.  Has been coughing up some clear mucus.  4L O2 at baseline, when measured today she was hypoxic to 83%.  She has been requiring 5-6L O2 to maintain sats of 90% or so here in the ED.  Got albuterol and solumedrol en route due to wheezing in lower lobes.  Work up in ED demonstrates RLL PNA on the CT scan.  Review of Systems: Systems reviewed.  As above, otherwise negative  Past Medical History  Diagnosis Date  . Hypertension   . Chronic diastolic CHF (congestive heart failure) (HCC)     a. 03/2015: 2D ECHO: EF 60-65%, mild AS and AI, mildly dilated LA, mild diastolic dysfunction   . Parkinson's disease (HCC)   . COPD (chronic obstructive pulmonary disease) (HCC)     4L O2 baseline  . Heart block AV second degree     a. s/p Biotronik pulse generator: Serial # [id number] was successfully implanted on 04/12/15.   . S/P placement of cardiac pacemaker     a. Biotronik pulse generator: Serial # [id number] was successfully implanted on 04/12/15.   . Iron deficiency anemia     a. placed on iron and colase during 03/2016 admission   . CKD (chronic kidney disease)    Past Surgical History  Procedure Laterality Date  . Appendectomy      At age 8  . Total abdominal hysterectomy      Age 89  . Ep implantable device N/A 04/12/2015    Procedure: Pacemaker Implant;  Surgeon: Duke Salvia, MD;  Location: [facility] INVASIVE CV LAB;  Service: Cardiovascular;  Laterality: N/A;   Social History:  reports that she has never smoked. She does not have any smokeless tobacco  history on file. She reports that she does not drink alcohol or use illicit drugs.  No Known Allergies  Family History  Problem Relation Age of Onset  . Hypertension Mother   . Hypertension Father   . Heart attack Neg Hx   . Stroke Neg Hx      Prior to Admission medications   Medication Sig Start Date End Date Taking? Authorizing Provider  acetaminophen (TYLENOL) 500 MG tablet Take 500 mg by mouth every 6 (six) hours as needed for mild pain.   Yes Historical Provider, MD  amLODipine (NORVASC) 5 MG tablet Take 5 mg by mouth every morning.   Yes Historical Provider, MD  aspirin EC 81 MG tablet Take 81 mg by mouth daily.   Yes Historical Provider, MD  celecoxib (CELEBREX) 100 MG capsule Take 100 mg by mouth daily.   Yes Historical Provider, MD  Cholecalciferol (VITAMIN D3 SUPER STRENGTH) 2000 UNITS CAPS Take 2,000 Units by mouth daily.   Yes Historical Provider, MD  citalopram (CELEXA) 20 MG tablet Take 20 mg by mouth daily.    Yes Historical Provider, MD  docusate sodium (COLACE) 100 MG capsule Take 100 mg by mouth 2 (two) times daily.   Yes Historical Provider, MD  ferrous sulfate 325 (65 FE) MG tablet Take 325 mg  by mouth daily.   Yes Historical Provider, MD  furosemide (LASIX) 20 MG tablet Take 20 mg by mouth daily.   Yes Historical Provider, MD  HYDROcodone-acetaminophen (NORCO/VICODIN) 5-325 MG tablet Take 1 tablet by mouth 3 (three) times daily as needed for moderate pain.   Yes Historical Provider, MD  levothyroxine (SYNTHROID, LEVOTHROID) 88 MCG tablet Take 88 mcg by mouth daily before breakfast.   Yes Historical Provider, MD  LORazepam (ATIVAN) 0.5 MG tablet Take HALF tablet by mouth, at bedtime, as needed for sleep. Patient taking differently: Take 0.25 mg by mouth at bedtime as needed for sleep. Take HALF tablet by mouth, at bedtime, as needed for sleep. 05/03/15  Yes Janetta Hora, PA-C  metoprolol (LOPRESSOR) 50 MG tablet Take 1 tablet (50 mg total) by mouth 2 (two) times  daily. 04/14/15  Yes Janetta Hora, PA-C  Multiple Vitamin (MULTIVITAMIN WITH MINERALS) TABS tablet Take 1 tablet by mouth daily.   Yes Historical Provider, MD  omeprazole (PRILOSEC) 20 MG capsule Take 20 mg by mouth daily.   Yes Historical Provider, MD  valsartan (DIOVAN) 160 MG tablet Take 160 mg by mouth daily.   Yes Historical Provider, MD  vitamin B-12 (CYANOCOBALAMIN) 1000 MCG tablet Take 1,000 mcg by mouth daily.   Yes Historical Provider, MD  vitamin C (ASCORBIC ACID) 500 MG tablet Take 500 mg by mouth daily.   Yes Historical Provider, MD   Physical Exam: Filed Vitals:   06/26/15 0006 06/26/15 0030  BP: 150/66 151/90  Pulse: 67 64  Temp:    Resp: 22 25    BP 151/90 mmHg  Pulse 64  Temp(Src) 97.8 F (36.6 C) (Oral)  Resp 25  Ht  (1.575 m)  Wt 62.596 kg (138 lb)  BMI 25.23 kg/m2  SpO2 89%  General Appearance:    Alert, oriented, no distress, appears stated age  Head:    Normocephalic, atraumatic  Eyes:    PERRL, EOMI, sclera non-icteric        Nose:   Nares without drainage or epistaxis. Mucosa, turbinates normal  Throat:   Moist mucous membranes. Oropharynx without erythema or exudate.  Neck:   Supple. No carotid bruits.  No thyromegaly.  No lymphadenopathy.   Back:     No CVA tenderness, no spinal tenderness  Lungs:     Clear to auscultation bilaterally, without wheezes, rhonchi or rales  Chest wall:    No tenderness to palpitation  Heart:    Regular rate and rhythm without murmurs, gallops, rubs  Abdomen:     Soft, non-tender, nondistended, normal bowel sounds, no organomegaly  Genitalia:    deferred  Rectal:    deferred  Extremities:   No clubbing, cyanosis or edema.  Pulses:   2+ and symmetric all extremities  Skin:   Skin color, texture, turgor normal, no rashes or lesions  Lymph nodes:   Cervical, supraclavicular, and axillary nodes normal  Neurologic:   CNII-XII intact. Normal strength, sensation and reflexes      throughout    Labs on  Admission:  Basic Metabolic Panel:  Recent Labs Lab 06/25/15 2246  NA 141  K 4.1  CL 103  CO2 29  GLUCOSE 119*  BUN 23*  CREATININE 0.88  CALCIUM 9.3   Liver Function Tests: No results for input(s): AST, ALT, ALKPHOS, BILITOT, PROT, ALBUMIN in the last 168 hours. No results for input(s): LIPASE, AMYLASE in the last 168 hours. No results for input(s): AMMONIA in the last 168  hours. CBC:  Recent Labs Lab 06/25/15 2246  WBC 7.8  NEUTROABS 5.8  HGB 11.0*  HCT 34.7*  MCV 92.0  PLT 220   Cardiac Enzymes: No results for input(s): CKTOTAL, CKMB, CKMBINDEX, TROPONINI in the last 168 hours.  BNP (last 3 results) No results for input(s): PROBNP in the last 8760 hours. CBG: No results for input(s): GLUCAP in the last 168 hours.  Radiological Exams on Admission: Dg Chest 2 View  06/25/2015  CLINICAL DATA:  Cough and shortness of breath for several days. Nonsmoker. History of hypertension, CHF, COPD. EXAM: CHEST  2 VIEW COMPARISON:  04/13/2015 FINDINGS: Cardiac pacemaker. Shallow inspiration with atelectasis in the lung bases. Mild cardiac enlargement without significant vascular congestion. Small bilateral pleural effusions. No focal consolidation in the lungs. Calcified and tortuous aorta. No pneumothorax. IMPRESSION: Shallow inspiration with small bilateral pleural effusions and basilar atelectasis. Cardiac enlargement. Electronically Signed   By: Burman Nieves M.D.   On: 06/25/2015 22:23   Ct Angio Chest Pe W/cm &/or Wo Cm  06/26/2015  CLINICAL DATA:  Acute onset of decreased O2 saturation and difficulty breathing. Cough. Initial encounter. EXAM: CT ANGIOGRAPHY CHEST WITH CONTRAST TECHNIQUE: Multidetector CT imaging of the chest was performed using the standard protocol during bolus administration of intravenous contrast. Multiplanar CT image reconstructions and MIPs were obtained to evaluate the vascular anatomy. CONTRAST:  OMNIPAQUE IOHEXOL 350 MG/ML SOLN COMPARISON:   Chest radiograph performed 06/25/2015 FINDINGS: There is no evidence of pulmonary embolus. Dense airspace opacity is noted at the right lung base, and more mild left basilar atelectasis is noted. There is a mosaic pattern of parenchymal attenuation within the expanded portions of both lungs. Trace bilateral pleural fluid is noted. Note is made of mild bronchomalacia. There is no evidence of pneumothorax. No masses are identified; no abnormal focal contrast enhancement is seen. There is nonspecific prominence of the pulmonary outflow tract. A moderate hiatal hernia is noted. Diffuse coronary artery calcifications are seen. Dense calcification is noted at the mitral valve. Scattered calcification is noted along the thoracic aorta and proximal great vessels. No pericardial effusion is identified. No definite mediastinal lymphadenopathy is seen. No axillary lymphadenopathy is seen. The thyroid gland is diminutive and grossly unremarkable in appearance. A pacemaker is noted at the left chest wall, with leads ending at the right atrium and right ventricle. The visualized portions of the liver and spleen are unremarkable. The visualized portions of the pancreas, adrenal glands and kidneys are within normal limits. No acute osseous abnormalities are seen. Vacuum phenomenon is noted along the lower thoracic and upper lumbar spine. Review of the MIP images confirms the above findings. IMPRESSION: 1. No evidence of pulmonary embolus. 2. Dense airspace opacity at the right lung base,, concerning for pneumonia. More mild left basilar atelectasis also noted. 3. Mosaic pattern of parenchymal attenuation within the expanded portions of both lungs is nonspecific. Trace bilateral pleural fluid seen. 4. Mild bronchomalacia noted. 5. Nonspecific prominence of the pulmonary outflow tract. Diffuse coronary artery calcifications seen. Dense calcification at the mitral valve. 6. Moderate hiatal hernia noted. Electronically Signed   By:  Roanna Raider M.D.   On: 06/26/2015 02:31    EKG: Independently reviewed.  Assessment/Plan Principal Problem:   HCAP (healthcare-associated pneumonia) Active Problems:   Essential hypertension   Chronic diastolic CHF (congestive heart failure) (HCC)   Right lower lobe pneumonia   1. HCAP - 1. PNA pathway 2. Cefepime and vanc 3. Patient has no SIRS criteria at this  point, still meets inpatient criteria with increasing O2 requirement, and because of her Age (PSI/PORT score is 117). 4. Cultures pending 2. HTN - continue home meds 3. Chronic diastolic CHF - continue lasix for now, has absolutely NO SIRS criteria at this point so wont try and treat as sepsis for now    Code Status: DNR Family Communication: No family in room Disposition Plan: Admit to inpatient   Time spent: 70 min  GARDNER, JARED M. [facility] Pager [phone]  If 7AM-7PM, please contact the day team taking care of the patient [email or web address] Password [facility] 06/26/2015, 3:19 AM

## 2015-06-27 LAB — EXPECTORATED SPUTUM ASSESSMENT W REFEX TO RESP CULTURE

## 2015-06-27 LAB — BASIC METABOLIC PANEL
ANION GAP: 8 (ref 5–15)
BUN: 19 mg/dL (ref 6–20)
CHLORIDE: 102 mmol/L (ref 101–111)
CO2: 27 mmol/L (ref 22–32)
Calcium: 8.6 mg/dL — ABNORMAL LOW (ref 8.9–10.3)
Creatinine, Ser: 0.7 mg/dL (ref 0.44–1.00)
GFR calc Af Amer: >60 mL/min (ref >60)
GFR calc non Af Amer: >60 mL/min (ref >60)
GLUCOSE: 97 mg/dL (ref 65–99)
POTASSIUM: 3.6 mmol/L (ref 3.5–5.1)
Sodium: 137 mmol/L (ref 135–145)

## 2015-06-27 LAB — EXPECTORATED SPUTUM ASSESSMENT W GRAM STAIN, RFLX TO RESP C

## 2015-06-27 LAB — LACTIC ACID, PLASMA: LACTIC ACID, VENOUS: 1.4 mmol/L (ref 0.5–2.0)

## 2015-06-27 LAB — MAGNESIUM: Magnesium: 1.9 mg/dL (ref 1.7–2.4)

## 2015-06-27 NOTE — Progress Notes (Signed)
PROGRESS NOTE  Summer Graham ONG:[id number] DOB: 08-04-1915 DOA: 06/25/2015 PCP: Ginette Otto, MD  HPI/Recap of past 24 hours:  Feeling better, now oxygen requirement down to 5liter (on 2.5 at home)  Assessment/Plan: Principal Problem:   HCAP (healthcare-associated pneumonia) Active Problems:   Essential hypertension   Chronic diastolic CHF (congestive heart failure) (HCC)   Right lower lobe pneumonia  Acute on chronic respiratory failure:  o2 sats 83% on 4liter initial on presentation,  required nonrebreather. Better, now on 5liter ( baseline 2.5 liter at home)  HCAP: mrsa screening negative, clinically better, d/c vanc Continue cefepime, sputum culture pending, continue nebs, mucinex Swallow eval pending  HTN/diastolic chf: continue home meds including lasix, euvolemic, no edema    Code Status: DNR  Family Communication: patient   Disposition Plan: return to ALF early next week, sputum culture pending,  Continue wean oxygen   Consultants:  none  Procedures:  none  Antibiotics:  vanc from admission to 1/29  Cefepime from admission    Objective: BP 141/67 mmHg  Pulse 68  Temp(Src) 97.9 F (36.6 C) (Axillary)  Resp 18  Ht  (1.6 m)  Wt 62.778 kg (138 lb 6.4 oz)  BMI 24.52 kg/m2  SpO2 94%  Intake/Output Summary (Last 24 hours) at 06/27/15 1449 Last data filed at 06/27/15 0718  Gross per 24 hour  Intake    377 ml  Output    350 ml  Net     27 ml   Filed Weights   06/25/15 2151 06/26/15 0432  Weight: 62.596 kg (138 lb) 62.778 kg (138 lb 6.4 oz)    Exam:   General:  NAD  Cardiovascular: RRR  Respiratory: mild wheezing, good areation   Abdomen: Soft/ND/NT, positive BS  Musculoskeletal: No Edema  Neuro: aaox3  Data Reviewed: Basic Metabolic Panel:  Recent Labs Lab 06/25/15 2246 06/27/15 0413  NA 141 137  K 4.1 3.6  CL 103 102  CO2 29 27  GLUCOSE 119* 97  BUN 23* 19  CREATININE 0.88 0.70  CALCIUM 9.3 8.6*  MG   --  1.9   Liver Function Tests: No results for input(s): AST, ALT, ALKPHOS, BILITOT, PROT, ALBUMIN in the last 168 hours. No results for input(s): LIPASE, AMYLASE in the last 168 hours. No results for input(s): AMMONIA in the last 168 hours. CBC:  Recent Labs Lab 06/25/15 2246  WBC 7.8  NEUTROABS 5.8  HGB 11.0*  HCT 34.7*  MCV 92.0  PLT 220   Cardiac Enzymes:   No results for input(s): CKTOTAL, CKMB, CKMBINDEX, TROPONINI in the last 168 hours. BNP (last 3 results)  Recent Labs  04/08/15 1108 06/25/15 2246  BNP 951.7* 473.5*    ProBNP (last 3 results) No results for input(s): PROBNP in the last 8760 hours.  CBG: No results for input(s): GLUCAP in the last 168 hours.  Recent Results (from the past 240 hour(s))  Blood culture (routine x 2)     Status: None (Preliminary result)   Collection Time: 06/26/15  3:02 AM  Result Value Ref Range Status   Specimen Description   Final    BLOOD RIGHT ANTECUBITAL Performed at [facility]    Special Requests BOTTLES DRAWN AEROBIC AND ANAEROBIC 5CC  Final   Culture PENDING  Incomplete   Report Status PENDING  Incomplete  Blood culture (routine x 2)     Status: None (Preliminary result)   Collection Time: 06/26/15  4:44 AM  Result Value Ref Range Status  Specimen Description   Final    BLOOD RIGHT HAND Performed at [facility]    Special Requests BOTTLES DRAWN AEROBIC AND ANAEROBIC 5CC  Final   Culture PENDING  Incomplete   Report Status PENDING  Incomplete  MRSA PCR Screening     Status: None   Collection Time: 06/26/15  4:43 PM  Result Value Ref Range Status   MRSA by PCR NEGATIVE NEGATIVE Final    Comment:        The GeneXpert MRSA Assay (FDA approved for NASAL specimens only), is one component of a comprehensive MRSA colonization surveillance program. It is not intended to diagnose MRSA infection nor to guide or monitor treatment for MRSA infections.   Culture, sputum-assessment     Status:  None   Collection Time: 06/27/15 10:19 AM  Result Value Ref Range Status   Specimen Description SPUTUM  Final   Special Requests NONE  Final   Sputum evaluation   Final    THIS SPECIMEN IS ACCEPTABLE. RESPIRATORY CULTURE REPORT TO FOLLOW.   Report Status 06/27/2015 FINAL  Final     Studies: No results found.  Scheduled Meds: . amLODipine  5 mg Oral q morning - 10a  . antiseptic oral rinse  7 mL Mouth Rinse q12n4p  . aspirin EC  81 mg Oral Daily  . ceFEPime (MAXIPIME) IV  1 g Intravenous Q24H  . celecoxib  100 mg Oral Daily  . chlorhexidine  15 mL Mouth Rinse BID  . cholecalciferol  2,000 Units Oral Daily  . citalopram  20 mg Oral Daily  . docusate sodium  100 mg Oral BID  . enoxaparin (LOVENOX) injection  40 mg Subcutaneous Q24H  . ferrous sulfate  325 mg Oral Daily  . furosemide  20 mg Oral Daily  . guaiFENesin  600 mg Oral BID  . ipratropium-albuterol  3 mL Nebulization TID  . irbesartan  150 mg Oral Daily  . levothyroxine  88 mcg Oral QAC breakfast  . metoprolol  50 mg Oral BID  . pantoprazole  40 mg Oral Daily    Continuous Infusions:    Time spent:  Sedale Jenifer MD, PhD  [facility] Pager [phone]. If 7PM-7AM, please contact night-coverage at [email or web address], password [facility] 06/27/2015, 2:49 PM  LOS: 1 day

## 2015-06-27 NOTE — Progress Notes (Signed)
Utilization review completed.  

## 2015-06-28 DIAGNOSIS — J9621 Acute and chronic respiratory failure with hypoxia: Secondary | ICD-10-CM

## 2015-06-28 LAB — GLUCOSE, CAPILLARY
GLUCOSE-CAPILLARY: 109 mg/dL — AB (ref 65–99)
Glucose-Capillary: 120 mg/dL — ABNORMAL HIGH (ref 65–99)

## 2015-06-28 LAB — COMPREHENSIVE METABOLIC PANEL
ALBUMIN: 3.3 g/dL — AB (ref 3.5–5.0)
ALK PHOS: 62 U/L (ref 38–126)
ALT: 13 U/L — AB (ref 14–54)
AST: 22 U/L (ref 15–41)
Anion gap: 10 (ref 5–15)
BILIRUBIN TOTAL: 0.7 mg/dL (ref 0.3–1.2)
BUN: 16 mg/dL (ref 6–20)
CALCIUM: 8.9 mg/dL (ref 8.9–10.3)
CO2: 27 mmol/L (ref 22–32)
Chloride: 103 mmol/L (ref 101–111)
Creatinine, Ser: 0.73 mg/dL (ref 0.44–1.00)
GFR calc Af Amer: >60 mL/min (ref >60)
GFR calc non Af Amer: >60 mL/min (ref >60)
GLUCOSE: 99 mg/dL (ref 65–99)
Potassium: 3.8 mmol/L (ref 3.5–5.1)
SODIUM: 140 mmol/L (ref 135–145)
TOTAL PROTEIN: 5.7 g/dL — AB (ref 6.5–8.1)

## 2015-06-28 LAB — CBC
HEMATOCRIT: 31.3 % — AB (ref 36.0–46.0)
HEMOGLOBIN: 10.2 g/dL — AB (ref 12.0–15.0)
MCH: 29.1 pg (ref 26.0–34.0)
MCHC: 32.6 g/dL (ref 30.0–36.0)
MCV: 89.4 fL (ref 78.0–100.0)
Platelets: 187 10*3/uL (ref 150–400)
RBC: 3.5 MIL/uL — AB (ref 3.87–5.11)
RDW: 20.1 % — ABNORMAL HIGH (ref 11.5–15.5)
WBC: 6.3 10*3/uL (ref 4.0–10.5)

## 2015-06-28 LAB — LACTIC ACID, PLASMA: Lactic Acid, Venous: 1.3 mmol/L (ref 0.5–2.0)

## 2015-06-28 NOTE — Clinical Social Work Note (Signed)
Clinical Social Work Assessment  Patient Details  Name: Summer Graham MRN: [id number] Date of Birth: 09/19/1915  Date of referral:  06/28/15               Reason for consult:  Discharge Planning                Permission sought to share information with:    Permission granted to share information::     Name::     Ladonna Snide  Agency::     Relationship::  son  Contact Information:  [phone]  Housing/Transportation Living arrangements for the past 2 months:  [place] of Information:  Patient Patient Interpreter Needed:  None Criminal Activity/Legal Involvement Pertinent to Current Situation/Hospitalization:  No - Comment as needed Significant Relationships:  Adult Children Lives with:  Facility Resident Do you feel safe going back to the place where you live?  Yes Need for family participation in patient care:  No (Coment)  Care giving concerns:  Pt admitted from [facility] where pt has been for rehab.    Social Worker assessment / plan:  CSW received referral that pt admitted from [facility].  CSW met with pt at bedside. CSW introduced self and explained role. Pt discussed that she normally resides in ILF at [facility], but has been at rehab section at [facility] since she had her pacemaker placed. Pt agreeable to plan to return to [facility] rehab when medically ready.   CSW completed FL2 and sent clinical information to [facility]. CSW confirmed with [facility] that facility can accept pt back when ready.  CSW to continue to follow.   Employment status:  Retired Forensic scientist:  Medicare PT Recommendations:  Not assessed at this time [place] / Referral to community resources:  [place]  Patient/Family's Response to care:  Pt alert and oriented x 4. Pt pleasant and reports feeling better. Pt agreeable to returning to [facility] rehab when medically  ready.  Patient/Family's Understanding of and Emotional Response to Diagnosis, Current Treatment, and Prognosis:  Pt displayed understanding surrounding diagnosis and treatment plan and reports that she is feeling better.  Emotional Assessment Appearance:  Appears older than stated age Attitude/Demeanor/Rapport:  Other (appropriate) Affect (typically observed):  Appropriate, Pleasant Orientation:  Oriented to Self, Oriented to Place, Oriented to  Time, Oriented to Situation Alcohol / Substance use:  Not Applicable Psych involvement (Current and /or in the community):  No (Comment)  Discharge Needs  Concerns to be addressed:  Discharge Planning Concerns Readmission within the last 30 days:  No Current discharge risk:  None Barriers to Discharge:  Continued Medical Work up   Ladell Pier, [place] 06/28/2015, 10:35 AM [phone]

## 2015-06-28 NOTE — Progress Notes (Signed)
PROGRESS NOTE  Summer Graham [id number]:[id number] DOB: 1915/08/29 DOA: 06/25/2015 PCP: Ginette Otto, MD  HPI/Recap of past 24 hours:  Less cough, Still on oxygen requirement  5liter (on 2.5 at home), sputum culture pending  Assessment/Plan: Principal Problem:   HCAP (healthcare-associated pneumonia) Active Problems:   Essential hypertension   Chronic diastolic CHF (congestive heart failure) (HCC)   Right lower lobe pneumonia  Acute on chronic respiratory failure:  o2 sats 83% on 4liter initial on presentation,  required nonrebreather. Better, now on 5liter ( baseline 2.5 liter at home)  HCAP: mrsa screening negative, clinically better, d/c vanc Continue cefepime, sputum culture pending, continue nebs, mucinex Patient passed Swallow eval with regular diet  HTN/diastolic chf: continue home meds including lasix, euvolemic, no edema    Code Status: DNR  Family Communication: patient   Disposition Plan: return to ALF early next week, sputum culture pending,  Continue wean oxygen   Consultants:  none  Procedures:  none  Antibiotics:  vanc from admission to 1/29  Cefepime from admission    Objective: BP 150/80 mmHg  Pulse 65  Temp(Src) 97.5 F (36.4 C) (Oral)  Resp 18  Ht  (1.6 m)  Wt 62.778 kg (138 lb 6.4 oz)  BMI 24.52 kg/m2  SpO2 94%  Intake/Output Summary (Last 24 hours) at 06/28/15 0847 Last data filed at 06/28/15 0600  Gross per 24 hour  Intake    403 ml  Output    800 ml  Net   -397 ml   Filed Weights   06/25/15 2151 06/26/15 0432  Weight: 62.596 kg (138 lb) 62.778 kg (138 lb 6.4 oz)    Exam:   General:  NAD  Cardiovascular: RRR  Respiratory: mild wheezing, good areation   Abdomen: Soft/ND/NT, positive BS  Musculoskeletal: No Edema  Neuro: aaox3  Data Reviewed: Basic Metabolic Panel:  Recent Labs Lab 06/25/15 2246 06/27/15 0413 06/28/15 0512  NA 141 137 140  K 4.1 3.6 3.8  CL 103 102 103  CO2 GLUCOSE 119* 97 99  BUN 23* 19 16  CREATININE 0.88 0.70 0.73  CALCIUM 9.3 8.6* 8.9  MG  --  1.9  --    Liver Function Tests:  Recent Labs Lab 06/28/15 0512  AST 22  ALT 13*  ALKPHOS 62  BILITOT 0.7  PROT 5.7*  ALBUMIN 3.3*   No results for input(s): LIPASE, AMYLASE in the last 168 hours. No results for input(s): AMMONIA in the last 168 hours. CBC:  Recent Labs Lab 06/25/15 2246 06/28/15 0512  WBC 7.8 6.3  NEUTROABS 5.8  --   HGB 11.0* 10.2*  HCT 34.7* 31.3*  MCV 92.0 89.4  PLT 220 187   Cardiac Enzymes:   No results for input(s): CKTOTAL, CKMB, CKMBINDEX, TROPONINI in the last 168 hours. BNP (last 3 results)  Recent Labs  04/08/15 1108 06/25/15 2246  BNP 951.7* 473.5*    ProBNP (last 3 results) No results for input(s): PROBNP in the last 8760 hours.  CBG: No results for input(s): GLUCAP in the last 168 hours.  Recent Results (from the past 240 hour(s))  Blood culture (routine x 2)     Status: None (Preliminary result)   Collection Time: 06/26/15  3:02 AM  Result Value Ref Range Status   Specimen Description BLOOD RIGHT ANTECUBITAL  Final   Special Requests BOTTLES DRAWN AEROBIC AND ANAEROBIC 5CC  Final   Culture   Final    NO GROWTH 1 DAY  Performed at [facility]    Report Status PENDING  Incomplete  Blood culture (routine x 2)     Status: None (Preliminary result)   Collection Time: 06/26/15  4:44 AM  Result Value Ref Range Status   Specimen Description BLOOD RIGHT HAND  Final   Special Requests BOTTLES DRAWN AEROBIC AND ANAEROBIC 5CC  Final   Culture   Final    NO GROWTH 1 DAY Performed at [facility]    Report Status PENDING  Incomplete  MRSA PCR Screening     Status: None   Collection Time: 06/26/15  4:43 PM  Result Value Ref Range Status   MRSA by PCR NEGATIVE NEGATIVE Final    Comment:        The GeneXpert MRSA Assay (FDA approved for NASAL specimens only), is one component of a comprehensive MRSA  colonization surveillance program. It is not intended to diagnose MRSA infection nor to guide or monitor treatment for MRSA infections.   Culture, sputum-assessment     Status: None   Collection Time: 06/27/15 10:19 AM  Result Value Ref Range Status   Specimen Description SPUTUM  Final   Special Requests NONE  Final   Sputum evaluation   Final    THIS SPECIMEN IS ACCEPTABLE. RESPIRATORY CULTURE REPORT TO FOLLOW.   Report Status 06/27/2015 FINAL  Final     Studies: No results found.  Scheduled Meds: . amLODipine  5 mg Oral q morning - 10a  . antiseptic oral rinse  7 mL Mouth Rinse q12n4p  . aspirin EC  81 mg Oral Daily  . ceFEPime (MAXIPIME) IV  1 g Intravenous Q24H  . celecoxib  100 mg Oral Daily  . chlorhexidine  15 mL Mouth Rinse BID  . cholecalciferol  2,000 Units Oral Daily  . citalopram  20 mg Oral Daily  . docusate sodium  100 mg Oral BID  . enoxaparin (LOVENOX) injection  40 mg Subcutaneous Q24H  . ferrous sulfate  325 mg Oral Daily  . furosemide  20 mg Oral Daily  . guaiFENesin  600 mg Oral BID  . ipratropium-albuterol  3 mL Nebulization TID  . irbesartan  150 mg Oral Daily  . levothyroxine  88 mcg Oral QAC breakfast  . metoprolol  50 mg Oral BID  . pantoprazole  40 mg Oral Daily    Continuous Infusions:    Time spent:  Summer Corcoran MD, PhD  [facility] Pager [phone]. If 7PM-7AM, please contact night-coverage at [email or web address], password [facility] 06/28/2015, 8:47 AM  LOS: 2 days

## 2015-06-28 NOTE — NC FL2 (Signed)
[place] MEDICAID FL2 LEVEL OF CARE SCREENING TOOL     IDENTIFICATION  Patient Name: Summer Graham Birthdate: 1916-05-20 Sex: female Admission Date (Current Location): 06/25/2015  [facility] and [place] Number:  Producer, television/film/video and Address:  [facility],  [address] [place], [place] [address]      Provider Number: [id number]  Attending Physician Name and Address:  Albertine Grates, MD  Relative Name and Phone Number:  Anjelica Gorniak [phone])    Current Level of Care: Hospital Recommended Level of Care: Skilled Nursing Facility Prior Approval Number:    Date Approved/Denied:   PASRR Number: [id number]  Discharge Plan: SNF    Current Diagnoses: Patient Active Problem List   Diagnosis Date Noted  . HCAP (healthcare-associated pneumonia) 06/26/2015  . Right lower lobe pneumonia 06/26/2015  . Acute on chronic diastolic CHF (congestive heart failure), NYHA class 1 (HCC) 04/14/2015  . S/P placement of cardiac pacemaker   . Chronic diastolic CHF (congestive heart failure) (HCC)   . Hypertension   . CKD (chronic kidney disease)   . Near syncope   . Acute diastolic CHF (congestive heart failure) (HCC) 04/10/2015  . Essential hypertension 04/10/2015  . Acute renal failure (HCC) 04/10/2015  . Iron deficiency anemia 04/10/2015  . Bradycardia   . Heart block AV second degree 04/08/2015    Orientation RESPIRATION BLADDER Height & Weight     Self, Time, Situation, Place  O2 (6L-weaning in process) Continent (incontinent at times) Weight: 138 lb 6.4 oz (62.778 kg) Height:   (160 cm)  BEHAVIORAL SYMPTOMS/MOOD NEUROLOGICAL BOWEL NUTRITION STATUS   (n/a)  (NONE) Continent Diet (regular diet)  AMBULATORY STATUS COMMUNICATION OF NEEDS Skin   Limited Assist Verbally Normal                       Personal Care Assistance Level of Assistance  Bathing, Feeding, Dressing Bathing Assistance: Limited assistance Feeding assistance: Independent Dressing Assistance:  Limited assistance     Functional Limitations Info  Hearing   Hearing Info: Impaired      SPECIAL CARE FACTORS FREQUENCY  PT (By licensed PT)     PT Frequency: 5 x week              Contractures Contractures Info: Not present    Additional Factors Info  Code Status, Allergies Code Status Info: DNR code status Allergies Info: No Known Allergies           Current Medications (06/28/2015):  This is the current hospital active medication list Current Facility-Administered Medications  Medication Dose Route Frequency Provider Last Rate Last Dose  . acetaminophen (TYLENOL) tablet 500 mg  500 mg Oral Q6H PRN Hillary Bow, DO      . amLODipine (NORVASC) tablet 5 mg  5 mg Oral q morning - 10a Hillary Bow, DO   5 mg at 06/27/15 1145  . antiseptic oral rinse (CPC / CETYLPYRIDINIUM CHLORIDE 0.05%) solution 7 mL  7 mL Mouth Rinse q12n4p Hillary Bow, DO   7 mL at 06/27/15 1547  . aspirin EC tablet 81 mg  81 mg Oral Daily Hillary Bow, DO   81 mg at 06/27/15 1146  . ceFEPIme (MAXIPIME) 1 g in dextrose 5 % 50 mL IVPB  1 g Intravenous Q24H Leann T Poindexter, RPH   1 g at 06/28/15 0415  . celecoxib (CELEBREX) capsule 100 mg  100 mg Oral Daily Hillary Bow, DO   100 mg at  06/27/15 1146  . chlorhexidine (PERIDEX) 0.12 % solution 15 mL  15 mL Mouth Rinse BID Hillary Bow, DO   15 mL at 06/27/15 2203  . cholecalciferol (VITAMIN D) tablet 2,000 Units  2,000 Units Oral Daily Hillary Bow, DO   2,000 Units at 06/27/15 1146  . citalopram (CELEXA) tablet 20 mg  20 mg Oral Daily Hillary Bow, DO   20 mg at 06/27/15 1147  . docusate sodium (COLACE) capsule 100 mg  100 mg Oral BID Hillary Bow, DO   100 mg at 06/27/15 2203  . enoxaparin (LOVENOX) injection 40 mg  40 mg Subcutaneous Q24H Hillary Bow, DO   40 mg at 06/27/15 1147  . ferrous sulfate tablet 325 mg  325 mg Oral Daily Hillary Bow, DO   325 mg at 06/27/15 1147  . furosemide (LASIX) tablet 20 mg  20 mg  Oral Daily Hillary Bow, DO   20 mg at 06/27/15 1146  . guaiFENesin (MUCINEX) 12 hr tablet 600 mg  600 mg Oral BID Albertine Grates, MD   600 mg at 06/27/15 2203  . HYDROcodone-acetaminophen (NORCO/VICODIN) 5-325 MG per tablet 1 tablet  1 tablet Oral TID PRN Hillary Bow, DO   1 tablet at 06/27/15 2026  . ipratropium-albuterol (DUONEB) 0.5-2.5 (3) MG/3ML nebulizer solution 3 mL  3 mL Nebulization TID Albertine Grates, MD   3 mL at 06/28/15 0747  . irbesartan (AVAPRO) tablet 150 mg  150 mg Oral Daily Hillary Bow, DO   150 mg at 06/27/15 1148  . levothyroxine (SYNTHROID, LEVOTHROID) tablet 88 mcg  88 mcg Oral QAC breakfast Hillary Bow, DO   88 mcg at 06/28/15 1610  . LORazepam (ATIVAN) tablet 0.25 mg  0.25 mg Oral QHS PRN Hillary Bow, DO      . metoprolol tartrate (LOPRESSOR) tablet 50 mg  50 mg Oral BID Hillary Bow, DO   50 mg at 06/27/15 2203  . pantoprazole (PROTONIX) EC tablet 40 mg  40 mg Oral Daily Hillary Bow, DO   40 mg at 06/27/15 1145     Discharge Medications: Please see discharge summary for a list of discharge medications.  Relevant Imaging Results:  Relevant Lab Results:   Additional Information SS# [id number]  KIDD, Selena Lesser A, LCSW

## 2015-06-28 NOTE — Evaluation (Signed)
Clinical/Bedside Swallow Evaluation Patient Details  Name: Summer Graham MRN: [id number] Date of Birth: [birth year]  Today's Date: 06/28/2015 Time: SLP Start Time (ACUTE ONLY): 1150 SLP Stop Time (ACUTE ONLY): 1207 SLP Time Calculation (min) (ACUTE ONLY): 17 min  Past Medical History:  Past Medical History  Diagnosis Date  . Hypertension   . Chronic diastolic CHF (congestive heart failure) (HCC)     a. 03/2015: 2D ECHO: EF 60-65%, mild AS and AI, mildly dilated LA, mild diastolic dysfunction   . Parkinson's disease (HCC)   . COPD (chronic obstructive pulmonary disease) (HCC)     4L O2 baseline  . Heart block AV second degree     a. s/p Biotronik pulse generator: Serial # [id number] was successfully implanted on 04/12/15.   . S/P placement of cardiac pacemaker     a. Biotronik pulse generator: Serial # [id number] was successfully implanted on 04/12/15.   . Iron deficiency anemia     a. placed on iron and colase during 03/2016 admission   . CKD (chronic kidney disease)    Past Surgical History:  Past Surgical History  Procedure Laterality Date  . Appendectomy      At age 15  . Total abdominal hysterectomy      Age 21  . Ep implantable device N/A 04/12/2015    Procedure: Pacemaker Implant;  Surgeon: Duke Salvia, MD;  Location: [facility] INVASIVE CV LAB;  Service: Cardiovascular;  Laterality: N/A;   HPI:  82 y.o. female with no hx of dysphagia admitted with HCAP, essential HTN, acute on chronic resp failure.     Assessment / Plan / Recommendation Clinical Impression  Pt presents with normal oropharyngeal swallow with no indications of aspiration.  No CN deficits.  No overt s/s of dysphagia with sufficient mastication, brisk swallow response, no coughing associated with PO intake.  RR compatible with coordinated ventilatory/swallow sequence.  Alert and good historian.  Doubt aspiration as contributor to current PNA.  Recommend continuing current diet.  SLP will sign off.     Aspiration  Risk       Diet Recommendation    Continue regular diet, thin liquids. Medication Administration: Whole meds with liquid    Other  Recommendations Oral Care Recommendations: Oral care BID   Follow up Recommendations       Frequency and Duration            Prognosis        Swallow Study   General Date of Onset: 06/25/15 HPI: 82 y.o. female with no hx of dysphagia admitted with HCAP, essential HTN, acute on chronic resp failure.   Type of Study: Bedside Swallow Evaluation Previous Swallow Assessment: no Diet Prior to this Study: Regular;Thin liquids Temperature Spikes Noted: No Respiratory Status: Nasal cannula History of Recent Intubation: No Behavior/Cognition: Alert;Cooperative;Pleasant mood Oral Cavity Assessment: Within Functional Limits Oral Care Completed by SLP: No Oral Cavity - Dentition: Poor condition Vision: Functional for self-feeding Self-Feeding Abilities: Able to feed self Patient Positioning: Upright in bed Baseline Vocal Quality: Normal Volitional Cough: Strong Volitional Swallow: Able to elicit    Oral/Motor/Sensory Function Overall Oral Motor/Sensory Function: Within functional limits   Ice Chips Ice chips: Within functional limits Presentation: Spoon   Thin Liquid Thin Liquid: Within functional limits Presentation: Straw    Nectar Thick Nectar Thick Liquid: Not tested   Honey Thick Honey Thick Liquid: Not tested   Puree Puree: Within functional limits Presentation: Self Fed;Spoon   Solid   GO   Solid: Within  functional limits Presentation: Self Fed        Summer Graham Summer Graham 06/28/2015,12:14 PM

## 2015-06-29 LAB — BASIC METABOLIC PANEL
ANION GAP: 7 (ref 5–15)
BUN: 15 mg/dL (ref 6–20)
CO2: 29 mmol/L (ref 22–32)
Calcium: 8.5 mg/dL — ABNORMAL LOW (ref 8.9–10.3)
Chloride: 101 mmol/L (ref 101–111)
Creatinine, Ser: 0.72 mg/dL (ref 0.44–1.00)
GFR calc Af Amer: >60 mL/min (ref >60)
GLUCOSE: 99 mg/dL (ref 65–99)
POTASSIUM: 3.5 mmol/L (ref 3.5–5.1)
Sodium: 137 mmol/L (ref 135–145)

## 2015-06-29 LAB — TSH: TSH: 2.917 u[IU]/mL (ref 0.350–4.500)

## 2015-06-29 LAB — CBC
HEMATOCRIT: 32 % — AB (ref 36.0–46.0)
Hemoglobin: 10 g/dL — ABNORMAL LOW (ref 12.0–15.0)
MCH: 28.6 pg (ref 26.0–34.0)
MCHC: 31.3 g/dL (ref 30.0–36.0)
MCV: 91.4 fL (ref 78.0–100.0)
PLATELETS: 192 10*3/uL (ref 150–400)
RBC: 3.5 MIL/uL — ABNORMAL LOW (ref 3.87–5.11)
RDW: 19.7 % — ABNORMAL HIGH (ref 11.5–15.5)
WBC: 6 10*3/uL (ref 4.0–10.5)

## 2015-06-29 LAB — MAGNESIUM: MAGNESIUM: 1.8 mg/dL (ref 1.7–2.4)

## 2015-06-29 MED ORDER — ALBUTEROL SULFATE HFA 108 (90 BASE) MCG/ACT IN AERS: 2.0000 | INHALATION_SPRAY | Freq: Four times a day (QID) | RESPIRATORY_TRACT | Status: DC | PRN

## 2015-06-29 MED ORDER — GUAIFENESIN-DM 100-10 MG/5ML PO SYRP
5.0000 mL | ORAL_SOLUTION | ORAL | Status: DC | PRN
  Administered 2015-06-29: 5 mL via ORAL
  Filled 2015-06-29: qty 10

## 2015-06-29 MED ORDER — POTASSIUM CHLORIDE CRYS ER 20 MEQ PO TBCR
40.0000 meq | EXTENDED_RELEASE_TABLET | Freq: Once | ORAL | Status: AC
  Administered 2015-06-29: 40 meq via ORAL
  Filled 2015-06-29: qty 2

## 2015-06-29 MED ORDER — AMOXICILLIN-POT CLAVULANATE 875-125 MG PO TABS: 1.0000 | ORAL_TABLET | Freq: Two times a day (BID) | ORAL | Status: DC

## 2015-06-29 MED ORDER — IPRATROPIUM-ALBUTEROL 0.5-2.5 (3) MG/3ML IN SOLN
3.0000 mL | Freq: Two times a day (BID) | RESPIRATORY_TRACT | Status: DC
  Administered 2015-06-29 – 2015-07-01 (×4): 3 mL via RESPIRATORY_TRACT
  Filled 2015-06-29 (×4): qty 3

## 2015-06-29 MED ORDER — GUAIFENESIN ER 600 MG PO TB12: 600.0000 mg | ORAL_TABLET | Freq: Two times a day (BID) | ORAL | Status: DC

## 2015-06-29 MED ORDER — HYDROCODONE-ACETAMINOPHEN 5-325 MG PO TABS: 1.0000 | ORAL_TABLET | Freq: Three times a day (TID) | ORAL | Status: DC | PRN

## 2015-06-29 MED ORDER — FLORANEX PO PACK: 1.0000 g | PACK | Freq: Three times a day (TID) | ORAL | Status: DC

## 2015-06-29 MED ORDER — AMOXICILLIN-POT CLAVULANATE 875-125 MG PO TABS
1.0000 | ORAL_TABLET | Freq: Two times a day (BID) | ORAL | Status: DC
  Administered 2015-06-29 – 2015-07-01 (×5): 1 via ORAL
  Filled 2015-06-29 (×5): qty 1

## 2015-06-29 MED ORDER — ALBUTEROL SULFATE (2.5 MG/3ML) 0.083% IN NEBU: 2.5000 mg | INHALATION_SOLUTION | Freq: Four times a day (QID) | RESPIRATORY_TRACT | Status: DC | PRN

## 2015-06-29 MED ORDER — FUROSEMIDE 10 MG/ML IJ SOLN
40.0000 mg | Freq: Once | INTRAMUSCULAR | Status: AC
  Administered 2015-06-29: 40 mg via INTRAVENOUS
  Filled 2015-06-29: qty 4

## 2015-06-29 MED ORDER — FUROSEMIDE 40 MG PO TABS
40.0000 mg | ORAL_TABLET | Freq: Every day | ORAL | Status: DC
  Administered 2015-06-30 – 2015-07-01 (×2): 40 mg via ORAL
  Filled 2015-06-29 (×2): qty 1

## 2015-06-29 NOTE — Care Management Important Message (Signed)
Important Message  Patient Details  Name: Summer Graham MRN: [id number] Date of Birth: 1915/11/12   Medicare Important Message Given:  Yes    Haskell Flirt 06/29/2015, 9:09 AMImportant Message  Patient Details  Name: Summer Graham MRN: [id number] Date of Birth: Sep 22, 1915   Medicare Important Message Given:  Yes    Haskell Flirt 06/29/2015, 9:09 AM

## 2015-06-29 NOTE — Progress Notes (Signed)
ANTIBIOTIC CONSULT NOTE - follow up  Pharmacy Consult for Vancomycin & Cefepime Indication: pneumonia  No Known Allergies  Patient Measurements: Height:  (160 cm) Weight: 138 lb 6.4 oz (62.778 kg) IBW/kg (Calculated) : 52.4  Vital Signs: Temp: 97.5 F (36.4 C) (01/31 0559) Temp Source: Oral (01/31 0559) BP: 168/66 mmHg (01/31 0559) Pulse Rate: 68 (01/31 0559) Intake/Output from previous day: 01/30 0701 - 01/31 0700 In: 804 [P.O.:804] Out: 300 [Urine:300] Intake/Output from this shift:    Labs:  Recent Labs  06/27/15 0413 06/28/15 0512 06/29/15 0420  WBC  --  6.3 6.0  HGB  --  10.2* 10.0*  PLT  --  187 192  CREATININE 0.70 0.73 0.72   Estimated Creatinine Clearance: 33.3 mL/min (by C-G formula based on Cr of 0.72). No results for input(s): VANCOTROUGH, VANCOPEAK, VANCORANDOM, GENTTROUGH, GENTPEAK, GENTRANDOM, TOBRATROUGH, TOBRAPEAK, TOBRARND, AMIKACINPEAK, AMIKACINTROU, AMIKACIN in the last 72 hours.   Microbiology: Recent Results (from the past 720 hour(s))  Blood culture (routine x 2)     Status: None (Preliminary result)   Collection Time: 06/26/15  3:02 AM  Result Value Ref Range Status   Specimen Description BLOOD RIGHT ANTECUBITAL  Final   Special Requests BOTTLES DRAWN AEROBIC AND ANAEROBIC 5CC  Final   Culture   Final    NO GROWTH 2 DAYS Performed at [facility]    Report Status PENDING  Incomplete  Blood culture (routine x 2)     Status: None (Preliminary result)   Collection Time: 06/26/15  4:44 AM  Result Value Ref Range Status   Specimen Description BLOOD RIGHT HAND  Final   Special Requests BOTTLES DRAWN AEROBIC AND ANAEROBIC 5CC  Final   Culture   Final    NO GROWTH 2 DAYS Performed at [facility]    Report Status PENDING  Incomplete  MRSA PCR Screening     Status: None   Collection Time: 06/26/15  4:43 PM  Result Value Ref Range Status   MRSA by PCR NEGATIVE NEGATIVE Final    Comment:        The GeneXpert MRSA Assay  (FDA approved for NASAL specimens only), is one component of a comprehensive MRSA colonization surveillance program. It is not intended to diagnose MRSA infection nor to guide or monitor treatment for MRSA infections.   Culture, sputum-assessment     Status: None   Collection Time: 06/27/15 10:19 AM  Result Value Ref Range Status   Specimen Description SPUTUM  Final   Special Requests NONE  Final   Sputum evaluation   Final    THIS SPECIMEN IS ACCEPTABLE. RESPIRATORY CULTURE REPORT TO FOLLOW.   Report Status 06/27/2015 FINAL  Final    Medical History: Past Medical History  Diagnosis Date  . Hypertension   . Chronic diastolic CHF (congestive heart failure) (HCC)     a. 03/2015: 2D ECHO: EF 60-65%, mild AS and AI, mildly dilated LA, mild diastolic dysfunction   . Parkinson's disease (HCC)   . COPD (chronic obstructive pulmonary disease) (HCC)     4L O2 baseline  . Heart block AV second degree     a. s/p Biotronik pulse generator: Serial # [id number] was successfully implanted on 04/12/15.   . S/P placement of cardiac pacemaker     a. Biotronik pulse generator: Serial # [id number] was successfully implanted on 04/12/15.   . Iron deficiency anemia     a. placed on iron and colase during 03/2016 admission   . CKD (  chronic kidney disease)     Medications:  Scheduled:  . amLODipine  5 mg Oral q morning - 10a  . antiseptic oral rinse  7 mL Mouth Rinse q12n4p  . aspirin EC  81 mg Oral Daily  . ceFEPime (MAXIPIME) IV  1 g Intravenous Q24H  . celecoxib  100 mg Oral Daily  . chlorhexidine  15 mL Mouth Rinse BID  . cholecalciferol  2,000 Units Oral Daily  . citalopram  20 mg Oral Daily  . docusate sodium  100 mg Oral BID  . enoxaparin (LOVENOX) injection  40 mg Subcutaneous Q24H  . ferrous sulfate  325 mg Oral Daily  . furosemide  20 mg Oral Daily  . guaiFENesin  600 mg Oral BID  . ipratropium-albuterol  3 mL Nebulization TID  . irbesartan  150 mg Oral Daily  . levothyroxine  88  mcg Oral QAC breakfast  . metoprolol  50 mg Oral BID  . pantoprazole  40 mg Oral Daily   Infusions:    Assessment: 82 yr female with h/o CHF, COPD, CKD with c/o hypoxia. CTAngio concerning for pneumonia. Pharmacy consulted to dose Cefepime and Vancomycin for HCAP  1/28 >>Vanc >> 1/29 1/28 >>Cefepime >>    1/28 blood: ngtd 1/29 sputum: pending 1/28 MRSA PCR: negative  - Afeb, labs WNL  Goal of Therapy:  Vancomycin trough level 15-20 mcg/ml  Plan:  Day 4 Antibiotics - Continue Cefepime 1g q24 - Recommend change to PO antibiotics as able  Hessie Knows, PharmD, BCPS Pager [phone] 06/29/2015 9:23 AM

## 2015-06-29 NOTE — Progress Notes (Addendum)
PROGRESS NOTE  Summer Graham UYQ:[id number] DOB: 1916-05-09 DOA: 06/25/2015 PCP: Ginette Otto, MD  HPI/Recap of past 24 hours:  Less cough, Still on oxygen requirement  5liter (on 2.5 at home), drop to 85% when decreased to 4liter, sputum culture no growth, patient does not look septic, does not look fluids overload either.  Assessment/Plan: Principal Problem:   HCAP (healthcare-associated pneumonia) Active Problems:   Essential hypertension   Chronic diastolic CHF (congestive heart failure) (HCC)   Right lower lobe pneumonia  Acute on chronic respiratory failure:  o2 sats 83% on 4liter initial on presentation,  required nonrebreather. Better, now on 5liter ( baseline 2.5 liter at home) Sputum culture no growth, patient does not look septic, but she did get better on abx, will d/c iv abx and change to oral abx, increase lasix dose in case there is component of fluids overload, continue try to wean oxygen.  HCAP: mrsa screening negative, clinically better, d/c vanc Continue cefepime, sputum culture pending, continue nebs, mucinex Patient passed Swallow eval with regular diet Sputum culture no growth, no fever, no leukocytosis, will d/c iv abx, changed to oral augmentin.  HTN/diastolic chf: continue home meds including lasix, euvolemic, no edema, increase lasix dose on 1/31 due to difficulty weaning oxygen, monitor k/mag. Replace as needed.    Code Status: DNR  Family Communication: patient   Disposition Plan: return to ALF in 1-2 days   Consultants:  none  Procedures:  none  Antibiotics:  vanc from admission to 1/29  Cefepime from admission to 1/31  augmentin from 1/31   Objective: BP 168/66 mmHg  Pulse 70  Temp(Src) 97.5 F (36.4 C) (Oral)  Resp 16  Ht  (1.6 m)  Wt 62.778 kg (138 lb 6.4 oz)  BMI 24.52 kg/m2  SpO2 90%  Intake/Output Summary (Last 24 hours) at 06/29/15 1454 Last data filed at 06/29/15 1312  Gross per 24 hour  Intake    1044 ml  Output    300 ml  Net    744 ml   Filed Weights   06/25/15 2151 06/26/15 0432  Weight: 62.596 kg (138 lb) 62.778 kg (138 lb 6.4 oz)    Exam:   General:  NAD  Cardiovascular: RRR  Respiratory: minimal scattered crackles, good aeration, wheezing heard on presentation seems has resolved,  Abdomen: Soft/ND/NT, positive BS  Musculoskeletal: No Edema  Neuro: aaox3  Data Reviewed: Basic Metabolic Panel:  Recent Labs Lab 06/25/15 2246 06/27/15 0413 06/28/15 0512 06/29/15 0420  NA 141 137 140 137  K 4.1 3.6 3.8 3.5  CL 103 102 103 101  CO2 GLUCOSE 119* 97 99 99  BUN 23* CREATININE 0.88 0.70 0.73 0.72  CALCIUM 9.3 8.6* 8.9 8.5*  MG  --  1.9  --  1.8   Liver Function Tests:  Recent Labs Lab 06/28/15 0512  AST 22  ALT 13*  ALKPHOS 62  BILITOT 0.7  PROT 5.7*  ALBUMIN 3.3*   No results for input(s): LIPASE, AMYLASE in the last 168 hours. No results for input(s): AMMONIA in the last 168 hours. CBC:  Recent Labs Lab 06/25/15 2246 06/28/15 0512 06/29/15 0420  WBC 7.8 6.3 6.0  NEUTROABS 5.8  --   --   HGB 11.0* 10.2* 10.0*  HCT 34.7* 31.3* 32.0*  MCV 92.0 89.4 91.4  PLT 220 187 192   Cardiac Enzymes:   No results for input(s): CKTOTAL, CKMB, CKMBINDEX, TROPONINI in the last 168  hours. BNP (last 3 results)  Recent Labs  04/08/15 1108 06/25/15 2246  BNP 951.7* 473.5*    ProBNP (last 3 results) No results for input(s): PROBNP in the last 8760 hours.  CBG:  Recent Labs Lab 06/28/15 1252 06/28/15 1703  GLUCAP 120* 109*    Recent Results (from the past 240 hour(s))  Blood culture (routine x 2)     Status: None (Preliminary result)   Collection Time: 06/26/15  3:02 AM  Result Value Ref Range Status   Specimen Description BLOOD RIGHT ANTECUBITAL  Final   Special Requests BOTTLES DRAWN AEROBIC AND ANAEROBIC 5CC  Final   Culture   Final    NO GROWTH 3 DAYS Performed at [facility]    Report Status  PENDING  Incomplete  Blood culture (routine x 2)     Status: None (Preliminary result)   Collection Time: 06/26/15  4:44 AM  Result Value Ref Range Status   Specimen Description BLOOD RIGHT HAND  Final   Special Requests BOTTLES DRAWN AEROBIC AND ANAEROBIC 5CC  Final   Culture   Final    NO GROWTH 3 DAYS Performed at [facility]    Report Status PENDING  Incomplete  MRSA PCR Screening     Status: None   Collection Time: 06/26/15  4:43 PM  Result Value Ref Range Status   MRSA by PCR NEGATIVE NEGATIVE Final    Comment:        The GeneXpert MRSA Assay (FDA approved for NASAL specimens only), is one component of a comprehensive MRSA colonization surveillance program. It is not intended to diagnose MRSA infection nor to guide or monitor treatment for MRSA infections.   Culture, sputum-assessment     Status: None   Collection Time: 06/27/15 10:19 AM  Result Value Ref Range Status   Specimen Description SPUTUM  Final   Special Requests NONE  Final   Sputum evaluation   Final    THIS SPECIMEN IS ACCEPTABLE. RESPIRATORY CULTURE REPORT TO FOLLOW.   Report Status 06/27/2015 FINAL  Final  Culture, respiratory (NON-Expectorated)     Status: None (Preliminary result)   Collection Time: 06/27/15 10:19 AM  Result Value Ref Range Status   Specimen Description SPUTUM  Final   Special Requests NONE  Final   Gram Stain   Final    MODERATE WBC PRESENT,BOTH PMN AND MONONUCLEAR NO SQUAMOUS EPITHELIAL CELLS SEEN FEW GRAM POSITIVE COCCI IN PAIRS Performed at [facility]    Culture   Final    NORMAL OROPHARYNGEAL FLORA Performed at [facility]    Report Status PENDING  Incomplete     Studies: No results found.  Scheduled Meds: . amLODipine  5 mg Oral q morning - 10a  . amoxicillin-clavulanate  1 tablet Oral Q12H  . antiseptic oral rinse  7 mL Mouth Rinse q12n4p  . aspirin EC  81 mg Oral Daily  . celecoxib  100 mg Oral Daily  . chlorhexidine  15 mL Mouth  Rinse BID  . cholecalciferol  2,000 Units Oral Daily  . citalopram  20 mg Oral Daily  . docusate sodium  100 mg Oral BID  . enoxaparin (LOVENOX) injection  40 mg Subcutaneous Q24H  . ferrous sulfate  325 mg Oral Daily  . furosemide  40 mg Intravenous Once  . furosemide  20 mg Oral Daily  . guaiFENesin  600 mg Oral BID  . ipratropium-albuterol  3 mL Nebulization BID  . irbesartan  150 mg Oral  Daily  . levothyroxine  88 mcg Oral QAC breakfast  . metoprolol  50 mg Oral BID  . pantoprazole  40 mg Oral Daily  . potassium chloride  40 mEq Oral Once    Continuous Infusions:    Time spent:  Gryphon Vanderveen MD, PhD  [facility] Pager [phone]. If 7PM-7AM, please contact night-coverage at [email or web address], password [facility] 06/29/2015, 2:54 PM  LOS: 3 days

## 2015-06-29 NOTE — Care Management Note (Signed)
Case Management Note  Patient Details  Name: Adoria Kawamoto MRN: [id number] Date of Birth: 09-18-1915  Subjective/Objective:        82 yo admitted with HCAP            Action/Plan: Pt from SNF and to discharge back to SNF  Expected Discharge Date:                  Expected Discharge Plan:  Skilled Nursing Facility  In-House Referral:  Clinical Social Work  Discharge planning Services  CM Consult  Post Acute Care Choice:    Choice offered to:     DME Arranged:    DME Agency:     HH Arranged:    HH Agency:     Status of Service:  Completed, signed off  Medicare Important Message Given:  Yes Date Medicare IM Given:    Medicare IM give by:    Date Additional Medicare IM Given:    Additional Medicare Important Message give by:     If discussed at Long Length of Stay Meetings, dates discussed:    Additional Comments: Chart reviewed and no CM needs identified or communicated at this time. CM will continue to follow. Sandford Craze RN,BSN,NCM [phone] Bartholome Bill, RN 06/29/2015, 3:37 PM

## 2015-06-29 NOTE — Evaluation (Signed)
Physical Therapy Evaluation Patient Details Name: Summer Graham MRN: [id number] DOB: 04-24-1916 Today's Date: 06/29/2015   History of Present Illness  Summer Graham is a 82 y.o. female with h/o HTN, CHF, questionable COPD (patient says not, but she is on 4L O2 at baseline at [facility]). Patient presents to the ED from her NH. She has had increased SOB over the past 2 days. She is wheelchair bound at baseline.Was noted to be hypoxic on 4 L O2.  Clinical Impression  Patient presents at mod/max A level for OOB to chair transfer.  Not able to tolerate much weight on her legs due to debilitating arthritis.  Feel continued skilled PT in the acute setting will allow decreased burden of care for return to SNF at d/c.    Follow Up Recommendations Supervision/Assistance - 24 hour;SNF    Equipment Recommendations  None recommended by PT    Recommendations for Other Services       Precautions / Restrictions Precautions Precautions: Fall      Mobility  Bed Mobility Overal bed mobility: Needs Assistance Bed Mobility: Supine to Sit     Supine to sit: Min guard     General bed mobility comments: pulls up on rail  Transfers Overall transfer level: Needs assistance   Transfers: Squat Pivot Transfers     Squat pivot transfers: Mod assist     General transfer comment: up on her feet momentarily, but sat on corner of chair and needed assist to scoot rest of the way into chair.  Ambulation/Gait             General Gait Details: pt is nonambulatory  Information systems manager [employer] (Stroke Patients Only)       Balance Overall balance assessment: Needs assistance Sitting-balance support: No upper extremity supported;Feet supported Sitting balance-Leahy Scale: Good Sitting balance - Comments: able to sit EOB independent     Standing balance-Leahy Scale: Zero Standing balance comment: unable to stand                              Pertinent Vitals/Pain Pain Score: 0-No pain    Home Living Family/patient expects to be discharged to:: Skilled nursing facility                      Prior Function Level of Independence: Needs assistance   Gait / Transfers Assistance Needed: assist to get into w/c; propels with hands           Hand Dominance   Dominant Hand: Right    Extremity/Trunk Assessment               Lower Extremity Assessment: RLE deficits/detail;LLE deficits/detail RLE Deficits / Details: Limited knee flexion with severe arthritis, strength 3+/5 LLE Deficits / Details: Limited knee flexion with severe arthritis with audible crepitus, strength 3/5     Communication   Communication: HOH  Cognition Arousal/Alertness: Awake/alert Behavior During Therapy: WFL for tasks assessed/performed Overall Cognitive Status: Within Functional Limits for tasks assessed                      General Comments      Exercises        Assessment/Plan    PT Assessment Patient needs continued PT services  PT Diagnosis Generalized weakness   PT Problem List Decreased strength;Decreased knowledge of  use of DME;Decreased safety awareness;Decreased mobility;Decreased range of motion  PT Treatment Interventions Functional mobility training;Patient/family education;Therapeutic activities;Therapeutic exercise;DME instruction;Wheelchair mobility training   PT Goals (Current goals can be found in the Care Plan section) Acute Rehab PT Goals Patient Stated Goal: to return to [facility] when feeling better PT Goal Formulation: With patient Time For Goal Achievement: 07/13/15 Potential to Achieve Goals: Fair    Frequency Min 2X/week   Barriers to discharge        Co-evaluation               End of Session Equipment Utilized During Treatment: Gait belt Activity Tolerance: Patient tolerated treatment well Patient left: with call bell/phone within reach;in chair;with chair alarm  set           Time: 1036-1100 PT Time Calculation (min) (ACUTE ONLY): 24 min   Charges:   PT Evaluation $PT Eval Moderate Complexity: 1 Procedure PT Treatments $Therapeutic Activity: 8-22 mins   PT G CodesElray Mcgregor 07/02/2015, 11:55 AM  Sheran Lawless, PT [phone] 07/02/15

## 2015-06-30 ENCOUNTER — Inpatient Hospital Stay (HOSPITAL_COMMUNITY): Payer: Medicare Other

## 2015-06-30 DIAGNOSIS — J189 Pneumonia, unspecified organism: Secondary | ICD-10-CM

## 2015-06-30 LAB — BASIC METABOLIC PANEL
ANION GAP: 11 (ref 5–15)
BUN: 12 mg/dL (ref 6–20)
CALCIUM: 9.3 mg/dL (ref 8.9–10.3)
CO2: 31 mmol/L (ref 22–32)
CREATININE: 0.77 mg/dL (ref 0.44–1.00)
Chloride: 96 mmol/L — ABNORMAL LOW (ref 101–111)
Glucose, Bld: 110 mg/dL — ABNORMAL HIGH (ref 65–99)
Potassium: 4.1 mmol/L (ref 3.5–5.1)
SODIUM: 138 mmol/L (ref 135–145)

## 2015-06-30 LAB — CULTURE, RESPIRATORY W GRAM STAIN: Culture: NORMAL

## 2015-06-30 LAB — MAGNESIUM: MAGNESIUM: 1.7 mg/dL (ref 1.7–2.4)

## 2015-06-30 NOTE — Progress Notes (Signed)
Patient Demographics  Summer Graham, is a 82 y.o. female, DOB - Jul 20, 1915, ZOX:[id number]  Admit date - 06/25/2015   Admitting Physician Hillary Bow, DO  Outpatient Primary MD for the patient is Ginette Otto, MD  LOS - 4   Chief Complaint  Patient presents with  . Difficulty breathing; low o2 saturation        Admission HPI/Brief narrative: 82 year old female with history of hypertension, CHF, chronic respiratory failure on home oxygen, COPD, recent admission 11/16 for pacemaker insertion for second-degree heart block, presents with complaints of hypoxia, cough, CTA chest in ED negative for PE, but significant for pneumonia.  Subjective:   Summer Graham today has, No headache, No chest pain, No abdominal pain - No Nausea, reports occasional cough, nonproductive, baseline dyspnea.  Assessment & Plan    Principal Problem:   HCAP (healthcare-associated pneumonia) Active Problems:   Essential hypertension   Chronic diastolic CHF (congestive heart failure) (HCC)   Right lower lobe pneumonia  Acute on chronic hypoxic respiratory failure -  on baseline oxygen at facility 2.5-4 L - CTA chest negative for PE, significant for right lower lobe pneumonia - Remains hypoxic on 3 L nasal cannula, continue with oxygen as needed, continue with chest PT, Mucinex, flutter valve - Check chest x-ray for further evaluation as patient remains hypoxic  HCAP - CT chest significant for right lower lobe pneumonia, initially on IV vancomycin and Zosyn, currently IV antibiotic stopped, transitioned to by mouth Augmentin - Patient passed Swallow eval with regular diet - Sputum culture no growth, no fever, no leukocytosis,   HTN - Continue with Norvasc, irbesartan and metoprolol  Diastolic CHF - Appears to be euvolemic, continue with Lasix  History of AV block - Status post pacemaker insertion  11/16  Code Status: DNR  Family Communication: none at bedside  Disposition Plan: SNF when stable   Procedures  none   Consults   none   Medications  Scheduled Meds: . amLODipine  5 mg Oral q morning - 10a  . amoxicillin-clavulanate  1 tablet Oral Q12H  . antiseptic oral rinse  7 mL Mouth Rinse q12n4p  . aspirin EC  81 mg Oral Daily  . celecoxib  100 mg Oral Daily  . chlorhexidine  15 mL Mouth Rinse BID  . cholecalciferol  2,000 Units Oral Daily  . citalopram  20 mg Oral Daily  . docusate sodium  100 mg Oral BID  . enoxaparin (LOVENOX) injection  40 mg Subcutaneous Q24H  . ferrous sulfate  325 mg Oral Daily  . furosemide  40 mg Oral Daily  . guaiFENesin  600 mg Oral BID  . ipratropium-albuterol  3 mL Nebulization BID  . irbesartan  150 mg Oral Daily  . levothyroxine  88 mcg Oral QAC breakfast  . metoprolol  50 mg Oral BID  . pantoprazole  40 mg Oral Daily   Continuous Infusions:  PRN Meds:.acetaminophen, albuterol, guaiFENesin-dextromethorphan, HYDROcodone-acetaminophen, LORazepam  DVT Prophylaxis  Lovenox   Lab Results  Component Value Date   PLT 192 06/29/2015    Antibiotics    Anti-infectives    Start     Dose/Rate Route Frequency Ordered Stop   06/29/15 1530  amoxicillin-clavulanate (AUGMENTIN) 875-125 MG per tablet 1 tablet  1 tablet Oral Every 12 hours 06/29/15 1452     06/29/15 0000  amoxicillin-clavulanate (AUGMENTIN) 875-125 MG tablet     1 tablet Oral 2 times daily 06/29/15 1006     06/27/15 0400  vancomycin (VANCOCIN) IVPB 1000 mg/200 mL premix  Status:  Discontinued     1,000 mg 200 mL/hr over 60 Minutes Intravenous Every 24 hours 06/26/15 0328 06/27/15 1448   06/27/15 0400  ceFEPIme (MAXIPIME) 1 g in dextrose 5 % 50 mL IVPB  Status:  Discontinued     1 g 100 mL/hr over 30 Minutes Intravenous Every 24 hours 06/26/15 0328 06/29/15 1452   06/26/15 0330  ceFEPIme (MAXIPIME) 1 g in dextrose 5 % 50 mL IVPB     1 g 100 mL/hr over 30 Minutes  Intravenous STAT 06/26/15 0324 06/26/15 0548   06/26/15 0300  vancomycin (VANCOCIN) IVPB 1000 mg/200 mL premix     1,000 mg 200 mL/hr over 60 Minutes Intravenous  Once 06/26/15 0256 06/26/15 0435          Objective:   Filed Vitals:   06/30/15 0415 06/30/15 0858 06/30/15 0905 06/30/15 1444  BP: 149/75   126/54  Pulse: 71   72  Temp: 98.6 F (37 C)   97.5 F (36.4 C)  TempSrc: Oral   Oral  Resp: 16   16  Height:      Weight:      SpO2: 94% 95% 95% 93%    Wt Readings from Last 3 Encounters:  06/26/15 62.778 kg (138 lb 6.4 oz)  05/03/15 62.596 kg (138 lb)  04/14/15 66.9 kg (147 lb 7.8 oz)     Intake/Output Summary (Last 24 hours) at 06/30/15 1531 Last data filed at 06/30/15 1233  Gross per 24 hour  Intake    480 ml  Output      0 ml  Net    480 ml     Physical Exam  Awake Alert, Oriented X 3,  [place].AT,PERRAL Supple Neck,No JVD, No cervical lymphadenopathy appriciated.  No use of accessory muscle, Good air movement bilaterally, no wheezing RRR,No Gallops,Rubs or new Murmurs, No Parasternal Heave +ve B.Sounds, Abd Soft, No tenderness, No rebound - guarding or rigidity. No Cyanosis, Clubbing or edema, No new Rash or bruise     Data Review   Micro Results Recent Results (from the past 240 hour(s))  Blood culture (routine x 2)     Status: None (Preliminary result)   Collection Time: 06/26/15  3:02 AM  Result Value Ref Range Status   Specimen Description BLOOD RIGHT ANTECUBITAL  Final   Special Requests BOTTLES DRAWN AEROBIC AND ANAEROBIC 5CC  Final   Culture   Final    NO GROWTH 4 DAYS Performed at [facility]    Report Status PENDING  Incomplete  Blood culture (routine x 2)     Status: None (Preliminary result)   Collection Time: 06/26/15  4:44 AM  Result Value Ref Range Status   Specimen Description BLOOD RIGHT HAND  Final   Special Requests BOTTLES DRAWN AEROBIC AND ANAEROBIC 5CC  Final   Culture   Final    NO GROWTH 4 DAYS Performed at [facility]    Report Status PENDING  Incomplete  MRSA PCR Screening     Status: None   Collection Time: 06/26/15  4:43 PM  Result Value Ref Range Status   MRSA by PCR NEGATIVE NEGATIVE Final    Comment:  The GeneXpert MRSA Assay (FDA approved for NASAL specimens only), is one component of a comprehensive MRSA colonization surveillance program. It is not intended to diagnose MRSA infection nor to guide or monitor treatment for MRSA infections.   Culture, sputum-assessment     Status: None   Collection Time: 06/27/15 10:19 AM  Result Value Ref Range Status   Specimen Description SPUTUM  Final   Special Requests NONE  Final   Sputum evaluation   Final    THIS SPECIMEN IS ACCEPTABLE. RESPIRATORY CULTURE REPORT TO FOLLOW.   Report Status 06/27/2015 FINAL  Final  Culture, respiratory (NON-Expectorated)     Status: None   Collection Time: 06/27/15 10:19 AM  Result Value Ref Range Status   Specimen Description SPUTUM  Final   Special Requests NONE  Final   Gram Stain   Final    MODERATE WBC PRESENT,BOTH PMN AND MONONUCLEAR NO SQUAMOUS EPITHELIAL CELLS SEEN FEW GRAM POSITIVE COCCI IN PAIRS Performed at [facility]    Culture   Final    NORMAL OROPHARYNGEAL FLORA Performed at [facility]    Report Status 06/30/2015 FINAL  Final    Radiology Reports Dg Chest 2 View  06/25/2015  CLINICAL DATA:  Cough and shortness of breath for several days. Nonsmoker. History of hypertension, CHF, COPD. EXAM: CHEST  2 VIEW COMPARISON:  04/13/2015 FINDINGS: Cardiac pacemaker. Shallow inspiration with atelectasis in the lung bases. Mild cardiac enlargement without significant vascular congestion. Small bilateral pleural effusions. No focal consolidation in the lungs. Calcified and tortuous aorta. No pneumothorax. IMPRESSION: Shallow inspiration with small bilateral pleural effusions and basilar atelectasis. Cardiac enlargement. Electronically Signed   By: Burman Nieves  M.D.   On: 06/25/2015 22:23   Ct Angio Chest Pe W/cm &/or Wo Cm  06/26/2015  CLINICAL DATA:  Acute onset of decreased O2 saturation and difficulty breathing. Cough. Initial encounter. EXAM: CT ANGIOGRAPHY CHEST WITH CONTRAST TECHNIQUE: Multidetector CT imaging of the chest was performed using the standard protocol during bolus administration of intravenous contrast. Multiplanar CT image reconstructions and MIPs were obtained to evaluate the vascular anatomy. CONTRAST:  OMNIPAQUE IOHEXOL 350 MG/ML SOLN COMPARISON:  Chest radiograph performed 06/25/2015 FINDINGS: There is no evidence of pulmonary embolus. Dense airspace opacity is noted at the right lung base, and more mild left basilar atelectasis is noted. There is a mosaic pattern of parenchymal attenuation within the expanded portions of both lungs. Trace bilateral pleural fluid is noted. Note is made of mild bronchomalacia. There is no evidence of pneumothorax. No masses are identified; no abnormal focal contrast enhancement is seen. There is nonspecific prominence of the pulmonary outflow tract. A moderate hiatal hernia is noted. Diffuse coronary artery calcifications are seen. Dense calcification is noted at the mitral valve. Scattered calcification is noted along the thoracic aorta and proximal great vessels. No pericardial effusion is identified. No definite mediastinal lymphadenopathy is seen. No axillary lymphadenopathy is seen. The thyroid gland is diminutive and grossly unremarkable in appearance. A pacemaker is noted at the left chest wall, with leads ending at the right atrium and right ventricle. The visualized portions of the liver and spleen are unremarkable. The visualized portions of the pancreas, adrenal glands and kidneys are within normal limits. No acute osseous abnormalities are seen. Vacuum phenomenon is noted along the lower thoracic and upper lumbar spine. Review of the MIP images confirms the above findings. IMPRESSION: 1. No  evidence of pulmonary embolus. 2. Dense airspace opacity at the right lung base,,  concerning for pneumonia. More mild left basilar atelectasis also noted. 3. Mosaic pattern of parenchymal attenuation within the expanded portions of both lungs is nonspecific. Trace bilateral pleural fluid seen. 4. Mild bronchomalacia noted. 5. Nonspecific prominence of the pulmonary outflow tract. Diffuse coronary artery calcifications seen. Dense calcification at the mitral valve. 6. Moderate hiatal hernia noted. Electronically Signed   By: Roanna Raider M.D.   On: 06/26/2015 02:31     CBC  Recent Labs Lab 06/25/15 2246 06/28/15 0512 06/29/15 0420  WBC 7.8 6.3 6.0  HGB 11.0* 10.2* 10.0*  HCT 34.7* 31.3* 32.0*  PLT 220 187 192  MCV 92.0 89.4 91.4  MCH 29.2 29.1 28.6  MCHC 31.7 32.6 31.3  RDW 20.2* 20.1* 19.7*  LYMPHSABS 1.3  --   --   MONOABS 0.4  --   --   EOSABS 0.3  --   --   BASOSABS 0.0  --   --     Chemistries   Recent Labs Lab 06/25/15 2246 06/27/15 0413 06/28/15 0512 06/29/15 0420 06/30/15 0427  NA 141 137 140 137 138  K 4.1 3.6 3.8 3.5 4.1  CL 103 102 103 101 96*  CO2 29 27 27 29 31   GLUCOSE 119* 97 99 99 110*  BUN 23* 19 16 15 12   CREATININE 0.88 0.70 0.73 0.72 0.77  CALCIUM 9.3 8.6* 8.9 8.5* 9.3  MG  --  1.9  --  1.8 1.7  AST  --   --  22  --   --   ALT  --   --  13*  --   --   ALKPHOS  --   --  62  --   --   BILITOT  --   --  0.7  --   --    ------------------------------------------------------------------------------------------------------------------ estimated creatinine clearance is 33.3 mL/min (by C-G formula based on Cr of 0.77). ------------------------------------------------------------------------------------------------------------------ No results for input(s): HGBA1C in the last 72 hours. ------------------------------------------------------------------------------------------------------------------ No results for input(s): CHOL, HDL, LDLCALC, TRIG,  CHOLHDL, LDLDIRECT in the last 72 hours. ------------------------------------------------------------------------------------------------------------------  Recent Labs  06/29/15 0615  TSH 2.917   ------------------------------------------------------------------------------------------------------------------ No results for input(s): VITAMINB12, FOLATE, FERRITIN, TIBC, IRON, RETICCTPCT in the last 72 hours.  Coagulation profile No results for input(s): INR, PROTIME in the last 168 hours.  No results for input(s): DDIMER in the last 72 hours.  Cardiac Enzymes No results for input(s): CKMB, TROPONINI, MYOGLOBIN in the last 168 hours.  Invalid input(s): CK ------------------------------------------------------------------------------------------------------------------ Invalid input(s): POCBNP     Time Spent in minutes   25 minutes   Briea Mcenery M.D on 06/30/2015 at 3:31 PM  Between 7am to 7pm - Pager - [phone]  After 7pm go to [email or web address] - password [facility]  [facility]   Office  [phone]

## 2015-07-01 LAB — CULTURE, BLOOD (ROUTINE X 2)
CULTURE: NO GROWTH
Culture: NO GROWTH

## 2015-07-01 MED ORDER — GUAIFENESIN ER 600 MG PO TB12: 600.0000 mg | ORAL_TABLET | Freq: Two times a day (BID) | ORAL | Status: DC

## 2015-07-01 MED ORDER — LORAZEPAM 0.5 MG PO TABS: ORAL_TABLET | ORAL | Status: DC

## 2015-07-01 MED ORDER — ALBUTEROL SULFATE (2.5 MG/3ML) 0.083% IN NEBU: 2.5000 mg | INHALATION_SOLUTION | Freq: Four times a day (QID) | RESPIRATORY_TRACT | Status: AC | PRN

## 2015-07-01 MED ORDER — IPRATROPIUM-ALBUTEROL 0.5-2.5 (3) MG/3ML IN SOLN: 3.0000 mL | Freq: Two times a day (BID) | RESPIRATORY_TRACT | Status: AC

## 2015-07-01 MED ORDER — HYDROCODONE-ACETAMINOPHEN 5-325 MG PO TABS: 1.0000 | ORAL_TABLET | Freq: Three times a day (TID) | ORAL | Status: DC | PRN

## 2015-07-01 MED ORDER — AMOXICILLIN-POT CLAVULANATE 875-125 MG PO TABS: 1.0000 | ORAL_TABLET | Freq: Two times a day (BID) | ORAL | Status: AC

## 2015-07-01 NOTE — Discharge Instructions (Signed)
Follow with Primary MD Ginette Otto, MD at facility - Continue with oxygen at 4 L nasal cannula Get CBC, CMP, 2 view Chest X ray checked  by Primary MD next visit.    Activity: As tolerated with Full fall precautions use walker/cane & assistance as needed   Disposition SNF   Diet: Heart Healthy , with feeding assistance and aspiration precautions.  For Heart failure patients - Check your Weight same time everyday, if you gain over 2 pounds, or you develop in leg swelling, experience more shortness of breath or chest pain, call your Primary MD immediately. Follow Cardiac Low Salt Diet and 1.5 lit/day fluid restriction.   On your next visit with your primary care physician please Get Medicines reviewed and adjusted.   Please request your Prim.MD to go over all Hospital Tests and Procedure/Radiological results at the follow up, please get all Hospital records sent to your Prim MD by signing hospital release before you go home.   If you experience worsening of your admission symptoms, develop shortness of breath, life threatening emergency, suicidal or homicidal thoughts you must seek medical attention immediately by calling 911 or calling your MD immediately  if symptoms less severe.  You Must read complete instructions/literature along with all the possible adverse reactions/side effects for all the Medicines you take and that have been prescribed to you. Take any new Medicines after you have completely understood and accpet all the possible adverse reactions/side effects.   Do not drive, operating heavy machinery, perform activities at heights, swimming or participation in water activities or provide baby sitting services if your were admitted for syncope or siezures until you have seen by Primary MD or a Neurologist and advised to do so again.  Do not drive when taking Pain medications.    Do not take more than prescribed Pain, Sleep and Anxiety Medications  Special  Instructions: If you have smoked or chewed Tobacco  in the last 2 yrs please stop smoking, stop any regular Alcohol  and or any Recreational drug use.  Wear Seat belts while driving.   Please note  You were cared for by a hospitalist during your hospital stay. If you have any questions about your discharge medications or the care you received while you were in the hospital after you are discharged, you can call the unit and asked to speak with the hospitalist on call if the hospitalist that took care of you is not available. Once you are discharged, your primary care physician will handle any further medical issues. Please note that NO REFILLS for any discharge medications will be authorized once you are discharged, as it is imperative that you return to your primary care physician (or establish a relationship with a primary care physician if you do not have one) for your aftercare needs so that they can reassess your need for medications and monitor your lab values.

## 2015-07-01 NOTE — Progress Notes (Signed)
Report given to Fulton Mole at [facility].

## 2015-07-01 NOTE — Progress Notes (Signed)
Pt for discharge to [facility] and [facility].  CSW facilitated pt discharge needs including contacting facility, faxing pt discharge information via [employer], discussing with pt at bedside and notifying pt son, Archivist via telephone, providing RN phone number to call report, and arranging ambulance transport for pt to [facility] and [facility].  No further social work needs identified at this time.   CSW signing off.   Loletta Specter, MSW, LCSW Clinical Social Work [phone]

## 2015-07-01 NOTE — Progress Notes (Signed)
Grand daughter Amy called this Clinical research associate, updated with patient's condition.

## 2015-07-01 NOTE — Progress Notes (Signed)
Patient d/c to [facility] SNF via nonemergency ambulance,denies SOB, stable on d/c.

## 2015-07-01 NOTE — Discharge Summary (Signed)
Summer Graham, is a 82 y.o. female  DOB July 24, 1915  MRN [id number].  Admission date:  06/25/2015  Admitting Physician  Hillary Bow, DO  Discharge Date:  07/01/2015   Primary MD  Ginette Otto, MD  Recommendations for primary care physician for things to follow:  - Please check 2 view chest x-ray in 2 weeks - Please encourage to continue using flutter valve and incentive spirometry, which patient will be discharged with - Patient to continue on 4 L nasal cannula   Admission Diagnosis  Cough [R05] Hypoxia [R09.02] HCAP (healthcare-associated pneumonia) [J18.9]   Discharge Diagnosis  Cough [R05] Hypoxia [R09.02] HCAP (healthcare-associated pneumonia) [J18.9]    Principal Problem:   HCAP (healthcare-associated pneumonia) Active Problems:   Essential hypertension   Chronic diastolic CHF (congestive heart failure) (HCC)   Right lower lobe pneumonia      Past Medical History  Diagnosis Date  . Hypertension   . Chronic diastolic CHF (congestive heart failure) (HCC)     a. 03/2015: 2D ECHO: EF 60-65%, mild AS and AI, mildly dilated LA, mild diastolic dysfunction   . Parkinson's disease (HCC)   . COPD (chronic obstructive pulmonary disease) (HCC)     4L O2 baseline  . Heart block AV second degree     a. s/p Biotronik pulse generator: Serial # [id number] was successfully implanted on 04/12/15.   . S/P placement of cardiac pacemaker     a. Biotronik pulse generator: Serial # [id number] was successfully implanted on 04/12/15.   . Iron deficiency anemia     a. placed on iron and colase during 03/2016 admission   . CKD (chronic kidney disease)     Past Surgical History  Procedure Laterality Date  . Appendectomy      At age 38  . Total abdominal hysterectomy      Age 89  . Ep implantable device N/A 04/12/2015    Procedure: Pacemaker Implant;  Surgeon: Duke Salvia, MD;  Location: [facility]  INVASIVE CV LAB;  Service: Cardiovascular;  Laterality: N/A;       History of present illness and  Hospital Course:     Kindly see H&P for history of present illness and admission details, please review complete Labs, Consult reports and Test reports for all details in brief  HPI  from the history and physical done on the day of admission 06/26/2015 HPI: Summer Graham is a 82 y.o. female with h/o HTN, CHF, questionable COPD (patient says not, but she is on 4L O2 at baseline at [facility]). Patient presents to the ED from her NH. She has had increased SOB over the past 2 days. She is wheelchair bound at baseline. Has been coughing up some clear mucus. 4L O2 at baseline, when measured today she was hypoxic to 83%. She has been requiring 5-6L O2 to maintain sats of 90% or so here in the ED. Got albuterol and solumedrol en route due to wheezing in lower lobes. Work up in ED demonstrates RLL PNA on the CT scan.  Hospital Course  82 year old female with history of hypertension, CHF, chronic respiratory failure on home oxygen, COPD, recent admission 11/16 for pacemaker insertion for second-degree heart block, presents with complaints of hypoxia, cough, CTA chest in ED negative for PE, but significant for pneumonia.  Acute on chronic hypoxic respiratory failure - on baseline oxygen at facility 2.5-4 L. to continue 4 L nasal cannula on discharge - CTA chest negative for PE, significant for right lower lobe pneumonia - continue with chest PT, Mucinex, flutter valve, incentive spirometer as x-ray 06/30/2015 showing bibasilar atelectasis, repeat chest x-ray in 2 weeks. - Patient with chronic elevation of right hemidiaphragm most likely contributing to her chronic respiratory failure.   HCAP - CT chest significant for right lower lobe pneumonia, initially on IV vancomycin and Zosyn,  transitioned to by mouth Augmentin, to finish another 2 days as an outpatient - Patient passed Swallow eval with regular  diet - Sputum culture no growth, no fever, no leukocytosis,  HTN - Continue with Norvasc, irbesartan and metoprolol  Diastolic CHF - Appears to be euvolemic, continue with Lasix  History of AV block - Status post pacemaker insertion 11/16   Discharge Condition:  stable   Follow UP  Follow-up Information    Follow up with [facility].   Specialty:  Skilled Nursing Facility   Contact information:   [address] [place] [address] [phone]        Discharge Instructions  and  Discharge Medications    Discharge Instructions    Diet - low sodium heart healthy    Complete by:  As directed      Diet - low sodium heart healthy    Complete by:  As directed      Discharge instructions    Complete by:  As directed   Follow with Primary MD Ginette Otto, MDat SNF   Get CBC, CMP, 2 view Chest X ray checked  by Primary MD next visit.    Activity: As tolerated with Full fall precautions use walker/cane & assistance as needed   Disposition SNF   Diet: Heart Healthy ** , with feeding assistance and aspiration precautions.  For Heart failure patients - Check your Weight same time everyday, if you gain over 2 pounds, or you develop in leg swelling, experience more shortness of breath or chest pain, call your Primary MD immediately. Follow Cardiac Low Salt Diet and 1.5 lit/day fluid restriction.   On your next visit with your primary care physician please Get Medicines reviewed and adjusted.   Please request your Prim.MD to go over all Hospital Tests and Procedure/Radiological results at the follow up, please get all Hospital records sent to your Prim MD by signing hospital release before you go home.   If you experience worsening of your admission symptoms, develop shortness of breath, life threatening emergency, suicidal or homicidal thoughts you must seek medical attention immediately by calling 911 or calling your MD immediately  if  symptoms less severe.  You Must read complete instructions/literature along with all the possible adverse reactions/side effects for all the Medicines you take and that have been prescribed to you. Take any new Medicines after you have completely understood and accpet all the possible adverse reactions/side effects.   Do not drive, operating heavy machinery, perform activities at heights, swimming or participation in water activities or provide baby sitting services if your were admitted for syncope or siezures until you have seen by Primary MD or a Neurologist and advised  to do so again.  Do not drive when taking Pain medications.    Do not take more than prescribed Pain, Sleep and Anxiety Medications  Special Instructions: If you have smoked or chewed Tobacco  in the last 2 yrs please stop smoking, stop any regular Alcohol  and or any Recreational drug use.  Wear Seat belts while driving.   Please note  You were cared for by a hospitalist during your hospital stay. If you have any questions about your discharge medications or the care you received while you were in the hospital after you are discharged, you can call the unit and asked to speak with the hospitalist on call if the hospitalist that took care of you is not available. Once you are discharged, your primary care physician will handle any further medical issues. Please note that NO REFILLS for any discharge medications will be authorized once you are discharged, as it is imperative that you return to your primary care physician (or establish a relationship with a primary care physician if you do not have one) for your aftercare needs so that they can reassess your need for medications and monitor your lab values.     Increase activity slowly    Complete by:  As directed      Increase activity slowly    Complete by:  As directed             Medication List    TAKE these medications        acetaminophen 500 MG tablet    Commonly known as:  TYLENOL  Take 500 mg by mouth every 6 (six) hours as needed for mild pain.     albuterol 108 (90 Base) MCG/ACT inhaler  Commonly known as:  PROVENTIL HFA;VENTOLIN HFA  Inhale 2 puffs into the lungs every 6 (six) hours as needed for wheezing or shortness of breath.     albuterol (2.5 MG/3ML) 0.083% nebulizer solution  Commonly known as:  PROVENTIL  Take 3 mLs (2.5 mg total) by nebulization every 6 (six) hours as needed for wheezing or shortness of breath.     amLODipine 5 MG tablet  Commonly known as:  NORVASC  Take 5 mg by mouth every morning.     amoxicillin-clavulanate 875-125 MG tablet  Commonly known as:  AUGMENTIN  Take 1 tablet by mouth 2 (two) times daily. Please take for 2 days then stop     aspirin EC 81 MG tablet  Take 81 mg by mouth daily.     celecoxib 100 MG capsule  Commonly known as:  CELEBREX  Take 100 mg by mouth daily.     citalopram 20 MG tablet  Commonly known as:  CELEXA  Take 20 mg by mouth daily.     docusate sodium 100 MG capsule  Commonly known as:  COLACE  Take 100 mg by mouth 2 (two) times daily.     ferrous sulfate 325 (65 FE) MG tablet  Take 325 mg by mouth daily.     furosemide 20 MG tablet  Commonly known as:  LASIX  Take 20 mg by mouth daily.     guaiFENesin 600 MG 12 hr tablet  Commonly known as:  MUCINEX  Take 1 tablet (600 mg total) by mouth 2 (two) times daily. Please take for 2 weeks then stop     HYDROcodone-acetaminophen 5-325 MG tablet  Commonly known as:  NORCO/VICODIN  Take 1 tablet by mouth 3 (three) times daily as needed for moderate  pain.     ipratropium-albuterol 0.5-2.5 (3) MG/3ML Soln  Commonly known as:  DUONEB  Take 3 mLs by nebulization 2 (two) times daily.     lactobacillus Pack  Take 1 packet (1 g total) by mouth 3 (three) times daily with meals.     levothyroxine 88 MCG tablet  Commonly known as:  SYNTHROID, LEVOTHROID  Take 88 mcg by mouth daily before breakfast.     LORazepam 0.5  MG tablet  Commonly known as:  ATIVAN  Take HALF tablet by mouth, at bedtime, as needed for sleep.     metoprolol 50 MG tablet  Commonly known as:  LOPRESSOR  Take 1 tablet (50 mg total) by mouth 2 (two) times daily.     multivitamin with minerals Tabs tablet  Take 1 tablet by mouth daily.     omeprazole 20 MG capsule  Commonly known as:  PRILOSEC  Take 20 mg by mouth daily.     valsartan 160 MG tablet  Commonly known as:  DIOVAN  Take 160 mg by mouth daily.     vitamin B-12 1000 MCG tablet  Commonly known as:  CYANOCOBALAMIN  Take 1,000 mcg by mouth daily.     vitamin C 500 MG tablet  Commonly known as:  ASCORBIC ACID  Take 500 mg by mouth daily.     VITAMIN D3 SUPER STRENGTH 2000 units Caps  Generic drug:  Cholecalciferol  Take 2,000 Units by mouth daily.          Diet and Activity recommendation: See Discharge Instructions above   Consults obtained -  none   Major procedures and Radiology Reports - PLEASE review detailed and final reports for all details, in brief -      Dg Chest 2 View  06/25/2015  CLINICAL DATA:  Cough and shortness of breath for several days. Nonsmoker. History of hypertension, CHF, COPD. EXAM: CHEST  2 VIEW COMPARISON:  04/13/2015 FINDINGS: Cardiac pacemaker. Shallow inspiration with atelectasis in the lung bases. Mild cardiac enlargement without significant vascular congestion. Small bilateral pleural effusions. No focal consolidation in the lungs. Calcified and tortuous aorta. No pneumothorax. IMPRESSION: Shallow inspiration with small bilateral pleural effusions and basilar atelectasis. Cardiac enlargement. Electronically Signed   By: Burman Nieves M.D.   On: 06/25/2015 22:23   Ct Angio Chest Pe W/cm &/or Wo Cm  06/26/2015  CLINICAL DATA:  Acute onset of decreased O2 saturation and difficulty breathing. Cough. Initial encounter. EXAM: CT ANGIOGRAPHY CHEST WITH CONTRAST TECHNIQUE: Multidetector CT imaging of the chest was performed  using the standard protocol during bolus administration of intravenous contrast. Multiplanar CT image reconstructions and MIPs were obtained to evaluate the vascular anatomy. CONTRAST:  OMNIPAQUE IOHEXOL 350 MG/ML SOLN COMPARISON:  Chest radiograph performed 06/25/2015 FINDINGS: There is no evidence of pulmonary embolus. Dense airspace opacity is noted at the right lung base, and more mild left basilar atelectasis is noted. There is a mosaic pattern of parenchymal attenuation within the expanded portions of both lungs. Trace bilateral pleural fluid is noted. Note is made of mild bronchomalacia. There is no evidence of pneumothorax. No masses are identified; no abnormal focal contrast enhancement is seen. There is nonspecific prominence of the pulmonary outflow tract. A moderate hiatal hernia is noted. Diffuse coronary artery calcifications are seen. Dense calcification is noted at the mitral valve. Scattered calcification is noted along the thoracic aorta and proximal great vessels. No pericardial effusion is identified. No definite mediastinal lymphadenopathy is seen. No axillary lymphadenopathy is  seen. The thyroid gland is diminutive and grossly unremarkable in appearance. A pacemaker is noted at the left chest wall, with leads ending at the right atrium and right ventricle. The visualized portions of the liver and spleen are unremarkable. The visualized portions of the pancreas, adrenal glands and kidneys are within normal limits. No acute osseous abnormalities are seen. Vacuum phenomenon is noted along the lower thoracic and upper lumbar spine. Review of the MIP images confirms the above findings. IMPRESSION: 1. No evidence of pulmonary embolus. 2. Dense airspace opacity at the right lung base,, concerning for pneumonia. More mild left basilar atelectasis also noted. 3. Mosaic pattern of parenchymal attenuation within the expanded portions of both lungs is nonspecific. Trace bilateral pleural fluid seen.  4. Mild bronchomalacia noted. 5. Nonspecific prominence of the pulmonary outflow tract. Diffuse coronary artery calcifications seen. Dense calcification at the mitral valve. 6. Moderate hiatal hernia noted. Electronically Signed   By: Roanna Raider M.D.   On: 06/26/2015 02:31   Dg Chest Port 1 View  06/30/2015  CLINICAL DATA:  82 year old female with history of hypoxia today. Intermittent productive cough. EXAM: PORTABLE CHEST 1 VIEW COMPARISON:  Chest x-ray 06/25/2015. FINDINGS: Lung volumes remain low. There are bibasilar opacities which may reflect areas of atelectasis and/or consolidation. No definite pleural effusions. No evidence of pulmonary edema. Heart size is mildly enlarged. Upper mediastinal contours are within normal limits. Atherosclerotic calcifications in the thoracic aorta. Left-sided pacemaker device in position with lead tips projecting over the expected location of the right atrium and right ventricular apex. Chronic elevation of the right hemidiaphragm is unchanged. IMPRESSION: 1. Low lung volumes with bibasilar opacities which may reflect areas of atelectasis and/or consolidation. 2. Atherosclerosis. 3. Chronic elevation of the right hemidiaphragm. Electronically Signed   By: Trudie Reed M.D.   On: 06/30/2015 16:07    Micro Results     Recent Results (from the past 240 hour(s))  Blood culture (routine x 2)     Status: None (Preliminary result)   Collection Time: 06/26/15  3:02 AM  Result Value Ref Range Status   Specimen Description BLOOD RIGHT ANTECUBITAL  Final   Special Requests BOTTLES DRAWN AEROBIC AND ANAEROBIC 5CC  Final   Culture   Final    NO GROWTH 4 DAYS Performed at [facility]    Report Status PENDING  Incomplete  Blood culture (routine x 2)     Status: None (Preliminary result)   Collection Time: 06/26/15  4:44 AM  Result Value Ref Range Status   Specimen Description BLOOD RIGHT HAND  Final   Special Requests BOTTLES DRAWN AEROBIC AND  ANAEROBIC 5CC  Final   Culture   Final    NO GROWTH 4 DAYS Performed at [facility]    Report Status PENDING  Incomplete  MRSA PCR Screening     Status: None   Collection Time: 06/26/15  4:43 PM  Result Value Ref Range Status   MRSA by PCR NEGATIVE NEGATIVE Final    Comment:        The GeneXpert MRSA Assay (FDA approved for NASAL specimens only), is one component of a comprehensive MRSA colonization surveillance program. It is not intended to diagnose MRSA infection nor to guide or monitor treatment for MRSA infections.   Culture, sputum-assessment     Status: None   Collection Time: 06/27/15 10:19 AM  Result Value Ref Range Status   Specimen Description SPUTUM  Final   Special Requests NONE  Final  Sputum evaluation   Final    THIS SPECIMEN IS ACCEPTABLE. RESPIRATORY CULTURE REPORT TO FOLLOW.   Report Status 06/27/2015 FINAL  Final  Culture, respiratory (NON-Expectorated)     Status: None   Collection Time: 06/27/15 10:19 AM  Result Value Ref Range Status   Specimen Description SPUTUM  Final   Special Requests NONE  Final   Gram Stain   Final    MODERATE WBC PRESENT,BOTH PMN AND MONONUCLEAR NO SQUAMOUS EPITHELIAL CELLS SEEN FEW GRAM POSITIVE COCCI IN PAIRS Performed at [facility]    Culture   Final    NORMAL OROPHARYNGEAL FLORA Performed at [facility]    Report Status 06/30/2015 FINAL  Final       Today   Subjective:   Summer Graham today hasNo headache, No chest pain, No abdominal pain - No Nausea, reports occasional cough, nonproductive, baseline dyspnea.   Objective:   Blood pressure 126/59, pulse 76, temperature 97.5 F (36.4 C), temperature source Oral, resp. rate 16, height 5\' 3"  (1.6 m), weight 62.778 kg (138 lb 6.4 oz), SpO2 90 %.   Intake/Output Summary (Last 24 hours) at 07/01/15 1035 Last data filed at 06/30/15 1233  Gross per 24 hour  Intake    120 ml  Output      0 ml  Net    120 ml    Exam Awake Alert,  Oriented x 3, No new F.N deficits, Normal affect [place].AT,PERRAL Supple Neck,No JVD, No cervical lymphadenopathy appriciated.  Symmetrical Chest wall movement, Good air movement bilaterally, no wheezing, no use of accessory muscle RRR,No Gallops,Rubs or new Murmurs, No Parasternal Heave +ve B.Sounds, Abd Soft, Non tender, No organomegaly appriciated, No rebound -guarding or rigidity. No Cyanosis, Clubbing or edema, No new Rash or bruise  Data Review   CBC w Diff: Lab Results  Component Value Date   WBC 6.0 06/29/2015   HGB 10.0* 06/29/2015   HCT 32.0* 06/29/2015   PLT 192 06/29/2015   LYMPHOPCT 17 06/25/2015   MONOPCT 5 06/25/2015   EOSPCT 4 06/25/2015   BASOPCT 0 06/25/2015    CMP: Lab Results  Component Value Date   NA 138 06/30/2015   K 4.1 06/30/2015   CL 96* 06/30/2015   CO2 31 06/30/2015   BUN 12 06/30/2015   CREATININE 0.77 06/30/2015   PROT 5.7* 06/28/2015   ALBUMIN 3.3* 06/28/2015   BILITOT 0.7 06/28/2015   ALKPHOS 62 06/28/2015   AST 22 06/28/2015   ALT 13* 06/28/2015  .   Total Time in preparing paper work, data evaluation and todays exam - 35 minutes  Summer Graham M.D on 07/01/2015 at 10:35 AM  [facility]   Office  [phone]

## 2015-07-08 ENCOUNTER — Emergency Department (HOSPITAL_COMMUNITY): Payer: Medicare Other

## 2015-07-08 ENCOUNTER — Inpatient Hospital Stay (HOSPITAL_COMMUNITY)
Admission: EM | Admit: 2015-07-08 | Discharge: 2015-07-12 | DRG: 292 | Disposition: A | Discharge status: Skilled Nursing Facility | Payer: Medicare Other | Attending: Internal Medicine | Admitting: Internal Medicine

## 2015-07-08 ENCOUNTER — Encounter (HOSPITAL_COMMUNITY): Payer: Self-pay

## 2015-07-08 DIAGNOSIS — I5033 Acute on chronic diastolic (congestive) heart failure: Principal | ICD-10-CM | POA: Diagnosis present

## 2015-07-08 DIAGNOSIS — Z8249 Family history of ischemic heart disease and other diseases of the circulatory system: Secondary | ICD-10-CM

## 2015-07-08 DIAGNOSIS — J449 Chronic obstructive pulmonary disease, unspecified: Secondary | ICD-10-CM | POA: Diagnosis present

## 2015-07-08 DIAGNOSIS — I509 Heart failure, unspecified: Secondary | ICD-10-CM

## 2015-07-08 DIAGNOSIS — Z7982 Long term (current) use of aspirin: Secondary | ICD-10-CM

## 2015-07-08 DIAGNOSIS — G20 Parkinson's disease: Secondary | ICD-10-CM | POA: Diagnosis present

## 2015-07-08 DIAGNOSIS — D509 Iron deficiency anemia, unspecified: Secondary | ICD-10-CM | POA: Diagnosis not present

## 2015-07-08 DIAGNOSIS — I10 Essential (primary) hypertension: Secondary | ICD-10-CM | POA: Diagnosis present

## 2015-07-08 DIAGNOSIS — Z8701 Personal history of pneumonia (recurrent): Secondary | ICD-10-CM

## 2015-07-08 DIAGNOSIS — I441 Atrioventricular block, second degree: Secondary | ICD-10-CM | POA: Diagnosis present

## 2015-07-08 DIAGNOSIS — Z90710 Acquired absence of both cervix and uterus: Secondary | ICD-10-CM

## 2015-07-08 DIAGNOSIS — N179 Acute kidney failure, unspecified: Secondary | ICD-10-CM | POA: Diagnosis not present

## 2015-07-08 DIAGNOSIS — Z950 Presence of cardiac pacemaker: Secondary | ICD-10-CM | POA: Diagnosis present

## 2015-07-08 DIAGNOSIS — J9610 Chronic respiratory failure, unspecified whether with hypoxia or hypercapnia: Secondary | ICD-10-CM | POA: Diagnosis present

## 2015-07-08 DIAGNOSIS — E039 Hypothyroidism, unspecified: Secondary | ICD-10-CM | POA: Diagnosis not present

## 2015-07-08 DIAGNOSIS — R0902 Hypoxemia: Secondary | ICD-10-CM

## 2015-07-08 DIAGNOSIS — Z79899 Other long term (current) drug therapy: Secondary | ICD-10-CM

## 2015-07-08 DIAGNOSIS — T501X5A Adverse effect of loop [high-ceiling] diuretics, initial encounter: Secondary | ICD-10-CM | POA: Diagnosis not present

## 2015-07-08 DIAGNOSIS — R0602 Shortness of breath: Secondary | ICD-10-CM | POA: Diagnosis not present

## 2015-07-08 DIAGNOSIS — Z66 Do not resuscitate: Secondary | ICD-10-CM | POA: Diagnosis present

## 2015-07-08 DIAGNOSIS — Z9981 Dependence on supplemental oxygen: Secondary | ICD-10-CM

## 2015-07-08 DIAGNOSIS — H9190 Unspecified hearing loss, unspecified ear: Secondary | ICD-10-CM | POA: Diagnosis present

## 2015-07-08 DIAGNOSIS — I110 Hypertensive heart disease with heart failure: Secondary | ICD-10-CM | POA: Diagnosis present

## 2015-07-08 HISTORY — DX: Pneumonia, unspecified organism: J18.9

## 2015-07-08 LAB — COMPREHENSIVE METABOLIC PANEL
ALBUMIN: 3.6 g/dL (ref 3.5–5.0)
ALT: 14 U/L (ref 14–54)
AST: 22 U/L (ref 15–41)
Alkaline Phosphatase: 76 U/L (ref 38–126)
Anion gap: 9 (ref 5–15)
BUN: 22 mg/dL — AB (ref 6–20)
CALCIUM: 9 mg/dL (ref 8.9–10.3)
CO2: 29 mmol/L (ref 22–32)
CREATININE: 0.79 mg/dL (ref 0.44–1.00)
Chloride: 103 mmol/L (ref 101–111)
GFR calc Af Amer: >60 mL/min (ref >60)
GFR calc non Af Amer: >60 mL/min (ref >60)
GLUCOSE: 109 mg/dL — AB (ref 65–99)
Potassium: 4.5 mmol/L (ref 3.5–5.1)
SODIUM: 141 mmol/L (ref 135–145)
TOTAL PROTEIN: 6.3 g/dL — AB (ref 6.5–8.1)
Total Bilirubin: 0.6 mg/dL (ref 0.3–1.2)

## 2015-07-08 LAB — CBC WITH DIFFERENTIAL/PLATELET
BASOS ABS: 0 10*3/uL (ref 0.0–0.1)
BASOS PCT: 0 %
EOS ABS: 0.3 10*3/uL (ref 0.0–0.7)
EOS PCT: 5 %
HCT: 34.9 % — ABNORMAL LOW (ref 36.0–46.0)
Hemoglobin: 10.6 g/dL — ABNORMAL LOW (ref 12.0–15.0)
LYMPHS PCT: 15 %
Lymphs Abs: 1 10*3/uL (ref 0.7–4.0)
MCH: 28.9 pg (ref 26.0–34.0)
MCHC: 30.4 g/dL (ref 30.0–36.0)
MCV: 95.1 fL (ref 78.0–100.0)
MONO ABS: 0.4 10*3/uL (ref 0.1–1.0)
Monocytes Relative: 6 %
Neutro Abs: 4.7 10*3/uL (ref 1.7–7.7)
Neutrophils Relative %: 74 %
PLATELETS: 240 10*3/uL (ref 150–400)
RBC: 3.67 MIL/uL — AB (ref 3.87–5.11)
RDW: 18.7 % — AB (ref 11.5–15.5)
WBC: 6.4 10*3/uL (ref 4.0–10.5)

## 2015-07-08 LAB — I-STAT TROPONIN, ED: Troponin i, poc: 0 ng/mL (ref 0.00–0.08)

## 2015-07-08 LAB — BRAIN NATRIURETIC PEPTIDE: B Natriuretic Peptide: 327.8 pg/mL — ABNORMAL HIGH (ref 0.0–100.0)

## 2015-07-08 MED ORDER — IRBESARTAN 150 MG PO TABS
150.0000 mg | ORAL_TABLET | Freq: Every day | ORAL | Status: DC
  Administered 2015-07-09: 150 mg via ORAL
  Filled 2015-07-08: qty 1

## 2015-07-08 MED ORDER — LORAZEPAM 0.5 MG PO TABS
0.2500 mg | ORAL_TABLET | Freq: Every day | ORAL | Status: DC
  Administered 2015-07-08 – 2015-07-11 (×4): 0.25 mg via ORAL
  Filled 2015-07-08 (×4): qty 1

## 2015-07-08 MED ORDER — CITALOPRAM HYDROBROMIDE 20 MG PO TABS
20.0000 mg | ORAL_TABLET | Freq: Every day | ORAL | Status: DC
  Administered 2015-07-09 – 2015-07-12 (×4): 20 mg via ORAL
  Filled 2015-07-08 (×4): qty 1

## 2015-07-08 MED ORDER — FERROUS SULFATE 325 (65 FE) MG PO TABS
325.0000 mg | ORAL_TABLET | Freq: Every day | ORAL | Status: DC
  Administered 2015-07-09 – 2015-07-12 (×4): 325 mg via ORAL
  Filled 2015-07-08 (×4): qty 1

## 2015-07-08 MED ORDER — FUROSEMIDE 10 MG/ML IJ SOLN
40.0000 mg | Freq: Once | INTRAMUSCULAR | Status: AC
  Administered 2015-07-08: 40 mg via INTRAVENOUS
  Filled 2015-07-08: qty 4

## 2015-07-08 MED ORDER — VITAMIN D 1000 UNITS PO TABS
2000.0000 [IU] | ORAL_TABLET | Freq: Every day | ORAL | Status: DC
  Administered 2015-07-09 – 2015-07-12 (×5): 2000 [IU] via ORAL
  Filled 2015-07-08 (×5): qty 2

## 2015-07-08 MED ORDER — ONDANSETRON HCL 4 MG/2ML IJ SOLN: 4.0000 mg | Freq: Four times a day (QID) | INTRAMUSCULAR | Status: DC | PRN

## 2015-07-08 MED ORDER — ADULT MULTIVITAMIN W/MINERALS CH
1.0000 | ORAL_TABLET | Freq: Every day | ORAL | Status: DC
  Administered 2015-07-09 – 2015-07-12 (×4): 1 via ORAL
  Filled 2015-07-08 (×4): qty 1

## 2015-07-08 MED ORDER — SODIUM CHLORIDE 0.9% FLUSH
3.0000 mL | INTRAVENOUS | Status: DC | PRN
  Administered 2015-07-09: 3 mL via INTRAVENOUS
  Filled 2015-07-08: qty 3

## 2015-07-08 MED ORDER — IPRATROPIUM-ALBUTEROL 0.5-2.5 (3) MG/3ML IN SOLN
3.0000 mL | Freq: Two times a day (BID) | RESPIRATORY_TRACT | Status: DC
  Administered 2015-07-08 – 2015-07-12 (×8): 3 mL via RESPIRATORY_TRACT
  Filled 2015-07-08 (×8): qty 3

## 2015-07-08 MED ORDER — FUROSEMIDE 10 MG/ML IJ SOLN
40.0000 mg | Freq: Three times a day (TID) | INTRAMUSCULAR | Status: DC
  Administered 2015-07-08 – 2015-07-09 (×3): 40 mg via INTRAVENOUS
  Filled 2015-07-08 (×3): qty 4

## 2015-07-08 MED ORDER — ACETAMINOPHEN 325 MG PO TABS
650.0000 mg | ORAL_TABLET | ORAL | Status: DC | PRN
  Administered 2015-07-12: 650 mg via ORAL
  Filled 2015-07-08: qty 2

## 2015-07-08 MED ORDER — ASPIRIN EC 81 MG PO TBEC
81.0000 mg | DELAYED_RELEASE_TABLET | Freq: Every day | ORAL | Status: DC
  Administered 2015-07-09 – 2015-07-12 (×4): 81 mg via ORAL
  Filled 2015-07-08 (×4): qty 1

## 2015-07-08 MED ORDER — CELECOXIB 100 MG PO CAPS
100.0000 mg | ORAL_CAPSULE | Freq: Every day | ORAL | Status: DC
  Administered 2015-07-09 – 2015-07-10 (×2): 100 mg via ORAL
  Filled 2015-07-08 (×2): qty 1

## 2015-07-08 MED ORDER — SODIUM CHLORIDE 0.9% FLUSH
3.0000 mL | Freq: Two times a day (BID) | INTRAVENOUS | Status: DC
  Administered 2015-07-08 – 2015-07-12 (×9): 3 mL via INTRAVENOUS

## 2015-07-08 MED ORDER — VITAMIN C 500 MG PO TABS
500.0000 mg | ORAL_TABLET | Freq: Every day | ORAL | Status: DC
  Administered 2015-07-09 – 2015-07-12 (×4): 500 mg via ORAL
  Filled 2015-07-08 (×4): qty 1

## 2015-07-08 MED ORDER — GUAIFENESIN ER 600 MG PO TB12
600.0000 mg | ORAL_TABLET | Freq: Two times a day (BID) | ORAL | Status: DC
  Administered 2015-07-08 – 2015-07-12 (×8): 600 mg via ORAL
  Filled 2015-07-08 (×8): qty 1

## 2015-07-08 MED ORDER — LEVOTHYROXINE SODIUM 88 MCG PO TABS
88.0000 ug | ORAL_TABLET | Freq: Every day | ORAL | Status: DC
Start: 2015-07-09 — End: 2015-07-12
  Administered 2015-07-09 – 2015-07-12 (×4): 88 ug via ORAL
  Filled 2015-07-08 (×4): qty 1

## 2015-07-08 MED ORDER — ENOXAPARIN SODIUM 40 MG/0.4ML ~~LOC~~ SOLN
40.0000 mg | SUBCUTANEOUS | Status: DC
  Administered 2015-07-08 – 2015-07-09 (×2): 40 mg via SUBCUTANEOUS
  Filled 2015-07-08 (×2): qty 0.4

## 2015-07-08 MED ORDER — ACETAMINOPHEN 500 MG PO TABS: 500.0000 mg | ORAL_TABLET | Freq: Four times a day (QID) | ORAL | Status: DC | PRN

## 2015-07-08 MED ORDER — ALBUTEROL SULFATE (2.5 MG/3ML) 0.083% IN NEBU: 2.5000 mg | INHALATION_SOLUTION | Freq: Four times a day (QID) | RESPIRATORY_TRACT | Status: DC | PRN

## 2015-07-08 MED ORDER — VITAMIN B-12 1000 MCG PO TABS
1000.0000 ug | ORAL_TABLET | Freq: Every day | ORAL | Status: DC
  Administered 2015-07-09 – 2015-07-12 (×4): 1000 ug via ORAL
  Filled 2015-07-08 (×4): qty 1

## 2015-07-08 MED ORDER — CHOLECALCIFEROL 50 MCG (2000 UT) PO CAPS: 2000.0000 [IU] | ORAL_CAPSULE | Freq: Every day | ORAL | Status: DC

## 2015-07-08 MED ORDER — SODIUM CHLORIDE 0.9 % IV SOLN: 250.0000 mL | INTRAVENOUS | Status: DC | PRN

## 2015-07-08 MED ORDER — DOCUSATE SODIUM 100 MG PO CAPS
100.0000 mg | ORAL_CAPSULE | Freq: Two times a day (BID) | ORAL | Status: DC
  Administered 2015-07-08 – 2015-07-12 (×8): 100 mg via ORAL
  Filled 2015-07-08 (×8): qty 1

## 2015-07-08 MED ORDER — AMLODIPINE BESYLATE 5 MG PO TABS
5.0000 mg | ORAL_TABLET | Freq: Every morning | ORAL | Status: DC
  Administered 2015-07-09 – 2015-07-10 (×2): 5 mg via ORAL
  Filled 2015-07-08 (×3): qty 1

## 2015-07-08 MED ORDER — PANTOPRAZOLE SODIUM 40 MG PO TBEC
40.0000 mg | DELAYED_RELEASE_TABLET | Freq: Every day | ORAL | Status: DC
  Administered 2015-07-09 – 2015-07-12 (×4): 40 mg via ORAL
  Filled 2015-07-08 (×4): qty 1

## 2015-07-08 MED ORDER — FLORANEX PO PACK
1.0000 g | PACK | Freq: Three times a day (TID) | ORAL | Status: DC
  Administered 2015-07-08 – 2015-07-12 (×11): 1 g via ORAL
  Filled 2015-07-08 (×15): qty 1

## 2015-07-08 MED ORDER — METOPROLOL TARTRATE 50 MG PO TABS
50.0000 mg | ORAL_TABLET | Freq: Two times a day (BID) | ORAL | Status: DC
  Administered 2015-07-08 – 2015-07-12 (×6): 50 mg via ORAL
  Filled 2015-07-08 (×8): qty 1

## 2015-07-08 NOTE — ED Notes (Signed)
Pt can go up at 13;38

## 2015-07-08 NOTE — ED Provider Notes (Addendum)
CSN: [id number]     Arrival date & time 07/08/15  0935 History   First MD Initiated Contact with Patient 07/08/15 [phone]     Chief Complaint  Patient presents with  . Shortness of Breath     (Consider location/radiation/quality/duration/timing/severity/associated sxs/prior Treatment) HPI Comments: Patient is a 82 year old female with a history of CHF, COPD on 4 L of oxygen chronically, chronic kidney disease and recent hospitalization for healthcare associated pneumonia with discharge 7 days ago who is presenting from her nursing facility for shortness of breath and hypoxia. Patient finished Augmentin 2 days after discharge from the hospital. She states that things have been up and down since she left the hospital. Over the last 2 days she has noticed her cough getting worse and during coughing spells she will be short of breath. Symptoms seem to be improved with sitting up. She denies any weight gain or leg swelling. She states that she was given a breathing treatment yesterday without significant improvement in her symptoms. Nursing facility this morning states that she ED satted 83% on her home 4 L of oxygen however when EMS arrived patient was satting 95% and currently she is satting 94% and denies feeling short of breath but just feels tight. She denies any chest pain, fevers, abdominal pain, nausea or vomiting.  Patient is a 82 y.o. female presenting with shortness of breath. The history is provided by the patient, the nursing home and the EMS personnel.  Shortness of Breath Severity:  Moderate Onset quality:  Sudden Timing:  Intermittent Progression:  Worsening Chronicity:  Recurrent Associated symptoms: cough   Associated symptoms: no abdominal pain, no chest pain, no fever, no sputum production, no vomiting and no wheezing     Past Medical History  Diagnosis Date  . Hypertension   . Chronic diastolic CHF (congestive heart failure) (HCC)     a. 03/2015: 2D ECHO: EF 60-65%, mild AS and  AI, mildly dilated LA, mild diastolic dysfunction   . Parkinson's disease (HCC)   . COPD (chronic obstructive pulmonary disease) (HCC)     4L O2 baseline  . Heart block AV second degree     a. s/p Biotronik pulse generator: Serial # [id number] was successfully implanted on 04/12/15.   . S/P placement of cardiac pacemaker     a. Biotronik pulse generator: Serial # [id number] was successfully implanted on 04/12/15.   . Iron deficiency anemia     a. placed on iron and colase during 03/2016 admission   . CKD (chronic kidney disease)    Past Surgical History  Procedure Laterality Date  . Appendectomy      At age 47  . Total abdominal hysterectomy      Age 28  . Ep implantable device N/A 04/12/2015    Procedure: Pacemaker Implant;  Surgeon: Duke Salvia, MD;  Location: [facility] INVASIVE CV LAB;  Service: Cardiovascular;  Laterality: N/A;   Family History  Problem Relation Age of Onset  . Hypertension Mother   . Hypertension Father   . Heart attack Neg Hx   . Stroke Neg Hx    Social History  Substance Use Topics  . Smoking status: Never Smoker   . Smokeless tobacco: None  . Alcohol Use: No   OB History    No data available     Review of Systems  Constitutional: Negative for fever.  Respiratory: Positive for cough and shortness of breath. Negative for sputum production and wheezing.   Cardiovascular: Negative for chest pain.  Gastrointestinal: Negative for vomiting and abdominal pain.  All other systems reviewed and are negative.     Allergies  Review of patient's allergies indicates no known allergies.  Home Medications   Prior to Admission medications   Medication Sig Start Date End Date Taking? Authorizing Provider  acetaminophen (TYLENOL) 500 MG tablet Take 500 mg by mouth every 6 (six) hours as needed for mild pain.    Historical Provider, MD  albuterol (PROVENTIL HFA;VENTOLIN HFA) 108 (90 Base) MCG/ACT inhaler Inhale 2 puffs into the lungs every 6 (six) hours as needed  for wheezing or shortness of breath. 06/29/15   Albertine Grates, MD  albuterol (PROVENTIL) (2.5 MG/3ML) 0.083% nebulizer solution Take 3 mLs (2.5 mg total) by nebulization every 6 (six) hours as needed for wheezing or shortness of breath. 07/01/15   Leana Roe Elgergawy, MD  amLODipine (NORVASC) 5 MG tablet Take 5 mg by mouth every morning.    Historical Provider, MD  aspirin EC 81 MG tablet Take 81 mg by mouth daily.    Historical Provider, MD  celecoxib (CELEBREX) 100 MG capsule Take 100 mg by mouth daily.    Historical Provider, MD  Cholecalciferol (VITAMIN D3 SUPER STRENGTH) 2000 UNITS CAPS Take 2,000 Units by mouth daily.    Historical Provider, MD  citalopram (CELEXA) 20 MG tablet Take 20 mg by mouth daily.     Historical Provider, MD  docusate sodium (COLACE) 100 MG capsule Take 100 mg by mouth 2 (two) times daily.    Historical Provider, MD  ferrous sulfate 325 (65 FE) MG tablet Take 325 mg by mouth daily.    Historical Provider, MD  furosemide (LASIX) 20 MG tablet Take 20 mg by mouth daily.    Historical Provider, MD  guaiFENesin (MUCINEX) 600 MG 12 hr tablet Take 1 tablet (600 mg total) by mouth 2 (two) times daily. Please take for 2 weeks then stop 07/01/15   Starleen Arms, MD  HYDROcodone-acetaminophen (NORCO/VICODIN) 5-325 MG tablet Take 1 tablet by mouth 3 (three) times daily as needed for moderate pain. 07/01/15   Leana Roe Elgergawy, MD  ipratropium-albuterol (DUONEB) 0.5-2.5 (3) MG/3ML SOLN Take 3 mLs by nebulization 2 (two) times daily. 07/01/15   Leana Roe Elgergawy, MD  lactobacillus (FLORANEX/LACTINEX) PACK Take 1 packet (1 g total) by mouth 3 (three) times daily with meals. 06/29/15   Albertine Grates, MD  levothyroxine (SYNTHROID, LEVOTHROID) 88 MCG tablet Take 88 mcg by mouth daily before breakfast.    Historical Provider, MD  LORazepam (ATIVAN) 0.5 MG tablet Take HALF tablet by mouth, at bedtime, as needed for sleep. 07/01/15   Leana Roe Elgergawy, MD  metoprolol (LOPRESSOR) 50 MG tablet Take 1 tablet  (50 mg total) by mouth 2 (two) times daily. 04/14/15   Janetta Hora, PA-C  Multiple Vitamin (MULTIVITAMIN WITH MINERALS) TABS tablet Take 1 tablet by mouth daily.    Historical Provider, MD  omeprazole (PRILOSEC) 20 MG capsule Take 20 mg by mouth daily.    Historical Provider, MD  valsartan (DIOVAN) 160 MG tablet Take 160 mg by mouth daily.    Historical Provider, MD  vitamin B-12 (CYANOCOBALAMIN) 1000 MCG tablet Take 1,000 mcg by mouth daily.    Historical Provider, MD  vitamin C (ASCORBIC ACID) 500 MG tablet Take 500 mg by mouth daily.    Historical Provider, MD   BP 129/64 mmHg  Pulse 73  Temp(Src) 97.4 F (36.3 C) (Oral)  Resp 22  SpO2 94% Physical Exam  Constitutional: She  is oriented to person, place, and time. She appears well-developed and well-nourished. No distress.  HENT:  Head: Normocephalic and atraumatic.  Mouth/Throat: Oropharynx is clear and moist.  Eyes: Conjunctivae and EOM are normal. Pupils are equal, round, and reactive to light.  Neck: Normal range of motion. Neck supple.  Cardiovascular: Normal rate, regular rhythm and intact distal pulses.   Murmur heard.  Systolic murmur is present with a grade of 3/6  Murmur heard best in the left sternal border  Pulmonary/Chest: Effort normal. Tachypnea noted. No respiratory distress. She has no wheezes. She has rales in the right middle field, the right lower field, the left middle field and the left lower field.  Abdominal: Soft. She exhibits no distension. There is no tenderness. There is no rebound and no guarding.  Musculoskeletal: Normal range of motion. She exhibits no edema or tenderness.  Neurological: She is alert and oriented to person, place, and time.  Skin: Skin is warm and dry. No rash noted. No erythema.  Psychiatric: She has a normal mood and affect. Her behavior is normal.  Nursing note and vitals reviewed.   ED Course  Procedures (including critical care time) Labs Review Labs Reviewed  CBC WITH  DIFFERENTIAL/PLATELET - Abnormal; Notable for the following:    RBC 3.67 (*)    Hemoglobin 10.6 (*)    HCT 34.9 (*)    RDW 18.7 (*)    All other components within normal limits  COMPREHENSIVE METABOLIC PANEL - Abnormal; Notable for the following:    Glucose, Bld 109 (*)    BUN 22 (*)    Total Protein 6.3 (*)    All other components within normal limits  BRAIN NATRIURETIC PEPTIDE - Abnormal; Notable for the following:    B Natriuretic Peptide 327.8 (*)    All other components within normal limits  I-STAT TROPOININ, ED    Imaging Review Dg Chest 2 View  07/08/2015  CLINICAL DATA:  Shortness of breath.  Cough. EXAM: CHEST  2 VIEW COMPARISON:  06/30/2015 FINDINGS: Pacer with leads at right atrium and right ventricle. No lead discontinuity. Midline trachea. Cardiomegaly accentuated by AP portable technique. Probable small left pleural effusion. No pneumothorax. Extremely low lung volumes with moderate right hemidiaphragm elevation. Bibasilar opacities are similar and favored to represent areas of atelectasis and scarring. No definite lobar consolidation. No overt congestive failure. IMPRESSION: Cardiomegaly with probable small left pleural effusion. Similar bibasilar volume loss with scarring and atelectasis. No definite superimposed process. Electronically Signed   By: Jeronimo Greaves M.D.   On: 07/08/2015 11:03   I have personally reviewed and evaluated these images and lab results as part of my medical decision-making.  ED ECG REPORT   Date: 07/08/2015  Rate: 69  Rhythm: atrial -sensed ventricular paced rhythm  QRS Axis: normal  Intervals: normal  ST/T Wave abnormalities: indeterminate  Conduction Disutrbances:none  Narrative Interpretation:   Old EKG Reviewed: unchanged  I have personally reviewed the EKG tracing and agree with the computerized printout as noted.    MDM   Final diagnoses:  Acute on chronic congestive heart failure, unspecified congestive heart failure type [facility])   Hypoxia    Patient is a 82 year old lady who was recently hospitalized for healthcare associated pneumonia who is returning from the nursing facility today for shortness of breath and hypoxia. When patient was hospitalized she had a CT scan that showed evidence of a right lower lobe pneumonia but no evidence of a PE. She does have a history of  CHF and chronic kidney disease. Patient has had ups and downs in the last few days. She doesn't note a lot more coughing usually at night with shortness of breath that improves with sitting up. She did not notice much improvement after breathing treatment yesterday. Currently she has no complaints other than being a little tired but feels like her breathing is okay at the moment. She denies any signs of fluid overload however on exam patient has rales and a murmur present.  Concern today for possible recurrent healthcare associated pneumonia versus CHF as patient did get a dye load when she was here and may have worsening kidney function.  CBC, CMP, BNP, troponin, chest x-ray, EKG pending. At this time patient satting 94% on 4 L.  11:39 AM Patient's troponin, CBC and CMP are all without acute changes. BNP is stable however patient's weight has increased by 8 pounds and x-rays consistent with a small left pleural effusion and cardiomegaly. Patient does take 20 mg of Lasix daily and was given a dose of 40 mg here.    Gwyneth Sprout, MD 07/08/15 1230  Gwyneth Sprout, MD 07/08/15 1238

## 2015-07-08 NOTE — H&P (Addendum)
History and Physical:    Summer Graham   WUJ:[id number] DOB: [birth year] DOA: 07/08/2015  Referring MD/provider: Dr. Anitra Lauth PCP: Ginette Otto, MD   Chief Complaint: Worsening shortness of breath  History of Present Illness:   Summer Graham is an 82 y.o. female with a history of CHF, COPD, chronic respiratory failure on 4 L of oxygen, chronic kidney disease and recent hospitalization for healthcare associated pneumonia with discharge 7 days ago who returns today from her nursing facility for evaluation of shortness of breath and hypoxia. Patient finished Augmentin 2 days after discharge from the hospital. She states that things have been up and down since she left the hospital. Over the last 2 days she has noticed her cough getting worse and during coughing spells she will be short of breath. Symptoms seem to be improved with sitting up. She denies any weight gain or leg swelling. She states that she was given a breathing treatment yesterday without significant improvement in her symptoms. Nursing facility this morning states that she ED satted 83% on her home 4 L of oxygen however when EMS arrived patient was satting 95% and currently she is satting 94% and denies feeling short of breath but just feels tight. She denies any chest pain, fevers, abdominal pain, nausea or vomiting. Patient is extremely hard of hearing, but appears comfortable upon my assessment.  ROS:   Review of Systems  Constitutional: Negative for fever and chills.  HENT: Positive for hearing loss.   Eyes: Negative.   Respiratory: Positive for cough and shortness of breath. Negative for hemoptysis, sputum production and wheezing.   Cardiovascular: Negative.   Gastrointestinal: Negative.   Genitourinary: Negative.   Musculoskeletal: Negative.   Skin: Negative.   Neurological: Negative.  Negative for weakness.  Endo/Heme/Allergies: Negative.   Psychiatric/Behavioral: Negative.      Past Medical History:    Past Medical History  Diagnosis Date  . Hypertension   . Chronic diastolic CHF (congestive heart failure) (HCC)     a. 03/2015: 2D ECHO: EF 60-65%, mild AS and AI, mildly dilated LA, mild diastolic dysfunction   . Parkinson's disease (HCC)   . COPD (chronic obstructive pulmonary disease) (HCC)     4L O2 baseline  . Heart block AV second degree     a. s/p Biotronik pulse generator: Serial # [id number] was successfully implanted on 04/12/15.   . S/P placement of cardiac pacemaker     a. Biotronik pulse generator: Serial # [id number] was successfully implanted on 04/12/15.   . Iron deficiency anemia     a. placed on iron and colase during 03/2016 admission   . CKD (chronic kidney disease)   . HCAP (healthcare-associated pneumonia) 06/26/2015    Past Surgical History:   Past Surgical History  Procedure Laterality Date  . Appendectomy      At age 50  . Total abdominal hysterectomy      Age 71  . Ep implantable device N/A 04/12/2015    Procedure: Pacemaker Implant;  Surgeon: Duke Salvia, MD;  Location: [facility] INVASIVE CV LAB;  Service: Cardiovascular;  Laterality: N/A;    Social History:   Social History   Social History  . Marital Status: Widowed    Spouse Name: N/A  . Number of Children: N/A  . Years of Education: N/A   Occupational History  . Not on file.   Social History Main Topics  . Smoking status: Never Smoker   . Smokeless tobacco: Not on file  . Alcohol  Use: No  . Drug Use: No  . Sexual Activity: Not on file   Other Topics Concern  . Not on file   Social History Narrative   Currently resides at [facility] in [place]. Uses a walker and wheelchair for ambulation.    Family history:   Family History  Problem Relation Age of Onset  . Hypertension Mother   . Hypertension Father   . Heart attack Neg Hx   . Stroke Neg Hx     Allergies   Review of patient's allergies indicates no known allergies.  Current Medications:   Prior to Admission  medications   Medication Sig Start Date End Date Taking? Authorizing Provider  acetaminophen (TYLENOL) 500 MG tablet Take 500 mg by mouth every 6 (six) hours as needed for mild pain.   Yes Historical Provider, MD  albuterol (PROVENTIL HFA;VENTOLIN HFA) 108 (90 Base) MCG/ACT inhaler Inhale 2 puffs into the lungs every 6 (six) hours as needed for wheezing or shortness of breath. 06/29/15  Yes Albertine Grates, MD  albuterol (PROVENTIL) (2.5 MG/3ML) 0.083% nebulizer solution Take 3 mLs (2.5 mg total) by nebulization every 6 (six) hours as needed for wheezing or shortness of breath. 07/01/15  Yes Leana Roe Elgergawy, MD  amLODipine (NORVASC) 5 MG tablet Take 5 mg by mouth every morning.   Yes Historical Provider, MD  aspirin EC 81 MG tablet Take 81 mg by mouth daily.   Yes Historical Provider, MD  celecoxib (CELEBREX) 100 MG capsule Take 100 mg by mouth daily.   Yes Historical Provider, MD  Cholecalciferol (VITAMIN D3 SUPER STRENGTH) 2000 UNITS CAPS Take 2,000 Units by mouth daily.   Yes Historical Provider, MD  citalopram (CELEXA) 20 MG tablet Take 20 mg by mouth daily.    Yes Historical Provider, MD  docusate sodium (COLACE) 100 MG capsule Take 100 mg by mouth 2 (two) times daily.   Yes Historical Provider, MD  ferrous sulfate 325 (65 FE) MG tablet Take 325 mg by mouth daily.   Yes Historical Provider, MD  furosemide (LASIX) 20 MG tablet Take 20 mg by mouth daily.   Yes Historical Provider, MD  guaiFENesin (MUCINEX) 600 MG 12 hr tablet Take 1 tablet (600 mg total) by mouth 2 (two) times daily. Please take for 2 weeks then stop 07/01/15  Yes Starleen Arms, MD  HYDROcodone-acetaminophen (NORCO/VICODIN) 5-325 MG tablet Take 1 tablet by mouth 3 (three) times daily as needed for moderate pain. Patient taking differently: Take 1 tablet by mouth every 8 (eight) hours as needed for moderate pain.  07/01/15  Yes Leana Roe Elgergawy, MD  ipratropium-albuterol (DUONEB) 0.5-2.5 (3) MG/3ML SOLN Take 3 mLs by nebulization 2  (two) times daily. 07/01/15  Yes Starleen Arms, MD  lactobacillus (FLORANEX/LACTINEX) PACK Take 1 packet (1 g total) by mouth 3 (three) times daily with meals. Patient taking differently: Take 1 g by mouth 3 (three) times daily with meals. Started 02/04 06/29/15  Yes Albertine Grates, MD  levothyroxine (SYNTHROID, LEVOTHROID) 88 MCG tablet Take 88 mcg by mouth daily before breakfast.   Yes Historical Provider, MD  LORazepam (ATIVAN) 0.5 MG tablet Take HALF tablet by mouth, at bedtime, as needed for sleep. Patient taking differently: Take 0.25 mg by mouth at bedtime.  07/01/15  Yes Starleen Arms, MD  metoprolol (LOPRESSOR) 50 MG tablet Take 1 tablet (50 mg total) by mouth 2 (two) times daily. 04/14/15  Yes Janetta Hora, PA-C  Multiple Vitamin (MULTIVITAMIN WITH MINERALS) TABS tablet  Take 1 tablet by mouth daily.   Yes Historical Provider, MD  omeprazole (PRILOSEC) 20 MG capsule Take 20 mg by mouth daily.   Yes Historical Provider, MD  valsartan (DIOVAN) 160 MG tablet Take 160 mg by mouth daily.   Yes Historical Provider, MD  vitamin B-12 (CYANOCOBALAMIN) 1000 MCG tablet Take 1,000 mcg by mouth daily.   Yes Historical Provider, MD  vitamin C (ASCORBIC ACID) 500 MG tablet Take 500 mg by mouth daily.   Yes Historical Provider, MD    Physical Exam:   Filed Vitals:   07/08/15 1030 07/08/15 1045 07/08/15 1101 07/08/15 1130  BP:   120/52   Pulse:  69  66  Temp:      TempSrc:      Resp:  Weight: 66.395 kg (146 lb 6 oz)     SpO2:  90%  91%     Physical Exam: Blood pressure 120/52, pulse 66, temperature 97.4 F (36.3 C), temperature source Oral, resp. rate 25, weight 66.395 kg (146 lb 6 oz), SpO2 91 %. Gen: No acute distress. Head: Normocephalic, atraumatic. Eyes: PERRL, EOMI, sclerae nonicteric. Mouth: Oropharynx with dry mucous membranes, fair dentition. Neck: Supple, no thyromegaly, no lymphadenopathy, no jugular venous distention. Chest: Lungs with crackles and rhonchi  bilaterally. CV: Heart sounds are regular. No murmurs, rubs, or gallops. Abdomen: Soft, nontender, nondistended with normal active bowel sounds. Extremities: Extremities are without significant clubbing, edema, or cyanosis. Skin: Warm and dry. Neuro: Alert and oriented times 2; grossly nonfocal. Extremely hard of hearing. Psych: Mood and affect normal.   Data Review:    Labs: Basic Metabolic Panel:  Recent Labs Lab 07/08/15 1018  NA 141  K 4.5  CL 103  CO2 29  GLUCOSE 109*  BUN 22*  CREATININE 0.79  CALCIUM 9.0   Liver Function Tests:  Recent Labs Lab 07/08/15 1018  AST 22  ALT 14  ALKPHOS 76  BILITOT 0.6  PROT 6.3*  ALBUMIN 3.6   No results for input(s): LIPASE, AMYLASE in the last 168 hours. No results for input(s): AMMONIA in the last 168 hours. CBC:  Recent Labs Lab 07/08/15 1018  WBC 6.4  NEUTROABS 4.7  HGB 10.6*  HCT 34.9*  MCV 95.1  PLT 240   Cardiac Enzymes: No results for input(s): CKTOTAL, CKMB, CKMBINDEX, TROPONINI in the last 168 hours.  BNP (last 3 results) No results for input(s): PROBNP in the last 8760 hours. CBG: No results for input(s): GLUCAP in the last 168 hours.  Radiographic Studies: Dg Chest 2 View  07/08/2015  CLINICAL DATA:  Shortness of breath.  Cough. EXAM: CHEST  2 VIEW COMPARISON:  06/30/2015 FINDINGS: Pacer with leads at right atrium and right ventricle. No lead discontinuity. Midline trachea. Cardiomegaly accentuated by AP portable technique. Probable small left pleural effusion. No pneumothorax. Extremely low lung volumes with moderate right hemidiaphragm elevation. Bibasilar opacities are similar and favored to represent areas of atelectasis and scarring. No definite lobar consolidation. No overt congestive failure. IMPRESSION: Cardiomegaly with probable small left pleural effusion. Similar bibasilar volume loss with scarring and atelectasis. No definite superimposed process. Electronically Signed   By: Jeronimo Greaves M.D.    On: 07/08/2015 11:03   *I have personally reviewed the images above*  EKG: Pending.   Assessment/Plan:   Principal Problem:   Diastolic CHF, acute on chronic (HCC) - Likely secondary to volume overload in the setting of recent hospitalization. - Admit to telemetry and diurese with Lasix 40  mg IV every 8 hours. - Continue metoprolol and ARB. Continue aspirin. - Monitor on telemetry. - No need to repeat echo, which was done 04/09/15: EF of 60-65%, grade 1 diastolic dysfunction.  Active Problems:   Essential hypertension - Continue metoprolol, Diovan, Norvasc and Lasix.    Iron deficiency anemia - Continue iron supplementation.    Hypothyroidism - Continue Synthroid.    S/P placement of cardiac pacemaker    DVT prophylaxis - Lovenox ordered.  Code Status / Family Communication / Disposition Plan:   Code Status: Full. Family Communication: DO NOT RESUSCITATE Disposition Plan: Return to SNF 07/09/15 if respiratory status stable.   Time spent: One hour.  Earleen Aoun [facility] Pager [phone] Cell: [phone]   If 7PM-7AM, please contact night-coverage [email or web address] Password [email or web address] 07/08/2015, 1:03 PM

## 2015-07-08 NOTE — ED Notes (Signed)
Per EMS, pt from [facility].  Pt c/o shortness of breath.  Started this morning.  Cough with productive sputum.  Normal 4l per [place].  Pt staff had 80% sat on 4l.  No c/o pain.  Pt with hx of CHF.  Recent dx of pneumonia.  Vitals: 72 pacemaker, bp 132/68, resp 16, 95% on 4l per 

## 2015-07-08 NOTE — ED Notes (Signed)
Bed: ZO10 Expected date:  Expected time:  Means of arrival:  Comments: 53F/SOB

## 2015-07-08 NOTE — Progress Notes (Addendum)
82 yr old [employer] [facility] pt  Last admission 06/25/15- 07/01/15 for HCAPwith d/c to snf Returning with sob  dx CHF

## 2015-07-09 DIAGNOSIS — I441 Atrioventricular block, second degree: Secondary | ICD-10-CM | POA: Diagnosis present

## 2015-07-09 DIAGNOSIS — J449 Chronic obstructive pulmonary disease, unspecified: Secondary | ICD-10-CM | POA: Diagnosis present

## 2015-07-09 DIAGNOSIS — Z950 Presence of cardiac pacemaker: Secondary | ICD-10-CM | POA: Diagnosis not present

## 2015-07-09 DIAGNOSIS — Z8701 Personal history of pneumonia (recurrent): Secondary | ICD-10-CM | POA: Diagnosis not present

## 2015-07-09 DIAGNOSIS — Z8249 Family history of ischemic heart disease and other diseases of the circulatory system: Secondary | ICD-10-CM | POA: Diagnosis not present

## 2015-07-09 DIAGNOSIS — E039 Hypothyroidism, unspecified: Secondary | ICD-10-CM | POA: Diagnosis present

## 2015-07-09 DIAGNOSIS — Z7982 Long term (current) use of aspirin: Secondary | ICD-10-CM | POA: Diagnosis not present

## 2015-07-09 DIAGNOSIS — T501X5A Adverse effect of loop [high-ceiling] diuretics, initial encounter: Secondary | ICD-10-CM | POA: Diagnosis not present

## 2015-07-09 DIAGNOSIS — Z79899 Other long term (current) drug therapy: Secondary | ICD-10-CM | POA: Diagnosis not present

## 2015-07-09 DIAGNOSIS — I509 Heart failure, unspecified: Secondary | ICD-10-CM

## 2015-07-09 DIAGNOSIS — Z66 Do not resuscitate: Secondary | ICD-10-CM | POA: Diagnosis present

## 2015-07-09 DIAGNOSIS — I10 Essential (primary) hypertension: Secondary | ICD-10-CM

## 2015-07-09 DIAGNOSIS — I5033 Acute on chronic diastolic (congestive) heart failure: Principal | ICD-10-CM

## 2015-07-09 DIAGNOSIS — Z90710 Acquired absence of both cervix and uterus: Secondary | ICD-10-CM | POA: Diagnosis not present

## 2015-07-09 DIAGNOSIS — I110 Hypertensive heart disease with heart failure: Secondary | ICD-10-CM | POA: Diagnosis present

## 2015-07-09 DIAGNOSIS — G20 Parkinson's disease: Secondary | ICD-10-CM | POA: Diagnosis present

## 2015-07-09 DIAGNOSIS — D509 Iron deficiency anemia, unspecified: Secondary | ICD-10-CM | POA: Diagnosis present

## 2015-07-09 DIAGNOSIS — Z9981 Dependence on supplemental oxygen: Secondary | ICD-10-CM | POA: Diagnosis not present

## 2015-07-09 DIAGNOSIS — R0602 Shortness of breath: Secondary | ICD-10-CM | POA: Diagnosis present

## 2015-07-09 DIAGNOSIS — J9610 Chronic respiratory failure, unspecified whether with hypoxia or hypercapnia: Secondary | ICD-10-CM | POA: Diagnosis present

## 2015-07-09 DIAGNOSIS — H9190 Unspecified hearing loss, unspecified ear: Secondary | ICD-10-CM | POA: Diagnosis present

## 2015-07-09 DIAGNOSIS — N179 Acute kidney failure, unspecified: Secondary | ICD-10-CM | POA: Diagnosis not present

## 2015-07-09 LAB — BASIC METABOLIC PANEL
ANION GAP: 10 (ref 5–15)
BUN: 21 mg/dL — AB (ref 6–20)
CHLORIDE: 100 mmol/L — AB (ref 101–111)
CO2: 33 mmol/L — AB (ref 22–32)
Calcium: 9.3 mg/dL (ref 8.9–10.3)
Creatinine, Ser: 0.85 mg/dL (ref 0.44–1.00)
GFR calc Af Amer: >60 mL/min (ref >60)
GFR, EST NON AFRICAN AMERICAN: 56 mL/min — AB (ref >60)
GLUCOSE: 95 mg/dL (ref 65–99)
POTASSIUM: 4 mmol/L (ref 3.5–5.1)
Sodium: 143 mmol/L (ref 135–145)

## 2015-07-09 MED ORDER — FUROSEMIDE 10 MG/ML IJ SOLN
40.0000 mg | Freq: Two times a day (BID) | INTRAMUSCULAR | Status: DC
  Administered 2015-07-09 – 2015-07-10 (×2): 40 mg via INTRAVENOUS
  Filled 2015-07-09 (×2): qty 4

## 2015-07-09 NOTE — Progress Notes (Signed)
[facility] PROGRESS NOTE  Summer Graham ZOX:[id number] DOB: Nov 20, 1915 DOA: 07/08/2015  PCP: Ginette Otto, MD  Brief HPI: 82 year old Caucasian female who presented from a skilled nursing facility with the complaint of worsening shortness of breath. Patient was recently hospitalized and treated for healthcare associated pneumonia and was discharged 7 days prior to this hospitalization. Chest x-ray did not show any new infiltrates. Concern was about diastolic congestive heart failure and the patient was hospitalized for further management. Patient is on oxygen at her skilled nursing facility.  Past medical history:  Past Medical History  Diagnosis Date  . Hypertension   . Chronic diastolic CHF (congestive heart failure) (HCC)     a. 03/2015: 2D ECHO: EF 60-65%, mild AS and AI, mildly dilated LA, mild diastolic dysfunction   . Parkinson's disease (HCC)   . COPD (chronic obstructive pulmonary disease) (HCC)     4L O2 baseline  . Heart block AV second degree     a. s/p Biotronik pulse generator: Serial # [id number] was successfully implanted on 04/12/15.   . S/P placement of cardiac pacemaker     a. Biotronik pulse generator: Serial # [id number] was successfully implanted on 04/12/15.   . Iron deficiency anemia     a. placed on iron and colase during 03/2016 admission   . CKD (chronic kidney disease)   . HCAP (healthcare-associated pneumonia) 06/26/2015    Consultants: None  Procedures: None  Antibiotics: None  Subjective: Patient is feeling sleepy this morning. She states that her breathing is much better. Denies any chest pain, nausea or vomiting.  Objective: Vital Signs  Filed Vitals:   07/08/15 2047 07/08/15 2127 07/08/15 2212 07/09/15 0521  BP: 111/57  126/65 124/53  Pulse: 81  83 70  Temp: 98.2 F (36.8 C)   97.6 F (36.4 C)  TempSrc: Oral   Oral  Resp: 18   20  Height:      Weight:    64.638 kg (142 lb 8 oz)  SpO2: 96% 93%  91%    Intake/Output  Summary (Last 24 hours) at 07/09/15 0918 Last data filed at 07/08/15 1700  Gross per 24 hour  Intake    120 ml  Output      0 ml  Net    120 ml   Filed Weights   07/08/15 1030 07/08/15 1412 07/09/15 0521  Weight: 66.395 kg (146 lb 6 oz) 65.3 kg (143 lb 15.4 oz) 64.638 kg (142 lb 8 oz)    General appearance: alert, cooperative, appears stated age and no distress Resp: Diminished air entry at the bases. No definite crackles or wheezing heard. Cardio: regular rate and rhythm, S1, S2 normal, no murmur, click, rub or gallop GI: soft, non-tender; bowel sounds normal; no masses,  no organomegaly Extremities: extremities normal, atraumatic, no cyanosis or edema Neurologic: She is awake and alert. Oriented to place. No focal neurological deficits.  Lab Results:  Basic Metabolic Panel:  Recent Labs Lab 07/08/15 1018 07/09/15 0506  NA 141 143  K 4.5 4.0  CL 103 100*  CO2 29 33*  GLUCOSE 109* 95  BUN 22* 21*  CREATININE 0.79 0.85  CALCIUM 9.0 9.3   Liver Function Tests:  Recent Labs Lab 07/08/15 1018  AST 22  ALT 14  ALKPHOS 76  BILITOT 0.6  PROT 6.3*  ALBUMIN 3.6   CBC:  Recent Labs Lab 07/08/15 1018  WBC 6.4  NEUTROABS 4.7  HGB 10.6*  HCT 34.9*  MCV 95.1  PLT 240  BNP (last 3 results)  Recent Labs  04/08/15 1108 06/25/15 2246 07/08/15 1018  BNP 951.7* 473.5* 327.8*     Studies/Results: Dg Chest 2 View  07/08/2015  CLINICAL DATA:  Shortness of breath.  Cough. EXAM: CHEST  2 VIEW COMPARISON:  06/30/2015 FINDINGS: Pacer with leads at right atrium and right ventricle. No lead discontinuity. Midline trachea. Cardiomegaly accentuated by AP portable technique. Probable small left pleural effusion. No pneumothorax. Extremely low lung volumes with moderate right hemidiaphragm elevation. Bibasilar opacities are similar and favored to represent areas of atelectasis and scarring. No definite lobar consolidation. No overt congestive failure. IMPRESSION: Cardiomegaly  with probable small left pleural effusion. Similar bibasilar volume loss with scarring and atelectasis. No definite superimposed process. Electronically Signed   By: Jeronimo Greaves M.D.   On: 07/08/2015 11:03    Medications:  Scheduled: . amLODipine  5 mg Oral q morning - 10a  . aspirin EC  81 mg Oral Daily  . celecoxib  100 mg Oral Daily  . cholecalciferol  2,000 Units Oral Daily  . citalopram  20 mg Oral Daily  . docusate sodium  100 mg Oral BID  . enoxaparin (LOVENOX) injection  40 mg Subcutaneous Q24H  . ferrous sulfate  325 mg Oral Daily  . furosemide  40 mg Intravenous Q12H  . guaiFENesin  600 mg Oral BID  . ipratropium-albuterol  3 mL Nebulization BID  . irbesartan  150 mg Oral Daily  . lactobacillus  1 g Oral TID WC  . levothyroxine  88 mcg Oral QAC breakfast  . LORazepam  0.25 mg Oral QHS  . metoprolol  50 mg Oral BID  . multivitamin with minerals  1 tablet Oral Daily  . pantoprazole  40 mg Oral Daily  . sodium chloride flush  3 mL Intravenous Q12H  . vitamin B-12  1,000 mcg Oral Daily  . vitamin C  500 mg Oral Daily   Continuous:  ZOX:WRUEAV chloride, acetaminophen, albuterol, ondansetron (ZOFRAN) IV, sodium chloride flush  Assessment/Plan:  Principal Problem:   Diastolic CHF, acute on chronic (HCC) Active Problems:   Essential hypertension   Iron deficiency anemia   S/P placement of cardiac pacemaker   Hypothyroidism    Acute on chronic diastolic congestive heart failure Patient was given IV Lasix. Ins and outs are not charted. Check daily weights. Cut back on dose of Lasix. Mobilize the patient. Continue her beta blocker and ARB. Continue aspirin. Echocardiogram was done in 2016 and showed EF of 60-625% with grade 1 diastolic dysfunction.  History of essential hypertension Blood pressure stable. Continue with her home medications.  Iron deficiency anemia. Continue iron supplementation.  Hypothyroidism. Continue Synthroid.  History of cardiac pacemaker  placement. She has a paced rhythm. Continue to monitor.  History of COPD with chronic respiratory failure on home oxygen Stable. Continue with her oxygen.  DVT Prophylaxis: Lovenox    Code Status: DO NOT RESUSCITATE  Family Communication: Discussed with the patient.   Disposition Plan: Mobilize. PT and OT evaluation. Anticipate discharge back to SNF tomorrow.      [facility]  [facility] Pager [phone] 07/09/2015, 9:18 AM  If 7PM-7AM, please contact night-coverage at [email or web address], password [facility]

## 2015-07-09 NOTE — [place] FL2 (Signed)
[place] MEDICAID FL2 LEVEL OF CARE SCREENING TOOL     IDENTIFICATION  Patient Name: Summer Graham Birthdate: 1915/09/30 Sex: female Admission Date (Current Location): 07/08/2015  [facility] and [place] Number:  Producer, television/film/video and Address:  [facility],  [address] [place], [place] [address]      Provider Number: [id number]  Attending Physician Name and Address:  Osvaldo Shipper, MD  Relative Name and Phone Number:  Detrice Cales [phone])    Current Level of Care: Hospital Recommended Level of Care: Skilled Nursing Facility Prior Approval Number:    Date Approved/Denied:   PASRR Number: [id number]  Discharge Plan: SNF    Current Diagnoses: Patient Active Problem List   Diagnosis Date Noted  . Acute CHF (congestive heart failure) (HCC) 07/09/2015  . Diastolic CHF, acute on chronic (HCC) 07/08/2015  . Hypothyroidism 07/08/2015  . S/P placement of cardiac pacemaker   . CKD (chronic kidney disease)   . Essential hypertension 04/10/2015  . Iron deficiency anemia 04/10/2015  . Heart block AV second degree 04/08/2015    Orientation RESPIRATION BLADDER Height & Weight     Self, Time, Situation, Place  O2 (4.5L) Incontinent Weight: 142 lb 8 oz (64.638 kg) Height:   (160 cm)  BEHAVIORAL SYMPTOMS/MOOD NEUROLOGICAL BOWEL NUTRITION STATUS   (n/a)  (NONE) Incontinent Diet (Diet 2 gram sodium )  AMBULATORY STATUS COMMUNICATION OF NEEDS Skin   Limited Assist Verbally Other (Comment) (small red open area left posterior thigh)                       Personal Care Assistance Level of Assistance  Bathing, Feeding, Dressing Bathing Assistance: Limited assistance Feeding assistance: Independent Dressing Assistance: Limited assistance     Functional Limitations Info  Hearing   Hearing Info: Impaired      SPECIAL CARE FACTORS FREQUENCY  PT (By licensed PT), OT (By licensed OT)     PT Frequency: 5 x a week OT Frequency: 5 x a week             Contractures Contractures Info: Not present    Additional Factors Info  Code Status Code Status Info: DNR code status Allergies Info: No Known Allergies           Current Medications (07/09/2015):  This is the current hospital active medication list Current Facility-Administered Medications  Medication Dose Route Frequency Provider Last Rate Last Dose  . 0.9 %  sodium chloride infusion  250 mL Intravenous PRN Maryruth Bun Rama, MD      . acetaminophen (TYLENOL) tablet 650 mg  650 mg Oral Q4H PRN Christina P Rama, MD      . albuterol (PROVENTIL) (2.5 MG/3ML) 0.083% nebulizer solution 2.5 mg  2.5 mg Nebulization Q6H PRN Christina P Rama, MD      . amLODipine (NORVASC) tablet 5 mg  5 mg Oral q morning - 10a Maryruth Bun Rama, MD   5 mg at 07/09/15 1113  . aspirin EC tablet 81 mg  81 mg Oral Daily Maryruth Bun Rama, MD   81 mg at 07/09/15 1108  . celecoxib (CELEBREX) capsule 100 mg  100 mg Oral Daily Maryruth Bun Rama, MD   100 mg at 07/09/15 1107  . cholecalciferol (VITAMIN D) tablet 2,000 Units  2,000 Units Oral Daily Maryruth Bun Rama, MD   2,000 Units at 07/09/15 1105  . citalopram (CELEXA) tablet 20 mg  20 mg Oral Daily Maryruth Bun Rama, MD   20  mg at 07/09/15 1108  . docusate sodium (COLACE) capsule 100 mg  100 mg Oral BID Maryruth Bun Rama, MD   100 mg at 07/09/15 1107  . enoxaparin (LOVENOX) injection 40 mg  40 mg Subcutaneous Q24H Maryruth Bun Rama, MD   40 mg at 07/08/15 1736  . ferrous sulfate tablet 325 mg  325 mg Oral Daily Maryruth Bun Rama, MD   325 mg at 07/09/15 1107  . furosemide (LASIX) injection 40 mg  40 mg Intravenous Q12H Osvaldo Shipper, MD      . guaiFENesin (MUCINEX) 12 hr tablet 600 mg  600 mg Oral BID Maryruth Bun Rama, MD   600 mg at 07/09/15 1107  . ipratropium-albuterol (DUONEB) 0.5-2.5 (3) MG/3ML nebulizer solution 3 mL  3 mL Nebulization BID Maryruth Bun Rama, MD   3 mL at 07/09/15 1307  . irbesartan (AVAPRO) tablet 150 mg  150 mg Oral Daily Maryruth Bun Rama, MD    150 mg at 07/09/15 1107  . lactobacillus (FLORANEX/LACTINEX) granules 1 g  1 g Oral TID WC Maryruth Bun Rama, MD   1 g at 07/09/15 1115  . levothyroxine (SYNTHROID, LEVOTHROID) tablet 88 mcg  88 mcg Oral QAC breakfast Maryruth Bun Rama, MD   88 mcg at 07/09/15 0857  . LORazepam (ATIVAN) tablet 0.25 mg  0.25 mg Oral QHS Maryruth Bun Rama, MD   0.25 mg at 07/08/15 2211  . metoprolol (LOPRESSOR) tablet 50 mg  50 mg Oral BID Maryruth Bun Rama, MD   50 mg at 07/09/15 1109  . multivitamin with minerals tablet 1 tablet  1 tablet Oral Daily Maryruth Bun Rama, MD   1 tablet at 07/09/15 1108  . ondansetron (ZOFRAN) injection 4 mg  4 mg Intravenous Q6H PRN Christina P Rama, MD      . pantoprazole (PROTONIX) EC tablet 40 mg  40 mg Oral Daily Maryruth Bun Rama, MD   40 mg at 07/09/15 1106  . sodium chloride flush (NS) 0.9 % injection 3 mL  3 mL Intravenous Q12H Maryruth Bun Rama, MD   3 mL at 07/08/15 2212  . sodium chloride flush (NS) 0.9 % injection 3 mL  3 mL Intravenous PRN Maryruth Bun Rama, MD   3 mL at 07/09/15 1118  . vitamin B-12 (CYANOCOBALAMIN) tablet 1,000 mcg  1,000 mcg Oral Daily Maryruth Bun Rama, MD   1,000 mcg at 07/09/15 1106  . vitamin C (ASCORBIC ACID) tablet 500 mg  500 mg Oral Daily Maryruth Bun Rama, MD   500 mg at 07/09/15 1106     Discharge Medications: Please see discharge summary for a list of discharge medications.  Relevant Imaging Results:  Relevant Lab Results:   Additional Information SS# [id number]  Brandy Kabat, Selena Lesser A, LCSW

## 2015-07-09 NOTE — Care Management Note (Signed)
Case Management Note  Patient Details  Name: Summer Graham MRN: [id number] Date of Birth: 1915-10-24  Subjective/Objective:  82 y/o f admitted w/CHF. From SNF-[facility]-CSW following.                  Action/Plan:d/c plan SNF   Expected Discharge Date:   (UNKNOWN)               Expected Discharge Plan:  Skilled Nursing Facility  In-House Referral:  Clinical Social Work  Discharge planning Services  CM Consult  Post Acute Care Choice:    Choice offered to:     DME Arranged:    DME Agency:     HH Arranged:    HH Agency:     Status of Service:  In process, will continue to follow  Medicare Important Message Given:    Date Medicare IM Given:    Medicare IM give by:    Date Additional Medicare IM Given:    Additional Medicare Important Message give by:     If discussed at Long Length of Stay Meetings, dates discussed:    Additional Comments:  Lanier Clam, RN 07/09/2015, 12:37 PM

## 2015-07-10 DIAGNOSIS — N179 Acute kidney failure, unspecified: Secondary | ICD-10-CM

## 2015-07-10 LAB — BASIC METABOLIC PANEL
ANION GAP: 12 (ref 5–15)
BUN: 38 mg/dL — AB (ref 6–20)
CO2: 31 mmol/L (ref 22–32)
Calcium: 8.9 mg/dL (ref 8.9–10.3)
Chloride: 96 mmol/L — ABNORMAL LOW (ref 101–111)
Creatinine, Ser: 1.8 mg/dL — ABNORMAL HIGH (ref 0.44–1.00)
GFR, EST AFRICAN AMERICAN: 26 mL/min — AB (ref >60)
GFR, EST NON AFRICAN AMERICAN: 22 mL/min — AB (ref >60)
Glucose, Bld: 105 mg/dL — ABNORMAL HIGH (ref 65–99)
POTASSIUM: 4 mmol/L (ref 3.5–5.1)
SODIUM: 139 mmol/L (ref 135–145)

## 2015-07-10 MED ORDER — ENOXAPARIN SODIUM 30 MG/0.3ML ~~LOC~~ SOLN
30.0000 mg | SUBCUTANEOUS | Status: DC
  Administered 2015-07-10 – 2015-07-11 (×2): 30 mg via SUBCUTANEOUS
  Filled 2015-07-10 (×2): qty 0.3

## 2015-07-10 MED ORDER — SODIUM CHLORIDE 0.9 % IV BOLUS (SEPSIS)
250.0000 mL | Freq: Once | INTRAVENOUS | Status: AC
  Administered 2015-07-10: 250 mL via INTRAVENOUS

## 2015-07-10 NOTE — Progress Notes (Signed)
[facility] PROGRESS NOTE  Summer Graham ZOX:[id number] DOB: 06-01-1915 DOA: 07/08/2015  PCP: Ginette Otto, MD  Brief HPI: 82 year old Caucasian female who presented from a skilled nursing facility with the complaint of worsening shortness of breath. Patient was recently hospitalized and treated for healthcare associated pneumonia and was discharged 7 days prior to this hospitalization. Chest x-ray did not show any new infiltrates. Concern was about diastolic congestive heart failure and the patient was hospitalized for further management. Patient is on oxygen at her skilled nursing facility.  Past medical history:  Past Medical History  Diagnosis Date  . Hypertension   . Chronic diastolic CHF (congestive heart failure) (HCC)     a. 03/2015: 2D ECHO: EF 60-65%, mild AS and AI, mildly dilated LA, mild diastolic dysfunction   . Parkinson's disease (HCC)   . COPD (chronic obstructive pulmonary disease) (HCC)     4L O2 baseline  . Heart block AV second degree     a. s/p Biotronik pulse generator: Serial # [id number] was successfully implanted on 04/12/15.   . S/P placement of cardiac pacemaker     a. Biotronik pulse generator: Serial # [id number] was successfully implanted on 04/12/15.   . Iron deficiency anemia     a. placed on iron and colase during 03/2016 admission   . CKD (chronic kidney disease)   . HCAP (healthcare-associated pneumonia) 06/26/2015    Consultants: None  Procedures: None  Antibiotics: None  Subjective: Patient overall feels better. Breathing is improved. Denies any new complaints.   Objective: Vital Signs  Filed Vitals:   07/09/15 1400 07/09/15 2100 07/09/15 2123 07/10/15 0500  BP: 95/46 94/55  130/62  Pulse: 77 80  74  Temp: 97.5 F (36.4 C) 97.9 F (36.6 C)  97.7 F (36.5 C)  TempSrc: Oral Oral  Oral  Resp: 16 16  16   Height:      Weight:    65 kg (143 lb 4.8 oz)  SpO2: 94% 95% 93% 98%    Intake/Output Summary (Last 24 hours) at  07/10/15 0739 Last data filed at 07/09/15 1758  Gross per 24 hour  Intake    920 ml  Output      0 ml  Net    920 ml   Filed Weights   07/08/15 1412 07/09/15 0521 07/10/15 0500  Weight: 65.3 kg (143 lb 15.4 oz) 64.638 kg (142 lb 8 oz) 65 kg (143 lb 4.8 oz)    General appearance: alert, cooperative, appears stated age and no distress Resp: Few crackles noted at the bases. However, they diminish with deep breathing. No wheezing.  Cardio: regular rate and rhythm, S1, S2 normal, no murmur, click, rub or gallop GI: soft, non-tender; bowel sounds normal; no masses,  no organomegaly Extremities: extremities normal, atraumatic, no cyanosis or edema Neurologic: She is awake and alert. Oriented to place. No focal neurological deficits.  Lab Results:  Basic Metabolic Panel:  Recent Labs Lab 07/08/15 1018 07/09/15 0506 07/10/15 0514  NA 141 143 139  K 4.5 4.0 4.0  CL 103 100* 96*  CO2 29 33* 31  GLUCOSE 109* 95 105*  BUN 22* 21* 38*  CREATININE 0.79 0.85 1.80*  CALCIUM 9.0 9.3 8.9   Liver Function Tests:  Recent Labs Lab 07/08/15 1018  AST 22  ALT 14  ALKPHOS 76  BILITOT 0.6  PROT 6.3*  ALBUMIN 3.6   CBC:  Recent Labs Lab 07/08/15 1018  WBC 6.4  NEUTROABS 4.7  HGB 10.6*  HCT  34.9*  MCV 95.1  PLT 240   BNP (last 3 results)  Recent Labs  04/08/15 1108 06/25/15 2246 07/08/15 1018  BNP 951.7* 473.5* 327.8*     Studies/Results: Dg Chest 2 View  07/08/2015  CLINICAL DATA:  Shortness of breath.  Cough. EXAM: CHEST  2 VIEW COMPARISON:  06/30/2015 FINDINGS: Pacer with leads at right atrium and right ventricle. No lead discontinuity. Midline trachea. Cardiomegaly accentuated by AP portable technique. Probable small left pleural effusion. No pneumothorax. Extremely low lung volumes with moderate right hemidiaphragm elevation. Bibasilar opacities are similar and favored to represent areas of atelectasis and scarring. No definite lobar consolidation. No overt  congestive failure. IMPRESSION: Cardiomegaly with probable small left pleural effusion. Similar bibasilar volume loss with scarring and atelectasis. No definite superimposed process. Electronically Signed   By: Jeronimo Greaves M.D.   On: 07/08/2015 11:03    Medications:  Scheduled: . amLODipine  5 mg Oral q morning - 10a  . aspirin EC  81 mg Oral Daily  . celecoxib  100 mg Oral Daily  . cholecalciferol  2,000 Units Oral Daily  . citalopram  20 mg Oral Daily  . docusate sodium  100 mg Oral BID  . enoxaparin (LOVENOX) injection  40 mg Subcutaneous Q24H  . ferrous sulfate  325 mg Oral Daily  . guaiFENesin  600 mg Oral BID  . ipratropium-albuterol  3 mL Nebulization BID  . lactobacillus  1 g Oral TID WC  . levothyroxine  88 mcg Oral QAC breakfast  . LORazepam  0.25 mg Oral QHS  . metoprolol  50 mg Oral BID  . multivitamin with minerals  1 tablet Oral Daily  . pantoprazole  40 mg Oral Daily  . sodium chloride flush  3 mL Intravenous Q12H  . vitamin B-12  1,000 mcg Oral Daily  . vitamin C  500 mg Oral Daily   Continuous:  ZOX:WRUEAV chloride, acetaminophen, albuterol, ondansetron (ZOFRAN) IV, sodium chloride flush  Assessment/Plan:  Principal Problem:   Diastolic CHF, acute on chronic (HCC) Active Problems:   Essential hypertension   Iron deficiency anemia   S/P placement of cardiac pacemaker   Hypothyroidism   Acute CHF (congestive heart failure) (HCC)    Acute on chronic diastolic congestive heart failure Patient improved with intravenous Lasix. Creatinine noted to be significantly higher today compared to yesterday. Most likely as a result of Lasix. We will stop IV Lasix. Hold her ARB. Repeat blood work tomorrow morning. If creatinine remains stable, then she could be discharged back to her skilled nursing facility. Continue to mobilize the patient. Continue beta blocker. Continue aspirin. Echocardiogram was done in 2016 and showed EF of 60-625% with grade 1 diastolic  dysfunction.  Acute renal failure Creatinine noted to be significantly higher today compared to yesterday. Most likely as a result of Lasix. We will stop IV Lasix. Hold her ARB. Repeat blood work tomorrow morning. Monitor urine output  History of essential hypertension Blood pressure stable. Continue with her home medications.  Iron deficiency anemia. Continue iron supplementation.  Hypothyroidism. Continue Synthroid.  History of cardiac pacemaker placement. She has a paced rhythm. Continue to monitor.  History of COPD with chronic respiratory failure on home oxygen Stable. Continue with her oxygen.  DVT Prophylaxis: Lovenox    Code Status: DO NOT RESUSCITATE  Family Communication: Discussed with the patient.   Disposition Plan: Mobilize. PT and OT evaluation. Anticipate discharge back to SNF on Sunday if creatinine is stable.    LOS: 1 day  [facility]  [facility] Pager [phone] 07/10/2015, 7:39 AM  If 7PM-7AM, please contact night-coverage at [email or web address], password [facility]

## 2015-07-10 NOTE — Progress Notes (Signed)
OT Cancellation Note  Patient Details Name: Jandy Brackens MRN: [id number] DOB: [birth year]   Cancelled Treatment:    Reason Eval/Treat Not Completed: Other (comment) (From SNF, defer OT needs to SNF). Pt is from SNF. Will defer OT needs to SNF at d/c. Acute OT signing off.   Pilar Grammes 07/10/2015, 2:15 PM

## 2015-07-10 NOTE — Evaluation (Signed)
Physical Therapy Evaluation Patient Details Name: Summer Graham MRN: [id number] DOB: January 08, 1916 Today's Date: 07/10/2015   History of Present Illness  82 yo female admitted with CHF. Hx of HTN, CHF, arthritis, O2 dependency. Pt is from SNF  Clinical Impression  On eval, pt was Min guard assist for bed mobility and Min assist to scoot towards HOB. Sat EOB ~5 minutes and performed LAQs x 5 reps each leg. Remained on [place] O2. Assisted pt back to bed. Recommend return to SNF.    Follow Up Recommendations Supervision/Assistance - 24 hour;SNF    Equipment Recommendations  None recommended by PT    Recommendations for Other Services       Precautions / Restrictions Precautions Precautions: Fall Restrictions Weight Bearing Restrictions: No      Mobility  Bed Mobility Overal bed mobility: Needs Assistance Bed Mobility: Supine to Sit;Sit to Supine     Supine to sit: Min guard Sit to supine: Min guard   General bed mobility comments: close guard for safety.   Transfers Overall transfer level: Needs assistance   Transfers: Lateral/Scoot Transfers          Lateral/Scoot Transfers: Min assist General transfer comment: Assist and use of bedpad for lateral scoot towards HOB.   Ambulation/Gait             General Gait Details: pt is nonambulatory  Information systems manager [place] (Stroke Patients Only)       Balance Overall balance assessment: Needs assistance Sitting-balance support: Feet supported Sitting balance-Leahy Scale: Good                                       Pertinent Vitals/Pain Pain Assessment: No/denies pain    Home Living Family/patient expects to be discharged to:: Skilled nursing facility               Home Equipment: Wheelchair - manual      Prior Function Level of Independence: Needs assistance   Gait / Transfers Assistance Needed: assist to get into w/c; propels with hands           Hand Dominance        Extremity/Trunk Assessment   Upper Extremity Assessment: Generalized weakness             RLE Deficits / Details: Limited knee flexion with severe arthritis, strength 3+/5 LLE Deficits / Details: Limited knee flexion with severe arthritis with audible crepitus, strength 3/5     Communication   Communication: HOH  Cognition Arousal/Alertness: Awake/alert Behavior During Therapy: WFL for tasks assessed/performed Overall Cognitive Status: Within Functional Limits for tasks assessed                      General Comments      Exercises        Assessment/Plan    PT Assessment Patient needs continued PT services  PT Diagnosis Generalized weakness   PT Problem List Decreased strength;Decreased mobility;Decreased range of motion  PT Treatment Interventions Functional mobility training;Patient/family education;Therapeutic activities;Therapeutic exercise;DME instruction;Wheelchair mobility training   PT Goals (Current goals can be found in the Care Plan section) Acute Rehab PT Goals Patient Stated Goal: to return to [facility] when feeling better PT Goal Formulation: With patient Time For Goal Achievement: 07/24/15 Potential to Achieve Goals: Fair    Frequency  Min 2X/week   Barriers to discharge        Co-evaluation               End of Session   Activity Tolerance: Patient tolerated treatment well Patient left: in bed;with call bell/phone within reach;with bed alarm set           Time: 1324-4010 PT Time Calculation (min) (ACUTE ONLY): 19 min   Charges:   PT Evaluation $PT Eval Moderate Complexity: 1 Procedure     PT G Codes:        Rebeca Alert, MPT Pager: [phone]

## 2015-07-11 LAB — BASIC METABOLIC PANEL
ANION GAP: 12 (ref 5–15)
BUN: 47 mg/dL — ABNORMAL HIGH (ref 6–20)
CALCIUM: 9.2 mg/dL (ref 8.9–10.3)
CHLORIDE: 100 mmol/L — AB (ref 101–111)
CO2: 31 mmol/L (ref 22–32)
CREATININE: 1.97 mg/dL — AB (ref 0.44–1.00)
GFR calc non Af Amer: 20 mL/min — ABNORMAL LOW (ref >60)
GFR, EST AFRICAN AMERICAN: 23 mL/min — AB (ref >60)
Glucose, Bld: 105 mg/dL — ABNORMAL HIGH (ref 65–99)
Potassium: 4 mmol/L (ref 3.5–5.1)
SODIUM: 143 mmol/L (ref 135–145)

## 2015-07-11 NOTE — Progress Notes (Signed)
[facility] PROGRESS NOTE  Summer Graham ZOX:[id number] DOB: 1982/02/20 DOA: 07/08/2015  PCP: Ginette Otto, MD  Brief HPI: 82 year old Caucasian female who presented from a skilled nursing facility with the complaint of worsening shortness of breath. Patient was recently hospitalized and treated for healthcare associated pneumonia and was discharged 7 days prior to this hospitalization. Chest x-ray did not show any new infiltrates. Concern was about diastolic congestive heart failure and the patient was hospitalized for further management. Patient is on oxygen at her skilled nursing facility.  Past medical history:  Past Medical History  Diagnosis Date  . Hypertension   . Chronic diastolic CHF (congestive heart failure) (HCC)     a. 03/2015: 2D ECHO: EF 60-65%, mild AS and AI, mildly dilated LA, mild diastolic dysfunction   . Parkinson's disease (HCC)   . COPD (chronic obstructive pulmonary disease) (HCC)     4L O2 baseline  . Heart block AV second degree     a. s/p Biotronik pulse generator: Serial # [id number] was successfully implanted on 04/12/15.   . S/P placement of cardiac pacemaker     a. Biotronik pulse generator: Serial # [id number] was successfully implanted on 04/12/15.   . Iron deficiency anemia     a. placed on iron and colase during 03/2016 admission   . CKD (chronic kidney disease)   . HCAP (healthcare-associated pneumonia) 06/26/2015    Consultants: None  Procedures: None  Antibiotics: None  Subjective: Patient feels well. Denies any complaints. Breathing is improved.   Objective: Vital Signs  Filed Vitals:   07/10/15 2010 07/10/15 2052 07/11/15 0638 07/11/15 0745  BP:  82/44 104/55 90/49  Pulse:  84 83   Temp:  97.8 F (36.6 C) 97.7 F (36.5 C)   TempSrc:  Oral Oral   Resp:  20 18   Height:      Weight:   63.6 kg (140 lb 3.4 oz)   SpO2: 94% 93% 96%     Intake/Output Summary (Last 24 hours) at 07/11/15 0753 Last data filed at 07/10/15  2300  Gross per 24 hour  Intake    610 ml  Output     50 ml  Net    560 ml   Filed Weights   07/09/15 0521 07/10/15 0500 07/11/15 0638  Weight: 64.638 kg (142 lb 8 oz) 65 kg (143 lb 4.8 oz) 63.6 kg (140 lb 3.4 oz)    General appearance: alert, cooperative, appears stated age and no distress Resp: Improved air entry. No crackles heard today. No wheezing.  Cardio: regular rate and rhythm, S1, S2 normal, no murmur, click, rub or gallop GI: soft, non-tender; bowel sounds normal; no masses,  no organomegaly Extremities: extremities normal, atraumatic, no cyanosis or edema Neurologic: She is awake and alert. Oriented to place. No focal neurological deficits.  Lab Results:  Basic Metabolic Panel:  Recent Labs Lab 07/08/15 1018 07/09/15 0506 07/10/15 0514 07/11/15 0536  NA 141 143 139 143  K 4.5 4.0 4.0 4.0  CL 103 100* 96* 100*  CO2 29 33* 31 31  GLUCOSE 109* 95 105* 105*  BUN 22* 21* 38* 47*  CREATININE 0.79 0.85 1.80* 1.97*  CALCIUM 9.0 9.3 8.9 9.2   Liver Function Tests:  Recent Labs Lab 07/08/15 1018  AST 22  ALT 14  ALKPHOS 76  BILITOT 0.6  PROT 6.3*  ALBUMIN 3.6   CBC:  Recent Labs Lab 07/08/15 1018  WBC 6.4  NEUTROABS 4.7  HGB 10.6*  HCT 34.9*  MCV 95.1  PLT 240   BNP (last 3 results)  Recent Labs  04/08/15 1108 06/25/15 2246 07/08/15 1018  BNP 951.7* 473.5* 327.8*     Studies/Results: No results found.  Medications:  Scheduled: . aspirin EC  81 mg Oral Daily  . cholecalciferol  2,000 Units Oral Daily  . citalopram  20 mg Oral Daily  . docusate sodium  100 mg Oral BID  . enoxaparin (LOVENOX) injection  30 mg Subcutaneous Q24H  . ferrous sulfate  325 mg Oral Daily  . guaiFENesin  600 mg Oral BID  . ipratropium-albuterol  3 mL Nebulization BID  . lactobacillus  1 g Oral TID WC  . levothyroxine  88 mcg Oral QAC breakfast  . LORazepam  0.25 mg Oral QHS  . metoprolol  50 mg Oral BID  . multivitamin with minerals  1 tablet Oral Daily    . pantoprazole  40 mg Oral Daily  . sodium chloride flush  3 mL Intravenous Q12H  . vitamin B-12  1,000 mcg Oral Daily  . vitamin C  500 mg Oral Daily   Continuous:  UJW:JXBJYN chloride, acetaminophen, albuterol, ondansetron (ZOFRAN) IV, sodium chloride flush  Assessment/Plan:  Principal Problem:   Diastolic CHF, acute on chronic (HCC) Active Problems:   Essential hypertension   Iron deficiency anemia   S/P placement of cardiac pacemaker   Hypothyroidism   Acute CHF (congestive heart failure) (HCC)    Acute on chronic diastolic congestive heart failure Patient has improved with intravenous Lasix. Creatinine was noted to be significantly higher yesterday. Most likely as a result of Lasix. Hold Lasix and her ARB. Ins and outs not being charted accurately. Continue to mobilize the patient. Continue beta blocker. Continue aspirin. Echocardiogram was done in 2016 and showed EF of 60-65% with grade 1 diastolic dysfunction.  Acute renal failure Creatinine is higher today compared to yesterday, but the rate of increase seems to have slowed down. Continue to monitor for now. This increase is most likely secondary to Lasix. Continue to hold Lasix and ARB. Repeat blood work tomorrow morning. Monitor urine output. Continue to hold other nephrotoxic medications.   History of essential hypertension Blood pressure is borderline low. Stop her amlodipine for now.   Iron deficiency anemia. Continue iron supplementation.  Hypothyroidism. Continue Synthroid.  History of cardiac pacemaker placement. She has a paced rhythm. Continue to monitor.  History of COPD with chronic respiratory failure on home oxygen Stable. Continue with her oxygen.  DVT Prophylaxis: Lovenox    Code Status: DO NOT RESUSCITATE  Family Communication: Discussed with the patient.  Discussed with her son, Dannielle Huh today. Disposition Plan: Mobilize. PT and OT evaluation. Anticipate discharge back to SNF once creatinine is  stable.    LOS: 2 days   [facility]  [facility] Pager [phone] 07/11/2015, 7:53 AM  If 7PM-7AM, please contact night-coverage at [email or web address], password [facility]

## 2015-07-11 NOTE — NC FL2 (Signed)
[place] MEDICAID FL2 LEVEL OF CARE SCREENING TOOL     IDENTIFICATION  Patient Name: Summer Graham Birthdate: [birth year] Sex: female Admission Date (Current Location): 07/08/2015  [facility] and [place] Number:  Producer, television/film/video and Address:  [facility],  [address] [place], [place] [address]      Provider Number: [id number]  Attending Physician Name and Address:  Osvaldo Shipper, MD  Relative Name and Phone Number:  Tessah Patchen [phone])    Current Level of Care: Hospital Recommended Level of Care: Skilled Nursing Facility Prior Approval Number:    Date Approved/Denied:   PASRR Number: [id number]  Discharge Plan: SNF    Current Diagnoses: Patient Active Problem List   Diagnosis Date Noted  . Acute CHF (congestive heart failure) (HCC) 07/09/2015  . Diastolic CHF, acute on chronic (HCC) 07/08/2015  . Hypothyroidism 07/08/2015  . S/P placement of cardiac pacemaker   . CKD (chronic kidney disease)   . Essential hypertension 04/10/2015  . Iron deficiency anemia 04/10/2015  . Heart block AV second degree 04/08/2015    Orientation RESPIRATION BLADDER Height & Weight     Self, Time, Situation, Place  O2 (4L) Continent Weight: 63.6 kg (140 lb 3.4 oz) Height:   (160 cm)  BEHAVIORAL SYMPTOMS/MOOD NEUROLOGICAL BOWEL NUTRITION STATUS   (NONE)  (NONE) Continent Diet (2 gram sodium)  AMBULATORY STATUS COMMUNICATION OF NEEDS Skin   Extensive Assist Verbally Other (Comment) (small red open area left poterior thigh)                       Personal Care Assistance Level of Assistance  Bathing, Feeding, Dressing Bathing Assistance: Limited assistance Feeding assistance: Independent Dressing Assistance: Limited assistance     Functional Limitations Info  Hearing   Hearing Info: Impaired      SPECIAL CARE FACTORS FREQUENCY  PT (By licensed PT), OT (By licensed OT)     PT Frequency: 5 x a week OT Frequency: 5 x a week             Contractures Contractures Info: Not present    Additional Factors Info  Psychotropic Code Status Info: DNR code status Allergies Info: No Known Allergies Psychotropic Info: Celexa, ativan         Current Medications (07/11/2015):  This is the current hospital active medication list Current Facility-Administered Medications  Medication Dose Route Frequency Provider Last Rate Last Dose  . 0.9 %  sodium chloride infusion  250 mL Intravenous PRN Maryruth Bun Rama, MD      . acetaminophen (TYLENOL) tablet 650 mg  650 mg Oral Q4H PRN Christina P Rama, MD      . albuterol (PROVENTIL) (2.5 MG/3ML) 0.083% nebulizer solution 2.5 mg  2.5 mg Nebulization Q6H PRN Maryruth Bun Rama, MD      . aspirin EC tablet 81 mg  81 mg Oral Daily Maryruth Bun Rama, MD   81 mg at 07/11/15 0742  . cholecalciferol (VITAMIN D) tablet 2,000 Units  2,000 Units Oral Daily Maryruth Bun Rama, MD   2,000 Units at 07/11/15 [phone]  . citalopram (CELEXA) tablet 20 mg  20 mg Oral Daily Maryruth Bun Rama, MD   20 mg at 07/11/15 0743  . docusate sodium (COLACE) capsule 100 mg  100 mg Oral BID Maryruth Bun Rama, MD   100 mg at 07/11/15 0743  . enoxaparin (LOVENOX) injection 30 mg  30 mg Subcutaneous Q24H Osvaldo Shipper, MD   30 mg at 07/10/15 1723  .  ferrous sulfate tablet 325 mg  325 mg Oral Daily Maryruth Bun Rama, MD   325 mg at 07/11/15 0742  . guaiFENesin (MUCINEX) 12 hr tablet 600 mg  600 mg Oral BID Maryruth Bun Rama, MD   600 mg at 07/11/15 0742  . ipratropium-albuterol (DUONEB) 0.5-2.5 (3) MG/3ML nebulizer solution 3 mL  3 mL Nebulization BID Maryruth Bun Rama, MD   3 mL at 07/11/15 0938  . lactobacillus (FLORANEX/LACTINEX) granules 1 g  1 g Oral TID WC Maryruth Bun Rama, MD   1 g at 07/11/15 0742  . levothyroxine (SYNTHROID, LEVOTHROID) tablet 88 mcg  88 mcg Oral QAC breakfast Maryruth Bun Rama, MD   88 mcg at 07/11/15 0743  . LORazepam (ATIVAN) tablet 0.25 mg  0.25 mg Oral QHS Maryruth Bun Rama, MD   0.25 mg at 07/10/15 2152  .  metoprolol (LOPRESSOR) tablet 50 mg  50 mg Oral BID Maryruth Bun Rama, MD   50 mg at 07/10/15 0943  . multivitamin with minerals tablet 1 tablet  1 tablet Oral Daily Maryruth Bun Rama, MD   1 tablet at 07/11/15 0742  . ondansetron (ZOFRAN) injection 4 mg  4 mg Intravenous Q6H PRN Christina P Rama, MD      . pantoprazole (PROTONIX) EC tablet 40 mg  40 mg Oral Daily Maryruth Bun Rama, MD   40 mg at 07/11/15 0743  . sodium chloride flush (NS) 0.9 % injection 3 mL  3 mL Intravenous Q12H Maryruth Bun Rama, MD   3 mL at 07/11/15 1000  . sodium chloride flush (NS) 0.9 % injection 3 mL  3 mL Intravenous PRN Maryruth Bun Rama, MD   3 mL at 07/09/15 1118  . vitamin B-12 (CYANOCOBALAMIN) tablet 1,000 mcg  1,000 mcg Oral Daily Maryruth Bun Rama, MD   1,000 mcg at 07/11/15 0743  . vitamin C (ASCORBIC ACID) tablet 500 mg  500 mg Oral Daily Maryruth Bun Rama, MD   500 mg at 07/11/15 9604     Discharge Medications: Please see discharge summary for a list of discharge medications.  Relevant Imaging Results:  Relevant Lab Results:   Additional Information SS# [id number]  Venita Lick, LCSW

## 2015-07-11 NOTE — Clinical Social Work Note (Signed)
Clinical Social Work Assessment  Patient Details  Name: Summer Graham MRN: [id number] Date of Birth: 13-Aug-1915  Date of referral:  07/11/15               Reason for consult:  Discharge Planning, Facility Placement                Permission sought to share information with:  Facility Sport and exercise psychologist, Family Supports Permission granted to share information::  Yes, Verbal Permission Granted  Name::     Ephraim Hamburger  Agency::  [employer]  Relationship::     Contact Information:     Housing/Transportation Living arrangements for the past 2 months:  [place] of Information:  Patient Patient Interpreter Needed:  None Criminal Activity/Legal Involvement Pertinent to Current Situation/Hospitalization:  No - Comment as needed Significant Relationships:  Adult Children Lives with:  Facility Resident Do you feel safe going back to the place where you live?  Yes Need for family participation in patient care:  Yes (Comment)  Care giving concerns:  Patient agrees with recommendation for return to SNF.   Social Worker assessment / plan:  CSW met with patient at bedside to complete assessment. Patient appears to be in good spirits. She confirms that she was admitted from [facility] where she is a resident. The patient states that she plans to return to the facility at discharge and is happy with the care she receives from the facility. CSW explained CSW role and process of getting the patient back to the facility once medically stable. Patient would like son Ladina Shutters included in discharge planning.  Employment status:  Retired Forensic scientist:  Medicare PT Recommendations:  [place] / Referral to community resources:  [place]  Patient/Family's Response to care:  Patient appears to be happy with the care she has received here at [facility].  Patient/Family's Understanding of and Emotional Response to Diagnosis,  Current Treatment, and Prognosis:  The patient has good insight into reason for admission and her post DC needs.  Emotional Assessment Appearance:  Appears stated age Attitude/Demeanor/Rapport:  Other (Patient is appropriate and welcoming of CSW.) Affect (typically observed):  Appropriate, Calm, Accepting, Pleasant Orientation:  Oriented to Self, Oriented to Place, Oriented to  Time, Oriented to Situation Alcohol / Substance use:  Never Used Psych involvement (Current and /or in the community):  No (Comment)  Discharge Needs  Concerns to be addressed:  Discharge Planning Concerns Readmission within the last 30 days:  Yes Current discharge risk:  Chronically ill, Physical Impairment Barriers to Discharge:  Continued Medical Work up   [place], LCSW 07/11/2015, 1:13 PM

## 2015-07-12 LAB — CBC
HCT: 32 % — ABNORMAL LOW (ref 36.0–46.0)
HEMOGLOBIN: 10.2 g/dL — AB (ref 12.0–15.0)
MCH: 29.6 pg (ref 26.0–34.0)
MCHC: 31.9 g/dL (ref 30.0–36.0)
MCV: 92.8 fL (ref 78.0–100.0)
PLATELETS: 229 10*3/uL (ref 150–400)
RBC: 3.45 MIL/uL — ABNORMAL LOW (ref 3.87–5.11)
RDW: 17.8 % — ABNORMAL HIGH (ref 11.5–15.5)
WBC: 5.6 10*3/uL (ref 4.0–10.5)

## 2015-07-12 LAB — BASIC METABOLIC PANEL
ANION GAP: 9 (ref 5–15)
BUN: 48 mg/dL — ABNORMAL HIGH (ref 6–20)
CHLORIDE: 99 mmol/L — AB (ref 101–111)
CO2: 31 mmol/L (ref 22–32)
Calcium: 9.1 mg/dL (ref 8.9–10.3)
Creatinine, Ser: 1.61 mg/dL — ABNORMAL HIGH (ref 0.44–1.00)
GFR calc Af Amer: 30 mL/min — ABNORMAL LOW (ref >60)
GFR, EST NON AFRICAN AMERICAN: 26 mL/min — AB (ref >60)
GLUCOSE: 101 mg/dL — AB (ref 65–99)
POTASSIUM: 3.8 mmol/L (ref 3.5–5.1)
SODIUM: 139 mmol/L (ref 135–145)

## 2015-07-12 MED ORDER — HYDROCODONE-ACETAMINOPHEN 5-325 MG PO TABS: 1.0000 | ORAL_TABLET | Freq: Three times a day (TID) | ORAL | Status: DC | PRN

## 2015-07-12 MED ORDER — CELECOXIB 100 MG PO CAPS: 100.0000 mg | ORAL_CAPSULE | Freq: Every day | ORAL | Status: DC

## 2015-07-12 MED ORDER — FUROSEMIDE 20 MG PO TABS: 20.0000 mg | ORAL_TABLET | Freq: Every day | ORAL | Status: DC

## 2015-07-12 MED ORDER — LORAZEPAM 0.5 MG PO TABS: ORAL_TABLET | ORAL | Status: DC

## 2015-07-12 NOTE — Progress Notes (Signed)
Called report to Kandace Parkins at [facility], awaiting transport.  Lenox Ponds, RN

## 2015-07-12 NOTE — Discharge Instructions (Signed)

## 2015-07-12 NOTE — Progress Notes (Signed)
Pt for discharge back to [facility] and [facility].   CSW facilitated pt discharge needs including contacting facility, faxing pt discharge information via [employer], discussing with pt at bedside and notifying pt sons, Dannielle Huh and [place] via telephone, providing RN phone number to call report, and arranging ambulance transport for pt to [facility].   No further social work needs identified at this time.  CSW signing off.   Loletta Specter, MSW, LCSW Clinical Social Work [phone]

## 2015-07-12 NOTE — Clinical Documentation Improvement (Signed)
Hospitalist  Can the diagnosis of CKD be further specified?   CKD Stage I - GFR greater than or equal to 90  CKD Stage II - GFR 60-89  CKD Stage III - GFR 30-59  CKD Stage IV - GFR 15-29  CKD Stage V - GFR < 15  ESRD (End Stage Renal Disease)  Other condition  Unable to clinically determine   Supporting Information: : (risk factors, signs and symptoms, diagnostics, treatment) States in medical renal that pt has CKD.  Admission GFR was >60.  After IV lasix GFR went to 22, 20 and 26.  Please exercise your independent, professional judgment when responding. A specific answer is not anticipated or expected.   Thank Modesta Messing [facility] Health Information Management [place] [phone]

## 2015-07-12 NOTE — Discharge Summary (Signed)
[facility]  Physician Discharge Summary   Patient ID: Summer Graham MRN: [id number] DOB/AGE: [birth year] 82 y.o.  Admit date: 07/08/2015 Discharge date: 07/12/2015  PCP: Ginette Otto, MD  DISCHARGE DIAGNOSES:  Principal Problem:   Diastolic CHF, acute on chronic [facility]) Active Problems:   Essential hypertension   Iron deficiency anemia   S/P placement of cardiac pacemaker   Hypothyroidism   Acute CHF (congestive heart failure) (HCC)   RECOMMENDATIONS FOR OUTPATIENT FOLLOW UP: Please resume Lasix on 07/14/15.  Please check BMET on 07/15/15 to check renal funcion.  Resume Valsartan if renal function is back to baseline.  Okay to resume Celebrex if her renal function has returned to baseline. Order palliative medicine consult at SNF for Goals of care.   DISCHARGE CONDITION: fair  Diet recommendation: Heart healthy  Filed Weights   07/09/15 0521 07/10/15 0500 07/11/15 9562  Weight: 64.638 kg (142 lb 8 oz) 65 kg (143 lb 4.8 oz) 63.6 kg (140 lb 3.4 oz)    INITIAL HISTORY: 82 year old Caucasian female who presented from a skilled nursing facility with the complaint of worsening shortness of breath. Patient was recently hospitalized and treated for healthcare associated pneumonia and was discharged 7 days prior to this hospitalization. Chest x-ray did not show any new infiltrates. Concern was about diastolic congestive heart failure and the patient was hospitalized for further management. Patient is on oxygen at her skilled nursing facility.  Consultations:  None  Procedures:  None  HOSPITAL COURSE:    Acute on chronic diastolic congestive heart failure Patient was admitted with worsening shortness of breath and was thought to have acute diastolic congestive heart failure. She was given intravenous Lasix. She improved. Unfortunately ins and outs have not been charted accurately. However, patient is symptomatically better. Her weight has reduced. Lasix did cause  a rise in her creatinine, but that is improving. Lasix can be resumed at her usual dose in about 2 days time. Renal function will need to be monitored. Echocardiogram was done in 2016 and showed EF of 60-65% with grade 1 diastolic dysfunction.  Acute renal failure Patient went into acute renal failure with elevated BUN and creatinine. This was most likely secondary to her Lasix. Lasix was held. Creatinine has trended down this morning. Should continue to improve. Plan will be to repeat a basic metabolic panel in a few days' time at the skilled nursing facility. Patient continues to make urine. There is no evidence for chronic kidney disease based on previous blood work. Her ARB will be held and can be resumed if her renal function returns to baseline. Celebrex is also being held for now.   History of essential hypertension Blood pressure was noted to be borderline low. Amlodipine has been discontinued. Blood pressures have improved. Continue to hold her amlodipine. As BP becomes hypertensive, start off by reinitiating her ARB when her renal function is back to baseline.   Iron deficiency anemia. Continue iron supplementation.  Hypothyroidism. Continue Synthroid.  History of cardiac pacemaker placement. She has a paced rhythm.   History of COPD with chronic respiratory failure on home oxygen Stable. Continue with her oxygen.  Overall improved. Discussed in detail with the patient regarding her CODE STATUS. She agrees to continue with DO NOT RESUSCITATE for now. Discussed also with the patient's son, [name] and updated him on patient's condition. Discussed the palliative medicine consult at her skilled nursing facility and he agrees with same. Okay for discharge today.  PERTINENT LABS:  The results of significant  diagnostics from this hospitalization (including imaging, microbiology, ancillary and laboratory) are listed below for reference.     Labs: Basic Metabolic Panel:  Recent  Labs Lab 07/08/15 1018 07/09/15 0506 07/10/15 0514 07/11/15 0536 07/12/15 0538  NA 141 143 139 143 139  K 4.5 4.0 4.0 4.0 3.8  CL 103 100* 96* 100* 99*  CO2 29 33* GLUCOSE 109* 95 105* 105* 101*  BUN 22* 21* 38* 47* 48*  CREATININE 0.79 0.85 1.80* 1.97* 1.61*  CALCIUM 9.0 9.3 8.9 9.2 9.1   Liver Function Tests:  Recent Labs Lab 07/08/15 1018  AST 22  ALT 14  ALKPHOS 76  BILITOT 0.6  PROT 6.3*  ALBUMIN 3.6   CBC:  Recent Labs Lab 07/08/15 1018 07/12/15 0538  WBC 6.4 5.6  NEUTROABS 4.7  --   HGB 10.6* 10.2*  HCT 34.9* 32.0*  MCV 95.1 92.8  PLT 240 229   BNP: BNP (last 3 results)  Recent Labs  04/08/15 1108 06/25/15 2246 07/08/15 1018  BNP 951.7* 473.5* 327.8*    IMAGING STUDIES Dg Chest 2 View  07/08/2015  CLINICAL DATA:  Shortness of breath.  Cough. EXAM: CHEST  2 VIEW COMPARISON:  06/30/2015 FINDINGS: Pacer with leads at right atrium and right ventricle. No lead discontinuity. Midline trachea. Cardiomegaly accentuated by AP portable technique. Probable small left pleural effusion. No pneumothorax. Extremely low lung volumes with moderate right hemidiaphragm elevation. Bibasilar opacities are similar and favored to represent areas of atelectasis and scarring. No definite lobar consolidation. No overt congestive failure. IMPRESSION: Cardiomegaly with probable small left pleural effusion. Similar bibasilar volume loss with scarring and atelectasis. No definite superimposed process. Electronically Signed   By: Jeronimo Greaves M.D.   On: 07/08/2015 11:03     DISCHARGE EXAMINATION: Filed Vitals:   07/11/15 2119 07/12/15 0440 07/12/15 0828 07/12/15 0942  BP: 109/54 134/65 118/64   Pulse: 91 79    Temp: 98.4 F (36.9 C) 97.8 F (36.6 C)    TempSrc: Oral Oral    Resp: 18 18    Height:      Weight:      SpO2: 92% 95%  94%   General appearance: alert, cooperative, appears stated age and no distress Resp: clear to auscultation bilaterally Cardio:  regular rate and rhythm, S1, S2 normal, no murmur, click, rub or gallop GI: soft, non-tender; bowel sounds normal; no masses,  no organomegaly Extremities: extremities normal, atraumatic, no cyanosis or edema  DISPOSITION: SNF  Discharge Instructions    Call MD for:  difficulty breathing, headache or visual disturbances    Complete by:  As directed      Call MD for:  extreme fatigue    Complete by:  As directed      Call MD for:  persistant dizziness or light-headedness    Complete by:  As directed      Call MD for:  persistant nausea and vomiting    Complete by:  As directed      Call MD for:  redness, tenderness, or signs of infection (pain, swelling, redness, odor or green/yellow discharge around incision site)    Complete by:  As directed      Call MD for:  severe uncontrolled pain    Complete by:  As directed      Call MD for:  temperature >100.4    Complete by:  As directed      Discharge instructions    Complete by:  As directed  Please resume Lasix on 07/14/15. Please check BMET on 07/15/15 to check renal funcion. Resume Valsartan if renal function is back to baseline. Consider palliative medicine consult at SNF for Goals of care.   You were cared for by a hospitalist during your hospital stay. If you have any questions about your discharge medications or the care you received while you were in the hospital after you are discharged, you can call the unit and asked to speak with the hospitalist on call if the hospitalist that took care of you is not available. Once you are discharged, your primary care physician will handle any further medical issues. Please note that NO REFILLS for any discharge medications will be authorized once you are discharged, as it is imperative that you return to your primary care physician (or establish a relationship with a primary care physician if you do not have one) for your aftercare needs so that they can reassess your need for medications and monitor  your lab values. If you do not have a primary care physician, you can call [phone] for a physician referral.     Increase activity slowly    Complete by:  As directed            ALLERGIES: No Known Allergies   Current Discharge Medication List    CONTINUE these medications which have CHANGED   Details  celecoxib (CELEBREX) 100 MG capsule Take 1 capsule (100 mg total) by mouth daily. Ok to resume after 5 days.    furosemide (LASIX) 20 MG tablet Take 1 tablet (20 mg total) by mouth daily. PLEASE RESUME ON 07/14/15 Qty: 30 tablet    HYDROcodone-acetaminophen (NORCO/VICODIN) 5-325 MG tablet Take 1 tablet by mouth 3 (three) times daily as needed for moderate pain. Qty: 30 tablet, Refills: 0    LORazepam (ATIVAN) 0.5 MG tablet Take HALF tablet by mouth, at bedtime, as needed for sleep. Qty: 15 tablet, Refills: 0      CONTINUE these medications which have NOT CHANGED   Details  acetaminophen (TYLENOL) 500 MG tablet Take 500 mg by mouth every 6 (six) hours as needed for mild pain.    albuterol (PROVENTIL HFA;VENTOLIN HFA) 108 (90 Base) MCG/ACT inhaler Inhale 2 puffs into the lungs every 6 (six) hours as needed for wheezing or shortness of breath. Qty: 1 Inhaler, Refills: 2    albuterol (PROVENTIL) (2.5 MG/3ML) 0.083% nebulizer solution Take 3 mLs (2.5 mg total) by nebulization every 6 (six) hours as needed for wheezing or shortness of breath. Qty: 75 mL, Refills: 12    aspirin EC 81 MG tablet Take 81 mg by mouth daily.    Cholecalciferol (VITAMIN D3 SUPER STRENGTH) 2000 UNITS CAPS Take 2,000 Units by mouth daily.    citalopram (CELEXA) 20 MG tablet Take 20 mg by mouth daily.     docusate sodium (COLACE) 100 MG capsule Take 100 mg by mouth 2 (two) times daily.    ferrous sulfate 325 (65 FE) MG tablet Take 325 mg by mouth daily.    guaiFENesin (MUCINEX) 600 MG 12 hr tablet Take 1 tablet (600 mg total) by mouth 2 (two) times daily. Please take for 2 weeks then stop Qty: 30  tablet, Refills: 0    ipratropium-albuterol (DUONEB) 0.5-2.5 (3) MG/3ML SOLN Take 3 mLs by nebulization 2 (two) times daily. Qty: 360 mL    lactobacillus (FLORANEX/LACTINEX) PACK Take 1 packet (1 g total) by mouth 3 (three) times daily with meals. Qty: 30 each, Refills: 0  levothyroxine (SYNTHROID, LEVOTHROID) 88 MCG tablet Take 88 mcg by mouth daily before breakfast.    metoprolol (LOPRESSOR) 50 MG tablet Take 1 tablet (50 mg total) by mouth 2 (two) times daily. Qty: 60 tablet, Refills: 6    Multiple Vitamin (MULTIVITAMIN WITH MINERALS) TABS tablet Take 1 tablet by mouth daily.    omeprazole (PRILOSEC) 20 MG capsule Take 20 mg by mouth daily.    vitamin B-12 (CYANOCOBALAMIN) 1000 MCG tablet Take 1,000 mcg by mouth daily.    vitamin C (ASCORBIC ACID) 500 MG tablet Take 500 mg by mouth daily.      STOP taking these medications     amLODipine (NORVASC) 5 MG tablet      valsartan (DIOVAN) 160 MG tablet        Follow-up Information    Follow up with Ginette Otto, MD. Schedule an appointment as soon as possible for a visit in 1 week.   Specialty:  Internal Medicine   Why:  post hospitalization follow up   Contact information:   [address] [place] [address] [phone]       TOTAL DISCHARGE TIME: 35 mins  [facility]  [facility] Pager [phone]  07/12/2015, 10:20 AM

## 2015-07-18 NOTE — Progress Notes (Signed)
Electrophysiology Office Note   Date:  07/19/2015   ID:  Charmagne Buhl, DOB 1916/01/21, MRN [id number]  PCP:  Ginette Otto, MD  Electrophysiologist:  Regan Lemming, MD    Chief Complaint  Patient presents with  . Pacemaker Check     History of Present Illness: Summer Graham is a 82 y.o. female who presents today for electrophysiology evaluation.   She has a history of hypertension, COPD on oxygen, Parkinson's, and CHF. She was admitted to the hospital on 04/08/15 where she had a dual chamber pacemaker placed for 2 to one AV block. Since that time, she has had 2 subsequent admissions for HCAP and diastolic heart failure.today she comes into clinic without any major complaints. She says that she is felt well since being in the hospital, although she has been weak and tired.   Today, she denies symptoms of palpitations, chest pain, shortness of breath, orthopnea, PND, lower extremity edema, claudication, dizziness, presyncope, syncope, bleeding, or neurologic sequela. The patient is tolerating medications without difficulties and is otherwise without complaint today.    Past Medical History  Diagnosis Date  . Hypertension   . Chronic diastolic CHF (congestive heart failure) (HCC)     a. 03/2015: 2D ECHO: EF 60-65%, mild AS and AI, mildly dilated LA, mild diastolic dysfunction   . Parkinson's disease (HCC)   . COPD (chronic obstructive pulmonary disease) (HCC)     4L O2 baseline  . Heart block AV second degree     a. s/p Biotronik pulse generator: Serial # [id number] was successfully implanted on 04/12/15.   . S/P placement of cardiac pacemaker     a. Biotronik pulse generator: Serial # [id number] was successfully implanted on 04/12/15.   . Iron deficiency anemia     a. placed on iron and colase during 03/2016 admission   . CKD (chronic kidney disease)   . HCAP (healthcare-associated pneumonia) 06/26/2015   Past Surgical History  Procedure Laterality Date  .  Appendectomy      At age 50  . Total abdominal hysterectomy      Age 60  . Ep implantable device N/A 04/12/2015    Procedure: Pacemaker Implant;  Surgeon: Duke Salvia, MD;  Location: [facility] INVASIVE CV LAB;  Service: Cardiovascular;  Laterality: N/A;     Current Outpatient Prescriptions  Medication Sig Dispense Refill  . acetaminophen (TYLENOL) 500 MG tablet Take 500 mg by mouth every 6 (six) hours as needed for mild pain.    Marland Kitchen albuterol (PROVENTIL HFA;VENTOLIN HFA) 108 (90 Base) MCG/ACT inhaler Inhale 2 puffs into the lungs every 6 (six) hours as needed for wheezing or shortness of breath. 1 Inhaler 2  . albuterol (PROVENTIL) (2.5 MG/3ML) 0.083% nebulizer solution Take 3 mLs (2.5 mg total) by nebulization every 6 (six) hours as needed for wheezing or shortness of breath. 75 mL 12  . aspirin EC 81 MG tablet Take 81 mg by mouth daily.    . Cholecalciferol (VITAMIN D3 SUPER STRENGTH) 2000 UNITS CAPS Take 2,000 Units by mouth daily.    . citalopram (CELEXA) 20 MG tablet Take 20 mg by mouth daily.     Marland Kitchen docusate sodium (COLACE) 100 MG capsule Take 100 mg by mouth 2 (two) times daily.    . ferrous sulfate 325 (65 FE) MG tablet Take 325 mg by mouth daily.    . furosemide (LASIX) 20 MG tablet Take 1 tablet (20 mg total) by mouth daily. PLEASE RESUME ON 07/14/15 30 tablet   .  guaiFENesin (MUCINEX) 600 MG 12 hr tablet Take 1 tablet (600 mg total) by mouth 2 (two) times daily. Please take for 2 weeks then stop 30 tablet 0  . HYDROcodone-acetaminophen (NORCO/VICODIN) 5-325 MG tablet Take 1 tablet by mouth 3 (three) times daily as needed for moderate pain. 30 tablet 0  . ipratropium-albuterol (DUONEB) 0.5-2.5 (3) MG/3ML SOLN Take 3 mLs by nebulization 2 (two) times daily. 360 mL   . lactobacillus (FLORANEX/LACTINEX) PACK Take 1 packet (1 g total) by mouth 3 (three) times daily with meals. (Patient taking differently: Take 1 g by mouth 3 (three) times daily with meals. Started 02/04) 30 each 0  .  levothyroxine (SYNTHROID, LEVOTHROID) 88 MCG tablet Take 88 mcg by mouth daily before breakfast.    . LORazepam (ATIVAN) 0.5 MG tablet Take HALF tablet by mouth, at bedtime, as needed for sleep. 15 tablet 0  . metoprolol (LOPRESSOR) 50 MG tablet Take 1 tablet (50 mg total) by mouth 2 (two) times daily. 60 tablet 6  . Multiple Vitamin (MULTIVITAMIN WITH MINERALS) TABS tablet Take 1 tablet by mouth daily.    Marland Kitchen omeprazole (PRILOSEC) 20 MG capsule Take 20 mg by mouth daily.    . vitamin B-12 (CYANOCOBALAMIN) 1000 MCG tablet Take 1,000 mcg by mouth daily.    . vitamin C (ASCORBIC ACID) 500 MG tablet Take 500 mg by mouth daily.     No current facility-administered medications for this visit.    Allergies:   Review of patient's allergies indicates no known allergies.   Social History:  The patient  reports that she has never smoked. She does not have any smokeless tobacco history on file. She reports that she does not drink alcohol or use illicit drugs.   Family History:  The patient's family history includes Hypertension in her father and mother. There is no history of Heart attack or Stroke.    ROS:  Please see the history of present illness.   Otherwise, review of systems is positive for cough, depression.   All other systems are reviewed and negative.    PHYSICAL EXAM: VS:  BP 110/70 mmHg  Pulse 76  Ht  (1.6 m)  Wt 142 lb (64.411 kg)  BMI 25.16 kg/m2 , BMI Body mass index is 25.16 kg/(m^2). GEN: Well nourished, well developed, in no acute distress HEENT: normal Neck: no JVD, carotid bruits, or masses Cardiac: RRR; no murmurs, rubs, or gallops,no edema  Respiratory:  clear to auscultation bilaterally, normal work of breathing GI: soft, nontender, nondistended, + BS MS: no deformity or atrophy Skin: warm and dry,  Device pocket well healed, Rash under left breast Neuro:  Strength and sensation are intact Psych: euthymic mood, full affect  EKG:  EKG is ordered today. The ekg  ordered today shows V paced   Device interrogation is reviewed today in detail.  See PaceArt for details.   Recent Labs: 06/29/2015: TSH 2.917 06/30/2015: Magnesium 1.7 07/08/2015: ALT 14; B Natriuretic Peptide 327.8* 07/12/2015: BUN 48*; Creatinine, Ser 1.61*; Hemoglobin 10.2*; Platelets 229; Potassium 3.8; Sodium 139    Lipid Panel  No results found for: CHOL, TRIG, HDL, CHOLHDL, VLDL, LDLCALC, LDLDIRECT   Wt Readings from Last 3 Encounters:  07/19/15 142 lb (64.411 kg)  07/11/15 140 lb 3.4 oz (63.6 kg)  06/26/15 138 lb 6.4 oz (62.778 kg)      Other studies Reviewed: Additional studies/ records that were reviewed today include: TTE 11/16  Review of the above records today demonstrates:  - Left  ventricle: The cavity size was normal. Wall thickness was normal. Systolic function was normal. The estimated ejection fraction was in the range of 60% to 65%. Wall motion was normal; there were no regional wall motion abnormalities. Doppler parameters are consistent with abnormal left ventricular relaxation (grade 1 diastolic dysfunction). - Aortic valve: There was very mild stenosis. There was mild regurgitation. - Left atrium: The atrium was mildly dilated.   ASSESSMENT AND PLAN:  1.  2:1 AV block: Status post dual-chamber pacemaker. Pacemaker functioning appropriately without any high rate episodes.no major changes made today. Pandora Mccrackin follow-up in 9 months.  2. Rash: possibly due to yeast under her left breast. Have sent a note to her facility to have her see the physician there to potentially be treated.  Current medicines are reviewed at length with the patient today.   The patient does not have concerns regarding her medicines.  The following changes were made today:  none  Labs/ tests ordered today include:  Orders Placed This Encounter  Procedures  . EKG 12-Lead     Disposition:   FU with Zelina Jimerson 9 months  Signed, Malayiah Mcbrayer Jorja Loa, MD  07/19/2015  12:34 PM     [facility] [address] [place] [address] [phone] (office) [phone] (fax)

## 2015-07-19 ENCOUNTER — Encounter: Payer: Self-pay | Admitting: Cardiology

## 2015-07-19 ENCOUNTER — Ambulatory Visit (INDEPENDENT_AMBULATORY_CARE_PROVIDER_SITE_OTHER): Payer: Medicare Other | Admitting: Cardiology

## 2015-07-19 VITALS — BP 110/70 | HR 76 | Ht 63.0 in | Wt 142.0 lb

## 2015-07-19 DIAGNOSIS — R001 Bradycardia, unspecified: Secondary | ICD-10-CM

## 2015-07-19 DIAGNOSIS — I441 Atrioventricular block, second degree: Secondary | ICD-10-CM

## 2015-07-19 DIAGNOSIS — Z959 Presence of cardiac and vascular implant and graft, unspecified: Secondary | ICD-10-CM

## 2015-07-19 LAB — CUP PACEART INCLINIC DEVICE CHECK
Brady Statistic RA Percent Paced: 39 %
Implantable Lead Implant Date: 20161114
Implantable Lead Location: 753859
Implantable Lead Model: 1948
Implantable Lead Model: 5076
Lead Channel Impedance Value: 468 Ohm
Lead Channel Impedance Value: 682 Ohm
Lead Channel Pacing Threshold Amplitude: 0.8 V
Lead Channel Pacing Threshold Pulse Width: 0.4 ms
Lead Channel Sensing Intrinsic Amplitude: 12.2 mV
Lead Channel Sensing Intrinsic Amplitude: 2.5 mV
Lead Channel Sensing Intrinsic Amplitude: 2.5 mV
Lead Channel Setting Pacing Amplitude: 2.4 V
Lead Channel Setting Pacing Pulse Width: 0.4 ms
MDC IDC LEAD IMPLANT DT: 20161114
MDC IDC LEAD LOCATION: 753860
MDC IDC MSMT LEADCHNL RA PACING THRESHOLD AMPLITUDE: 1 V
MDC IDC MSMT LEADCHNL RA PACING THRESHOLD AMPLITUDE: 1 V
MDC IDC MSMT LEADCHNL RA PACING THRESHOLD AMPLITUDE: 1 V
MDC IDC MSMT LEADCHNL RA PACING THRESHOLD PULSEWIDTH: 0.4 ms
MDC IDC MSMT LEADCHNL RA PACING THRESHOLD PULSEWIDTH: 0.4 ms
MDC IDC MSMT LEADCHNL RV PACING THRESHOLD AMPLITUDE: 0.6 V
MDC IDC MSMT LEADCHNL RV PACING THRESHOLD AMPLITUDE: 0.8 V
MDC IDC MSMT LEADCHNL RV PACING THRESHOLD PULSEWIDTH: 0.4 ms
MDC IDC MSMT LEADCHNL RV PACING THRESHOLD PULSEWIDTH: 0.4 ms
MDC IDC MSMT LEADCHNL RV PACING THRESHOLD PULSEWIDTH: 0.4 ms
MDC IDC MSMT LEADCHNL RV SENSING INTR AMPL: 10.7 mV
MDC IDC SESS DTM: 20170220130946
MDC IDC SET LEADCHNL RA PACING AMPLITUDE: 2 V
MDC IDC STAT BRADY RV PERCENT PACED: 100 %
Pulse Gen Serial Number: 68612943

## 2015-07-19 NOTE — Patient Instructions (Signed)
Medication Instructions:  Your physician recommends that you continue on your current medications as directed. Please refer to the Current Medication list given to you today.  Labwork: None ordered  Testing/Procedures: None ordered  Follow-Up: Remote monitoring is used to monitor your Pacemaker of ICD from home. This monitoring reduces the number of office visits required to check your device to one time per year. It allows [place] to keep an eye on the functioning of your device to ensure it is working properly. You are scheduled for a device check from home on 10/18/2015. You may send your transmission at any time that day. If you have a wireless device, the transmission will be sent automatically. After your physician reviews your transmission, you will receive a postcard with your next transmission date.  Your physician wants you to follow-up in: 9 months with Dr. Elberta Fortis.  You will receive a reminder letter in the mail two months in advance. If you don't receive a letter, please call our office to schedule the follow-up appointment.  If you need a refill on your cardiac medications before your next appointment, please call your pharmacy.  Thank you for choosing [facility]!!   Dory Horn, RN [phone]

## 2015-08-02 ENCOUNTER — Telehealth: Payer: Self-pay | Admitting: Cardiovascular Disease

## 2015-08-02 NOTE — Telephone Encounter (Signed)
Pt is seeing Dr Summer Graham tomorrow,son is out of town. He would like Dr Allyson SabalBerry to call him after she is seen. He just wants an update on whatt is going on and what is planned for her. If no answer,please leave on his voice mail.

## 2015-08-03 ENCOUNTER — Encounter: Payer: Self-pay | Admitting: Cardiovascular Disease

## 2015-08-03 ENCOUNTER — Ambulatory Visit (INDEPENDENT_AMBULATORY_CARE_PROVIDER_SITE_OTHER): Payer: Medicare Other | Admitting: Cardiovascular Disease

## 2015-08-03 VITALS — BP 114/74 | HR 74 | Ht 63.0 in | Wt 141.0 lb

## 2015-08-03 DIAGNOSIS — I441 Atrioventricular block, second degree: Secondary | ICD-10-CM

## 2015-08-03 DIAGNOSIS — I10 Essential (primary) hypertension: Secondary | ICD-10-CM | POA: Diagnosis not present

## 2015-08-03 DIAGNOSIS — I5033 Acute on chronic diastolic (congestive) heart failure: Secondary | ICD-10-CM

## 2015-08-03 NOTE — Assessment & Plan Note (Signed)
History of hypertension blood pressure measured at 114/74. She is on metoprolol. Continue current meds at current dosing

## 2015-08-03 NOTE — Patient Instructions (Signed)

## 2015-08-03 NOTE — Assessment & Plan Note (Signed)
She was admitted in November with 21 heart block and diastolic heart failure. She was diuresed and had transient renal insufficiency which resolved. She remains on by mouth furosemide. She denies shortness of breath and has no peripheral edema.

## 2015-08-03 NOTE — Progress Notes (Signed)
08/03/2015 Summer Graham   August 04, 1915  [id number]  Primary Physician Ginette Otto, MD Primary Cardiologist: Runell Gess MD Roseanne Reno   HPI:  Summer Graham is a 82 year old frail-appearing widowed Caucasian female mother of 2 children who lives in a nursing home. She was admitted in November with 2: 1 heart block and diastolic heart failure. Her LV function was normal. She had a dual-chamber pacemaker placed by Dr. Graciela Husbands successfully and was followed up as recently as 07/19/15 by Dr. Elberta Fortis in the office demonstrating normal pacer function. She was diuresed while she was in the hospital and remains on by mouth furosemide without evidence of volume overload. She is on chronic home O2.   Current Outpatient Prescriptions  Medication Sig Dispense Refill  . acetaminophen (TYLENOL) 500 MG tablet Take 500 mg by mouth every 6 (six) hours as needed for mild pain.    Marland Kitchen albuterol (PROVENTIL HFA;VENTOLIN HFA) 108 (90 Base) MCG/ACT inhaler Inhale 2 puffs into the lungs every 6 (six) hours as needed for wheezing or shortness of breath. 1 Inhaler 2  . albuterol (PROVENTIL) (2.5 MG/3ML) 0.083% nebulizer solution Take 3 mLs (2.5 mg total) by nebulization every 6 (six) hours as needed for wheezing or shortness of breath. 75 mL 12  . aspirin EC 81 MG tablet Take 81 mg by mouth daily.    . Cholecalciferol (VITAMIN D3 SUPER STRENGTH) 2000 UNITS CAPS Take 2,000 Units by mouth daily.    . citalopram (CELEXA) 20 MG tablet Take 20 mg by mouth daily.     Marland Kitchen docusate sodium (COLACE) 100 MG capsule Take 100 mg by mouth 2 (two) times daily.    . ferrous sulfate 325 (65 FE) MG tablet Take 325 mg by mouth daily.    . furosemide (LASIX) 20 MG tablet Take 1 tablet (20 mg total) by mouth daily. PLEASE RESUME ON 07/14/15 30 tablet   . guaiFENesin (MUCINEX) 600 MG 12 hr tablet Take 1 tablet (600 mg total) by mouth 2 (two) times daily. Please take for 2 weeks then stop 30 tablet 0  .  HYDROcodone-acetaminophen (NORCO/VICODIN) 5-325 MG tablet Take 1 tablet by mouth 3 (three) times daily as needed for moderate pain. 30 tablet 0  . ipratropium-albuterol (DUONEB) 0.5-2.5 (3) MG/3ML SOLN Take 3 mLs by nebulization 2 (two) times daily. 360 mL   . lactobacillus (FLORANEX/LACTINEX) PACK Take 1 packet (1 g total) by mouth 3 (three) times daily with meals. (Patient taking differently: Take 1 g by mouth 3 (three) times daily with meals. Started 02/04) 30 each 0  . levothyroxine (SYNTHROID, LEVOTHROID) 88 MCG tablet Take 88 mcg by mouth daily before breakfast.    . LORazepam (ATIVAN) 0.5 MG tablet Take HALF tablet by mouth, at bedtime, as needed for sleep. 15 tablet 0  . metoprolol (LOPRESSOR) 50 MG tablet Take 1 tablet (50 mg total) by mouth 2 (two) times daily. 60 tablet 6  . Multiple Vitamin (MULTIVITAMIN WITH MINERALS) TABS tablet Take 1 tablet by mouth daily.    Marland Kitchen omeprazole (PRILOSEC) 20 MG capsule Take 20 mg by mouth daily.    . vitamin B-12 (CYANOCOBALAMIN) 1000 MCG tablet Take 1,000 mcg by mouth daily.    . vitamin C (ASCORBIC ACID) 500 MG tablet Take 500 mg by mouth daily.     No current facility-administered medications for this visit.    No Known Allergies  Social History   Social History  . Marital Status: Widowed    Spouse Name: N/A  .  Number of Children: N/A  . Years of Education: N/A   Occupational History  . Not on file.   Social History Main Topics  . Smoking status: Never Smoker   . Smokeless tobacco: Not on file  . Alcohol Use: No  . Drug Use: No  . Sexual Activity: Not on file   Other Topics Concern  . Not on file   Social History Narrative   [facility] [place]. Uses a walker and wheelchair for ambulation.     Review of Systems: General: negative for chills, fever, night sweats or weight changes.  Cardiovascular: negative for chest pain, dyspnea on exertion, edema, orthopnea, palpitations, paroxysmal nocturnal  dyspnea or shortness of breath Dermatological: negative for rash Respiratory: negative for cough or wheezing Urologic: negative for hematuria Abdominal: negative for nausea, vomiting, diarrhea, bright red blood per rectum, melena, or hematemesis Neurologic: negative for visual changes, syncope, or dizziness All other systems reviewed and are otherwise negative except as noted above.    Blood pressure 114/74, pulse 74, height 5\' 3"  (1.6 m), weight 141 lb (63.957 kg).  General appearance: alert and no distress Neck: no adenopathy, no carotid bruit, no JVD, supple, symmetrical, trachea midline and thyroid not enlarged, symmetric, no tenderness/mass/nodules Lungs: clear to auscultation bilaterally Heart: regular rate and rhythm, S1, S2 normal, no murmur, click, rub or gallop Extremities: extremities normal, atraumatic, no cyanosis or edema  EKG not performed today  ASSESSMENT AND PLAN:   Essential hypertension History of hypertension blood pressure measured at 114/74. She is on metoprolol. Continue current meds at current dosing  Heart block AV second degree History of second-degree AV block status post dual-chamber permanent transvenous pacemaker insertion by Dr. Graciela HusbandsKlein 04/12/15. She was recently seen in the office at Dr. Elberta Fortisamnitz on 07/19/15 who demonstrated that her pacemaker was functioning normally.  Diastolic CHF, acute on chronic [facility](HCC) She was admitted in November with 21 heart block and diastolic heart failure. She was diuresed and had transient renal insufficiency which resolved. She remains on by mouth furosemide. She denies shortness of breath and has no peripheral edema.      Runell GessJonathan J. Kloi Brodman MD FACP,FACC,FAHA, [facility] 08/03/2015 11:23 AM

## 2015-08-03 NOTE — Assessment & Plan Note (Signed)
History of second-degree AV block status post dual-chamber permanent transvenous pacemaker insertion by Dr. Graciela HusbandsKlein 04/12/15. She was recently seen in the office at Dr. Elberta Fortisamnitz on 07/19/15 who demonstrated that her pacemaker was functioning normally.

## 2015-08-06 NOTE — Telephone Encounter (Signed)
Spoke with pt son, reviewed AVS. No additional questions at this time.

## 2015-08-22 ENCOUNTER — Encounter (HOSPITAL_COMMUNITY): Payer: Self-pay | Admitting: *Deleted

## 2015-08-22 ENCOUNTER — Emergency Department (HOSPITAL_COMMUNITY): Payer: Medicare Other

## 2015-08-22 ENCOUNTER — Inpatient Hospital Stay (HOSPITAL_COMMUNITY)
Admission: EM | Admit: 2015-08-22 | Discharge: 2015-08-29 | DRG: 291 | Disposition: A | Discharge status: Skilled Nursing Facility | Payer: Medicare Other | Attending: Internal Medicine | Admitting: Internal Medicine

## 2015-08-22 DIAGNOSIS — Z993 Dependence on wheelchair: Secondary | ICD-10-CM | POA: Diagnosis not present

## 2015-08-22 DIAGNOSIS — Z7189 Other specified counseling: Secondary | ICD-10-CM | POA: Diagnosis not present

## 2015-08-22 DIAGNOSIS — I10 Essential (primary) hypertension: Secondary | ICD-10-CM | POA: Diagnosis not present

## 2015-08-22 DIAGNOSIS — Z950 Presence of cardiac pacemaker: Secondary | ICD-10-CM | POA: Diagnosis not present

## 2015-08-22 DIAGNOSIS — F329 Major depressive disorder, single episode, unspecified: Secondary | ICD-10-CM | POA: Diagnosis present

## 2015-08-22 DIAGNOSIS — I441 Atrioventricular block, second degree: Secondary | ICD-10-CM | POA: Diagnosis present

## 2015-08-22 DIAGNOSIS — I130 Hypertensive heart and chronic kidney disease with heart failure and stage 1 through stage 4 chronic kidney disease, or unspecified chronic kidney disease: Principal | ICD-10-CM | POA: Diagnosis present

## 2015-08-22 DIAGNOSIS — Z9981 Dependence on supplemental oxygen: Secondary | ICD-10-CM | POA: Diagnosis not present

## 2015-08-22 DIAGNOSIS — E039 Hypothyroidism, unspecified: Secondary | ICD-10-CM | POA: Diagnosis present

## 2015-08-22 DIAGNOSIS — D509 Iron deficiency anemia, unspecified: Secondary | ICD-10-CM | POA: Diagnosis present

## 2015-08-22 DIAGNOSIS — R0902 Hypoxemia: Secondary | ICD-10-CM

## 2015-08-22 DIAGNOSIS — Z515 Encounter for palliative care: Secondary | ICD-10-CM | POA: Diagnosis not present

## 2015-08-22 DIAGNOSIS — I5033 Acute on chronic diastolic (congestive) heart failure: Secondary | ICD-10-CM | POA: Diagnosis present

## 2015-08-22 DIAGNOSIS — N189 Chronic kidney disease, unspecified: Secondary | ICD-10-CM | POA: Diagnosis present

## 2015-08-22 DIAGNOSIS — J449 Chronic obstructive pulmonary disease, unspecified: Secondary | ICD-10-CM | POA: Diagnosis present

## 2015-08-22 DIAGNOSIS — M25569 Pain in unspecified knee: Secondary | ICD-10-CM | POA: Diagnosis present

## 2015-08-22 DIAGNOSIS — Z66 Do not resuscitate: Secondary | ICD-10-CM | POA: Diagnosis present

## 2015-08-22 DIAGNOSIS — I5021 Acute systolic (congestive) heart failure: Secondary | ICD-10-CM | POA: Insufficient documentation

## 2015-08-22 DIAGNOSIS — Z7982 Long term (current) use of aspirin: Secondary | ICD-10-CM

## 2015-08-22 DIAGNOSIS — R0600 Dyspnea, unspecified: Secondary | ICD-10-CM

## 2015-08-22 DIAGNOSIS — G20 Parkinson's disease: Secondary | ICD-10-CM | POA: Diagnosis present

## 2015-08-22 DIAGNOSIS — I509 Heart failure, unspecified: Secondary | ICD-10-CM

## 2015-08-22 DIAGNOSIS — J9621 Acute and chronic respiratory failure with hypoxia: Secondary | ICD-10-CM | POA: Diagnosis present

## 2015-08-22 DIAGNOSIS — R0602 Shortness of breath: Secondary | ICD-10-CM | POA: Diagnosis present

## 2015-08-22 DIAGNOSIS — I5023 Acute on chronic systolic (congestive) heart failure: Secondary | ICD-10-CM | POA: Diagnosis not present

## 2015-08-22 DIAGNOSIS — N183 Chronic kidney disease, stage 3 (moderate): Secondary | ICD-10-CM | POA: Diagnosis not present

## 2015-08-22 LAB — CREATININE, SERUM
Creatinine, Ser: 1.13 mg/dL — ABNORMAL HIGH (ref 0.44–1.00)
GFR, EST AFRICAN AMERICAN: 46 mL/min — AB (ref >60)
GFR, EST NON AFRICAN AMERICAN: 39 mL/min — AB (ref >60)

## 2015-08-22 LAB — BASIC METABOLIC PANEL
ANION GAP: 12 (ref 5–15)
BUN: 20 mg/dL (ref 6–20)
CALCIUM: 9.2 mg/dL (ref 8.9–10.3)
CO2: 31 mmol/L (ref 22–32)
CREATININE: 1.15 mg/dL — AB (ref 0.44–1.00)
Chloride: 96 mmol/L — ABNORMAL LOW (ref 101–111)
GFR, EST AFRICAN AMERICAN: 45 mL/min — AB (ref >60)
GFR, EST NON AFRICAN AMERICAN: 39 mL/min — AB (ref >60)
Glucose, Bld: 133 mg/dL — ABNORMAL HIGH (ref 65–99)
Potassium: 3.9 mmol/L (ref 3.5–5.1)
SODIUM: 139 mmol/L (ref 135–145)

## 2015-08-22 LAB — CBC WITH DIFFERENTIAL/PLATELET
BASOS ABS: 0 10*3/uL (ref 0.0–0.1)
BASOS PCT: 0 %
EOS ABS: 0.3 10*3/uL (ref 0.0–0.7)
Eosinophils Relative: 3 %
HCT: 34.9 % — ABNORMAL LOW (ref 36.0–46.0)
HEMOGLOBIN: 10.7 g/dL — AB (ref 12.0–15.0)
Lymphocytes Relative: 12 %
Lymphs Abs: 1 10*3/uL (ref 0.7–4.0)
MCH: 29.1 pg (ref 26.0–34.0)
MCHC: 30.7 g/dL (ref 30.0–36.0)
MCV: 94.8 fL (ref 78.0–100.0)
MONOS PCT: 8 %
Monocytes Absolute: 0.7 10*3/uL (ref 0.1–1.0)
NEUTROS ABS: 6.3 10*3/uL (ref 1.7–7.7)
NEUTROS PCT: 77 %
Platelets: 291 10*3/uL (ref 150–400)
RBC: 3.68 MIL/uL — ABNORMAL LOW (ref 3.87–5.11)
RDW: 14.8 % (ref 11.5–15.5)
WBC: 8.3 10*3/uL (ref 4.0–10.5)

## 2015-08-22 LAB — BRAIN NATRIURETIC PEPTIDE: B NATRIURETIC PEPTIDE 5: 3377.7 pg/mL — AB (ref 0.0–100.0)

## 2015-08-22 LAB — I-STAT CG4 LACTIC ACID, ED: Lactic Acid, Venous: 2.31 mmol/L (ref 0.5–2.0)

## 2015-08-22 LAB — I-STAT TROPONIN, ED: TROPONIN I, POC: 0.05 ng/mL (ref 0.00–0.08)

## 2015-08-22 LAB — I-STAT VENOUS BLOOD GAS, ED
ACID-BASE EXCESS: 8 mmol/L — AB (ref 0.0–2.0)
BICARBONATE: 34.3 meq/L — AB (ref 20.0–24.0)
O2 Saturation: 96 %
TCO2: 36 mmol/L (ref 0–100)
pCO2, Ven: 51.9 mmHg — ABNORMAL HIGH (ref 45.0–50.0)
pH, Ven: 7.428 — ABNORMAL HIGH (ref 7.250–7.300)
pO2, Ven: 80 mmHg — ABNORMAL HIGH (ref 31.0–45.0)

## 2015-08-22 LAB — LACTIC ACID, PLASMA: LACTIC ACID, VENOUS: 2 mmol/L (ref 0.5–2.0)

## 2015-08-22 LAB — CBC
HEMATOCRIT: 36.2 % (ref 36.0–46.0)
HEMOGLOBIN: 11.1 g/dL — AB (ref 12.0–15.0)
MCH: 29.2 pg (ref 26.0–34.0)
MCHC: 30.7 g/dL (ref 30.0–36.0)
MCV: 95.3 fL (ref 78.0–100.0)
Platelets: 282 10*3/uL (ref 150–400)
RBC: 3.8 MIL/uL — AB (ref 3.87–5.11)
RDW: 14.9 % (ref 11.5–15.5)
WBC: 6.7 10*3/uL (ref 4.0–10.5)

## 2015-08-22 LAB — MRSA PCR SCREENING: MRSA by PCR: NEGATIVE

## 2015-08-22 MED ORDER — ACETAMINOPHEN 325 MG PO TABS
650.0000 mg | ORAL_TABLET | Freq: Four times a day (QID) | ORAL | Status: DC | PRN
  Administered 2015-08-26 – 2015-08-27 (×3): 650 mg via ORAL
  Filled 2015-08-22 (×3): qty 2

## 2015-08-22 MED ORDER — ACETAMINOPHEN 650 MG RE SUPP: 650.0000 mg | Freq: Four times a day (QID) | RECTAL | Status: DC | PRN

## 2015-08-22 MED ORDER — LEVOTHYROXINE SODIUM 88 MCG PO TABS
88.0000 ug | ORAL_TABLET | Freq: Every day | ORAL | Status: DC
  Administered 2015-08-23 – 2015-08-29 (×7): 88 ug via ORAL
  Filled 2015-08-22 (×7): qty 1

## 2015-08-22 MED ORDER — FUROSEMIDE 10 MG/ML IJ SOLN
80.0000 mg | INTRAMUSCULAR | Status: AC
  Administered 2015-08-22: 80 mg via INTRAVENOUS
  Filled 2015-08-22: qty 8

## 2015-08-22 MED ORDER — IPRATROPIUM-ALBUTEROL 0.5-2.5 (3) MG/3ML IN SOLN
3.0000 mL | Freq: Once | RESPIRATORY_TRACT | Status: AC
  Administered 2015-08-22: 3 mL via RESPIRATORY_TRACT
  Filled 2015-08-22: qty 3

## 2015-08-22 MED ORDER — ENOXAPARIN SODIUM 30 MG/0.3ML ~~LOC~~ SOLN
30.0000 mg | SUBCUTANEOUS | Status: DC
  Administered 2015-08-22 – 2015-08-28 (×7): 30 mg via SUBCUTANEOUS
  Filled 2015-08-22 (×7): qty 0.3

## 2015-08-22 MED ORDER — BENZONATATE 100 MG PO CAPS: 100.0000 mg | ORAL_CAPSULE | Freq: Three times a day (TID) | ORAL | Status: DC | PRN

## 2015-08-22 MED ORDER — METOPROLOL TARTRATE 50 MG PO TABS
50.0000 mg | ORAL_TABLET | Freq: Two times a day (BID) | ORAL | Status: DC
  Administered 2015-08-22 – 2015-08-29 (×12): 50 mg via ORAL
  Filled 2015-08-22 (×13): qty 1

## 2015-08-22 MED ORDER — SODIUM CHLORIDE 0.9% FLUSH
3.0000 mL | Freq: Two times a day (BID) | INTRAVENOUS | Status: DC
  Administered 2015-08-22 – 2015-08-29 (×13): 3 mL via INTRAVENOUS

## 2015-08-22 MED ORDER — LORAZEPAM 0.5 MG PO TABS
0.2500 mg | ORAL_TABLET | Freq: Every evening | ORAL | Status: DC | PRN
  Filled 2015-08-22: qty 1

## 2015-08-22 MED ORDER — IPRATROPIUM-ALBUTEROL 0.5-2.5 (3) MG/3ML IN SOLN
3.0000 mL | Freq: Two times a day (BID) | RESPIRATORY_TRACT | Status: DC
Start: 2015-08-22 — End: 2015-08-29
  Administered 2015-08-22 – 2015-08-29 (×14): 3 mL via RESPIRATORY_TRACT
  Filled 2015-08-22 (×13): qty 3

## 2015-08-22 MED ORDER — GUAIFENESIN-CODEINE 100-10 MG/5ML PO SOLN
5.0000 mL | Freq: Four times a day (QID) | ORAL | Status: DC | PRN
  Administered 2015-08-22 – 2015-08-29 (×5): 5 mL via ORAL
  Filled 2015-08-22 (×5): qty 5

## 2015-08-22 MED ORDER — FERROUS SULFATE 325 (65 FE) MG PO TABS
325.0000 mg | ORAL_TABLET | Freq: Every day | ORAL | Status: DC
  Administered 2015-08-23 – 2015-08-29 (×7): 325 mg via ORAL
  Filled 2015-08-22 (×7): qty 1

## 2015-08-22 MED ORDER — HYDROCODONE-ACETAMINOPHEN 5-325 MG PO TABS
1.0000 | ORAL_TABLET | Freq: Three times a day (TID) | ORAL | Status: DC | PRN
  Administered 2015-08-26: 1 via ORAL
  Filled 2015-08-22 (×2): qty 1

## 2015-08-22 MED ORDER — ASPIRIN EC 81 MG PO TBEC
81.0000 mg | DELAYED_RELEASE_TABLET | Freq: Every day | ORAL | Status: DC
  Administered 2015-08-23 – 2015-08-29 (×7): 81 mg via ORAL
  Filled 2015-08-22 (×7): qty 1

## 2015-08-22 MED ORDER — POLYETHYLENE GLYCOL 3350 17 G PO PACK: 17.0000 g | PACK | Freq: Two times a day (BID) | ORAL | Status: DC | PRN

## 2015-08-22 MED ORDER — ALBUTEROL SULFATE (2.5 MG/3ML) 0.083% IN NEBU
2.5000 mg | INHALATION_SOLUTION | Freq: Four times a day (QID) | RESPIRATORY_TRACT | Status: DC | PRN
  Administered 2015-08-25: 2.5 mg via RESPIRATORY_TRACT

## 2015-08-22 MED ORDER — METHYLPREDNISOLONE SODIUM SUCC 125 MG IJ SOLR
125.0000 mg | Freq: Once | INTRAMUSCULAR | Status: AC
  Administered 2015-08-22: 125 mg via INTRAVENOUS
  Filled 2015-08-22: qty 2

## 2015-08-22 MED ORDER — CITALOPRAM HYDROBROMIDE 20 MG PO TABS
20.0000 mg | ORAL_TABLET | Freq: Every day | ORAL | Status: DC
  Administered 2015-08-23 – 2015-08-29 (×7): 20 mg via ORAL
  Filled 2015-08-22 (×7): qty 1

## 2015-08-22 MED ORDER — DOCUSATE SODIUM 100 MG PO CAPS
100.0000 mg | ORAL_CAPSULE | Freq: Two times a day (BID) | ORAL | Status: DC
  Administered 2015-08-22 – 2015-08-29 (×14): 100 mg via ORAL
  Filled 2015-08-22 (×14): qty 1

## 2015-08-22 NOTE — ED Notes (Signed)
Admitting at bedside 

## 2015-08-22 NOTE — ED Provider Notes (Signed)
CSN: [id number]     Arrival date & time 08/22/15  1235 History   First MD Initiated Contact with Patient 08/22/15 1236     Chief Complaint  Patient presents with  . Shortness of Breath     (Consider location/radiation/quality/duration/timing/severity/associated sxs/prior Treatment) HPI Comments: 82 year old female with history of CHF, Parkinson's disease, COPD on 3L home O2, hypertension, CK D who presents with shortness of breath and hypoxia. This morning at the nursing facility, staff reports that they checked her oxygen saturations throughout the morning and they continued to steadily decline. The patient became progressively more short of breath. When EMS arrived, they noted O2 sats 70% on room air. The patient is normally on 3 L O2. They placed her on a nonrebreather during transport and oxygen saturation quickly improved. The patient states that her shortness of breath has improved since being placed on oxygen. She denies any chest pain or other complaints. Of note, the patient was hospitalized last month for CHF exacerbation and shortly before that, was hospitalized for pneumonia.  Patient is a 82 y.o. female presenting with shortness of breath. The history is provided by the EMS personnel and the patient.  Shortness of Breath   Past Medical History  Diagnosis Date  . Hypertension   . Chronic diastolic CHF (congestive heart failure) (HCC)     a. 03/2015: 2D ECHO: EF 60-65%, mild AS and AI, mildly dilated LA, mild diastolic dysfunction   . Parkinson's disease (HCC)   . COPD (chronic obstructive pulmonary disease) (HCC)     4L O2 baseline  . Heart block AV second degree     a. s/p Biotronik pulse generator: Serial # [id number] was successfully implanted on 04/12/15.   . S/P placement of cardiac pacemaker     a. Biotronik pulse generator: Serial # [id number] was successfully implanted on 04/12/15.   . Iron deficiency anemia     a. placed on iron and colase during 03/2016 admission   .  CKD (chronic kidney disease)   . HCAP (healthcare-associated pneumonia) 06/26/2015   Past Surgical History  Procedure Laterality Date  . Appendectomy      At age 75  . Total abdominal hysterectomy      Age 48  . Ep implantable device N/A 04/12/2015    Procedure: Pacemaker Implant;  Surgeon: Duke Salvia, MD;  [facility] INVASIVE CV LAB;  Service: Cardiovascular;  Laterality: N/A;   Family History  Problem Relation Age of Onset  . Hypertension Mother   . Hypertension Father   . Heart attack Neg Hx   . Stroke Neg Hx    Social History  Substance Use Topics  . Smoking status: Never Smoker   . Smokeless tobacco: None  . Alcohol Use: No   OB History    No data available     Review of Systems  Respiratory: Positive for shortness of breath.    10 Systems reviewed and are negative for acute change except as noted in the HPI.    Allergies  Review of patient's allergies indicates no known allergies.  Home Medications   Prior to Admission medications   Medication Sig Start Date End Date Taking? Authorizing Provider  acetaminophen (TYLENOL) 500 MG tablet Take 500 mg by mouth every 6 (six) hours as needed for mild pain.   Yes Historical Provider, MD  albuterol (PROVENTIL) (2.5 MG/3ML) 0.083% nebulizer solution Take 3 mLs (2.5 mg total) by nebulization every 6 (six) hours as needed for wheezing or shortness of breath.  Patient taking differently: Take 2.5 mg by nebulization 3 (three) times daily.  07/01/15  Yes Starleen Armsawood S Elgergawy, MD  aspirin EC 81 MG tablet Take 81 mg by mouth daily.   Yes Historical Provider, MD  benzonatate (TESSALON) 100 MG capsule Take 100 mg by mouth 3 (three) times daily as needed for cough.   Yes Historical Provider, MD  Cholecalciferol (VITAMIN D3 SUPER STRENGTH) 2000 UNITS CAPS Take 2,000 Units by mouth daily.   Yes Historical Provider, MD  citalopram (CELEXA) 20 MG tablet Take 20 mg by mouth daily.    Yes Historical Provider, MD  docusate sodium (COLACE)  100 MG capsule Take 100 mg by mouth 2 (two) times daily.   Yes Historical Provider, MD  ferrous sulfate 325 (65 FE) MG tablet Take 325 mg by mouth daily.   Yes Historical Provider, MD  furosemide (LASIX) 20 MG tablet Take 1 tablet (20 mg total) by mouth daily. PLEASE RESUME ON 07/14/15 Patient taking differently: Take 80 mg by mouth every other day. Alternating with 40 mg 07/12/15  Yes Osvaldo ShipperGokul Krishnan, MD  furosemide (LASIX) 40 MG tablet Take 40 mg by mouth every other day. Alternating with lasix 80 mg   Yes Historical Provider, MD  guaiFENesin-codeine (ROBITUSSIN AC) 100-10 MG/5ML syrup Take 5 mLs by mouth every 6 (six) hours as needed for cough.   Yes Historical Provider, MD  ipratropium-albuterol (DUONEB) 0.5-2.5 (3) MG/3ML SOLN Take 3 mLs by nebulization 2 (two) times daily. 07/01/15  Yes Starleen Armsawood S Elgergawy, MD  levothyroxine (SYNTHROID, LEVOTHROID) 88 MCG tablet Take 88 mcg by mouth daily before breakfast.   Yes Historical Provider, MD  LORazepam (ATIVAN) 0.5 MG tablet Take HALF tablet by mouth, at bedtime, as needed for sleep. Patient taking differently: Take 0.25 mg by mouth at bedtime as needed for sleep. Take HALF tablet by mouth, at bedtime, as needed for sleep. 07/12/15  Yes Osvaldo ShipperGokul Krishnan, MD  metoprolol (LOPRESSOR) 50 MG tablet Take 1 tablet (50 mg total) by mouth 2 (two) times daily. 04/14/15  Yes Janetta HoraKathryn R Thompson, PA-C  Multiple Vitamin (MULTIVITAMIN WITH MINERALS) TABS tablet Take 1 tablet by mouth daily.   Yes Historical Provider, MD  omeprazole (PRILOSEC) 20 MG capsule Take 20 mg by mouth daily.   Yes Historical Provider, MD  polyethylene glycol (MIRALAX / GLYCOLAX) packet Take 17 g by mouth 2 (two) times daily as needed for mild constipation.   Yes Historical Provider, MD  vitamin B-12 (CYANOCOBALAMIN) 1000 MCG tablet Take 1,000 mcg by mouth daily.   Yes Historical Provider, MD  vitamin C (ASCORBIC ACID) 500 MG tablet Take 500 mg by mouth daily.   Yes Historical Provider, MD   HYDROcodone-acetaminophen (NORCO/VICODIN) 5-325 MG tablet Take 1 tablet by mouth 3 (three) times daily as needed for moderate pain. 07/12/15   Osvaldo ShipperGokul Krishnan, MD   BP 146/89 mmHg  Pulse 81  Resp 22  SpO2 97% Physical Exam  Constitutional: She is oriented to person, place, and time. She appears well-developed and well-nourished. No distress.  Frail elderly woman  HENT:  Head: Normocephalic and atraumatic.  dry mucous membranes  Eyes: Conjunctivae are normal. Pupils are equal, round, and reactive to light.  Neck: Neck supple.  Cardiovascular: Normal rate, regular rhythm and normal heart sounds.   No murmur heard. Pulmonary/Chest: Effort normal.  Crackles b/l w/ coarse expiratory wheezes, diminished b/l bases  Abdominal: Soft. Bowel sounds are normal. She exhibits no distension. There is no tenderness.  Musculoskeletal: She exhibits no edema.  Neurological: She is alert and oriented to person, place, and time.  Fluent speech  Skin: Skin is warm and dry.  Psychiatric: She has a normal mood and affect. Judgment normal.  Nursing note and vitals reviewed.   ED Course  .Critical Care Performed by: Laurence Spates Authorized by: Laurence Spates Total critical care time: 30 minutes Critical care time was exclusive of separately billable procedures and treating other patients. Critical care was necessary to treat or prevent imminent or life-threatening deterioration of the following conditions: respiratory failure. Critical care was time spent personally by me on the following activities: discussions with consultants, development of treatment plan with patient or surrogate, evaluation of patient's response to treatment, examination of patient, obtaining history from patient or surrogate, ordering and performing treatments and interventions, ordering and review of laboratory studies, ordering and review of radiographic studies, pulse oximetry, re-evaluation of patient's condition  and review of old charts.   (including critical care time) Labs Review Labs Reviewed  BASIC METABOLIC PANEL - Abnormal; Notable for the following:    Chloride 96 (*)    Glucose, Bld 133 (*)    Creatinine, Ser 1.15 (*)    GFR calc non Af Amer 39 (*)    GFR calc Af Amer 45 (*)    All other components within normal limits  CBC WITH DIFFERENTIAL/PLATELET - Abnormal; Notable for the following:    RBC 3.68 (*)    Hemoglobin 10.7 (*)    HCT 34.9 (*)    All other components within normal limits  BRAIN NATRIURETIC PEPTIDE - Abnormal; Notable for the following:    B Natriuretic Peptide 3377.7 (*)    All other components within normal limits  I-STAT CG4 LACTIC ACID, ED - Abnormal; Notable for the following:    Lactic Acid, Venous 2.31 (*)    All other components within normal limits  I-STAT VENOUS BLOOD GAS, ED - Abnormal; Notable for the following:    pH, Ven 7.428 (*)    pCO2, Ven 51.9 (*)    pO2, Ven 80.0 (*)    Bicarbonate 34.3 (*)    Acid-Base Excess 8.0 (*)    All other components within normal limits  BLOOD GAS, VENOUS  I-STAT TROPOININ, ED    Imaging Review Dg Chest Port 1 View  08/22/2015  CLINICAL DATA:  Shortness of breath, hypoxia EXAM: PORTABLE CHEST 1 VIEW COMPARISON:  07/08/2015 FINDINGS: Low lung volumes. No frank interstitial edema or focal consolidation. No definite pleural effusion.  No pneumothorax. Cardiomegaly.  Left subclavian pacemaker. IMPRESSION: Low lung volumes. No evidence of acute cardiopulmonary disease. Electronically Signed   By: Charline Bills M.D.   On: 08/22/2015 13:34   I have personally reviewed and evaluated these lab results as part of my medical decision-making.   EKG Interpretation   Date/Time:  Sunday August 22 2015 12:41:33 EDT Ventricular Rate:  80 PR Interval:  177 QRS Duration: 176 QT Interval:  465 QTC Calculation: 536 R Axis:   -105 Text Interpretation:  Atrial-sensed ventricular-paced complexes No further  rhythm analysis  attempted due to paced rhythm Right bundle branch block  Probable inferior infarct, acute Anterior infarct, old Lateral leads are  also involved No significant change since last tracing Confirmed by Dupree Givler  MD, Loys Shugars [phone]) on 08/22/2015 12:46:00 PM     Medications  furosemide (LASIX) injection 80 mg (not administered)  ipratropium-albuterol (DUONEB) 0.5-2.5 (3) MG/3ML nebulizer solution 3 mL (3 mLs Nebulization Given 08/22/15 1335)  methylPREDNISolone sodium succinate (SOLU-MEDROL) 125  mg/2 mL injection 125 mg (125 mg Intravenous Given 08/22/15 1335)    MDM   Final diagnoses:  Diastolic CHF, acute on chronic (HCC)  Acute on chronic respiratory failure with hypoxia (HCC)   PT w/ CHF and COPD on home O2 presents from her nursing facility with worsening hypoxia and shortness of breath this morning. On arrival, the patient was awake and alert, in no respiratory distress. She was breathing comfortably on nonrebreather with O2 sat 98%. She had coarse expiratory wheezes and some crackles bilaterally. Gave the patient a DuoNeb and Solu-Medrol. Obtained above lab work as well as chest x-ray. EKG shows paced rhythm, no changes from previous.  Chest x-ray negative for acute process. Lactate mildly elevated at 2.3, VBG says pH 7.43, CO2 52. Creatinine 1.15. BNP significantly elevated from previous at 3377. Given this information, I suspect that the patient's worsening respiratory failure is due to CHF exacerbation. Gave the patient 80 mg IV Lasix. On reexamination, the patient remains mildly tachypneic on NRB. I have switched her to BiPAP for improved ventilation and to decrease her work of breathing. She is otherwise comfortable. As the patient follows with Dr. Gery Pray in the clinic, I discussed with cardiologist on call, Dr. Purvis Sheffield, who recommended spot dosing lasix and admission to medicine. Discussed with [facility], Dr. Maryfrances Bunnell.and pt admitted to stepdown for further care.  Laurence Spates,  MD 08/22/15 [phone]

## 2015-08-22 NOTE — ED Notes (Signed)
[facility], 70% RA.  Pt reports SOB, denies pain. Pt normally wears 3L O2 at home.  Hx: CHF, PNA.  EMS placed pt on NRB, pt arrives on NRB.  BP-135/77 P-80 Av paced, O2-98% NRB, Pt a x 4, NAD.   DNR at bedside.

## 2015-08-22 NOTE — H&P (Signed)
History and Physical  Patient Name: Summer Graham     ZOX:[id number]    DOB: [birth year]    DOA: 08/22/2015 Referring physician: Frederick Peers, MD PCP: Ginette Otto, MD      Chief Complaint: Dyspnea and hypoxia  HPI: Summer Graham is a 82 y.o. female with a past medical history significant for CHF and AVB with pacer, COPD, chronic respiratory failure on 4 L of oxygen, CKD and recent HCAP in January who presents with dyspnea.  The patient has been suffering from worsening swelling and shortness of breath for about 10 days, according to her and her granddaughter, present at the bedside.  She wears supplemental O2 at baseline, but has had worsening dyspnea, and so her PCP, Dr. Pete Glatter has been treating her with escalating doses of furosemide at her SNF, but family note that her symptoms are worsening.  There has been no fever, chills, dysuria, urinary urgency.  She had a vagal episode this week.  She is continuing to cough sputum since her discharge for HCAP in January.    In the ED, the patient was hypoxic requiring NRB and then BiPAP but hemodynamicaly stable and afebrile.  Na 139, K 3.9, Cr 1.15, VBG near normal, WBC normal, Hgb 10.7, lactate 2.3, TNI normal, and BNP 3300 from 300.    She was administered furosemide and the case was discussed by EDP with Cardiology, who recommended close electrolyte monitoring and diuresis.     Review of Systems:  All other systems negative except as just noted or noted in the history of present illness.  No Known Allergies  Prior to Admission medications   Medication Sig Start Date End Date Taking? Authorizing Provider  acetaminophen (TYLENOL) 500 MG tablet Take 500 mg by mouth every 6 (six) hours as needed for mild pain.   Yes Historical Provider, MD  albuterol (PROVENTIL) (2.5 MG/3ML) 0.083% nebulizer solution Take 3 mLs (2.5 mg total) by nebulization every 6 (six) hours as needed for wheezing or shortness of breath. Patient taking  differently: Take 2.5 mg by nebulization 3 (three) times daily.  07/01/15  Yes Starleen Arms, MD  aspirin EC 81 MG tablet Take 81 mg by mouth daily.   Yes Historical Provider, MD  benzonatate (TESSALON) 100 MG capsule Take 100 mg by mouth 3 (three) times daily as needed for cough.   Yes Historical Provider, MD  Cholecalciferol (VITAMIN D3 SUPER STRENGTH) 2000 UNITS CAPS Take 2,000 Units by mouth daily.   Yes Historical Provider, MD  citalopram (CELEXA) 20 MG tablet Take 20 mg by mouth daily.    Yes Historical Provider, MD  docusate sodium (COLACE) 100 MG capsule Take 100 mg by mouth 2 (two) times daily.   Yes Historical Provider, MD  ferrous sulfate 325 (65 FE) MG tablet Take 325 mg by mouth daily.   Yes Historical Provider, MD  furosemide (LASIX) 20 MG tablet Take 1 tablet (20 mg total) by mouth daily. PLEASE RESUME ON 07/14/15 Patient taking differently: Take 80 mg by mouth every other day. Alternating with 40 mg 07/12/15  Yes Osvaldo Shipper, MD  furosemide (LASIX) 40 MG tablet Take 40 mg by mouth every other day. Alternating with lasix 80 mg   Yes Historical Provider, MD  guaiFENesin-codeine (ROBITUSSIN AC) 100-10 MG/5ML syrup Take 5 mLs by mouth every 6 (six) hours as needed for cough.   Yes Historical Provider, MD  ipratropium-albuterol (DUONEB) 0.5-2.5 (3) MG/3ML SOLN Take 3 mLs by nebulization 2 (two) times daily. 07/01/15  Yes Dawood  S Elgergawy, MD  levothyroxine (SYNTHROID, LEVOTHROID) 88 MCG tablet Take 88 mcg by mouth daily before breakfast.   Yes Historical Provider, MD  LORazepam (ATIVAN) 0.5 MG tablet Take HALF tablet by mouth, at bedtime, as needed for sleep. Patient taking differently: Take 0.25 mg by mouth at bedtime as needed for sleep. Take HALF tablet by mouth, at bedtime, as needed for sleep. 07/12/15  Yes Osvaldo Shipper, MD  metoprolol (LOPRESSOR) 50 MG tablet Take 1 tablet (50 mg total) by mouth 2 (two) times daily. 04/14/15  Yes Janetta Hora, PA-C  Multiple Vitamin  (MULTIVITAMIN WITH MINERALS) TABS tablet Take 1 tablet by mouth daily.   Yes Historical Provider, MD  omeprazole (PRILOSEC) 20 MG capsule Take 20 mg by mouth daily.   Yes Historical Provider, MD  polyethylene glycol (MIRALAX / GLYCOLAX) packet Take 17 g by mouth 2 (two) times daily as needed for mild constipation.   Yes Historical Provider, MD  vitamin B-12 (CYANOCOBALAMIN) 1000 MCG tablet Take 1,000 mcg by mouth daily.   Yes Historical Provider, MD  vitamin C (ASCORBIC ACID) 500 MG tablet Take 500 mg by mouth daily.   Yes Historical Provider, MD  HYDROcodone-acetaminophen (NORCO/VICODIN) 5-325 MG tablet Take 1 tablet by mouth 3 (three) times daily as needed for moderate pain. 07/12/15   Osvaldo Shipper, MD    Past Medical History  Diagnosis Date  . Hypertension   . Chronic diastolic CHF (congestive heart failure) (HCC)     a. 03/2015: 2D ECHO: EF 60-65%, mild AS and AI, mildly dilated LA, mild diastolic dysfunction   . Parkinson's disease (HCC)   . COPD (chronic obstructive pulmonary disease) (HCC)     4L O2 baseline  . Heart block AV second degree     a. s/p Biotronik pulse generator: Serial # [id number] was successfully implanted on 04/12/15.   . S/P placement of cardiac pacemaker     a. Biotronik pulse generator: Serial # [id number] was successfully implanted on 04/12/15.   . Iron deficiency anemia     a. placed on iron and colase during 03/2016 admission   . CKD (chronic kidney disease)   . HCAP (healthcare-associated pneumonia) 06/26/2015    Past Surgical History  Procedure Laterality Date  . Appendectomy      At age 23  . Total abdominal hysterectomy      Age 68  . Ep implantable device N/A 04/12/2015    Procedure: Pacemaker Implant;  Surgeon: Duke Salvia, MD;  Location: [facility] INVASIVE CV LAB;  Service: Cardiovascular;  Laterality: N/A;    Family history: family history includes Hypertension in her father and mother. There is no history of Heart attack or Stroke.  Social  History: Patient lives in [place].  She uses a wheelchair mostly because of knee pain.  She has no dementia.  She is not a smoker.        Physical Exam: BP 135/91 mmHg  Pulse 83  Temp(Src) 98.7 F (37.1 C) (Oral)  Resp 19  Ht  (1.6 m)  Wt 66.3 kg (146 lb 2.6 oz)  BMI 25.90 kg/m2  SpO2 98% General appearance: Frail elderly adult female, sleepy but in moderate distress from dyspnea, on BiPAP.   Eyes: Anicteric, conjunctiva pink, lids and lashes normal.     ENT: No nasal deformity, discharge, or epistaxis.  OP moist without lesions.   Lymph: No cervical or supraclavicular lymphadenopathy. Skin: Warm and dry.  No jaundice.  No suspicious rashes or lesions. Cardiac: Tachycardic regular,  nl S1-S2, no murmurs appreciated on my exam.  Capillary refill is brisk.  JVs distended.  1+ LE edema.  Radial pulses 2+ and symmetric. Respiratory: On BiPAP.  Coarse breath sounds, as of airway edema, bilaterally. Abdomen: Abdomen soft without rigidity.  No TTP. No ascites, distension.   MSK: No deformities or effusions. Neuro: A&Ox3. Sensorium intact and responding to questions, attention normal.  Speech is fluent.  Moves all extremities equally and with normal coordination.    Psych: Behavior appropriate.  Affect normal.  No evidence of aural or visual hallucinations or delusions.       Labs on Admission:  The metabolic panel shows normal renal function and electrolytes. VBG shows pH slightly elevated, pCO2 51 only, O2 normal on BiPAP.   Lactate 2.3 TNI normal BNP 3300 pg/mL from 300 pg/mL one month ago. The complete blood count shows no leukocytosis, chronic anemia.   Radiological Exams on Admission: Personally reviewed: Dg Chest Port 1 View  08/22/2015  CLINICAL DATA:  Shortness of breath, hypoxia EXAM: PORTABLE CHEST 1 VIEW COMPARISON:  07/08/2015 FINDINGS: Low lung volumes. No frank interstitial edema or focal consolidation. No definite pleural effusion.  No pneumothorax.  Cardiomegaly.  Left subclavian pacemaker. IMPRESSION: Low lung volumes. No evidence of acute cardiopulmonary disease. Electronically Signed   By: Charline BillsSriyesh  Krishnan M.D.   On: 08/22/2015 13:34    EKG: Independently reviewed. Atrial paced, rate 80.    Assessment/Plan 1. Acute CHF:  This is new.  Hypoxia without pulmonary edema on CXR but with marked rise in BNP.      -Furosemide given -Consult to Cardiology, appreciate cares and recommendations -Strict I/Os and daily weights and daily BMP -Redose furosemide give renal function and dyspnea overnight -BiPAP for comfort only   2. COPD:  No wheezes on exam. -Continue Duoneb twice daily and albuterol PRN -Discussed Pulm consult requested by family ("because she still has this cough since her pneumonia in January"), but deferred until CHF treatment can progress, as I suspect her cough is related to pulmonary edema  3. Anemia:  Stable -Continue iron  4. Hypothyroidism:  -Continue home levothyroxine  5. Depression:  -Continue citalopram       DVT PPx: Lovenox Diet: Heart healthy Consultants: Cardiology Code Status: DO NOT RESUSCITATE Family Communication: Granddaughter, present at bedside  Medical decision making: What exists of the patient's previous chart was reviewed in depth and the case was discussed with Dr. Clarene DukeLittle. Patient seen 4:28 PM on 08/22/2015.  Disposition Plan:  I recommend admission to stepdown given BPAP needs.  Clinical condition: stable at present, but overall prognosis poror given age.  Anticipate diuresis and close monitoring of renal function and electrolytes.  If breathing status improves, may be able to dischage back to SNF.  Palliative care not consulted during last hospitalization, but recommended as outpatient.  May be work revisiting again, although family are more interested in pulmonary consult (for her continued airway secretions).      Alberteen SamChristopher P Yuriy Cui [facility] Pager  [phone]

## 2015-08-22 NOTE — ED Notes (Signed)
Lab notified regarding BNP-to add on now.

## 2015-08-23 ENCOUNTER — Inpatient Hospital Stay (HOSPITAL_COMMUNITY): Payer: Medicare Other

## 2015-08-23 LAB — CBC
HEMATOCRIT: 32.7 % — AB (ref 36.0–46.0)
HEMOGLOBIN: 10 g/dL — AB (ref 12.0–15.0)
MCH: 28.7 pg (ref 26.0–34.0)
MCHC: 30.6 g/dL (ref 30.0–36.0)
MCV: 94 fL (ref 78.0–100.0)
Platelets: 272 10*3/uL (ref 150–400)
RBC: 3.48 MIL/uL — AB (ref 3.87–5.11)
RDW: 14.6 % (ref 11.5–15.5)
WBC: 6.2 10*3/uL (ref 4.0–10.5)

## 2015-08-23 LAB — BASIC METABOLIC PANEL
Anion gap: 10 (ref 5–15)
BUN: 20 mg/dL (ref 6–20)
CHLORIDE: 97 mmol/L — AB (ref 101–111)
CO2: 36 mmol/L — AB (ref 22–32)
Calcium: 9 mg/dL (ref 8.9–10.3)
Creatinine, Ser: 1.12 mg/dL — ABNORMAL HIGH (ref 0.44–1.00)
GFR calc non Af Amer: 40 mL/min — ABNORMAL LOW (ref >60)
GFR, EST AFRICAN AMERICAN: 46 mL/min — AB (ref >60)
Glucose, Bld: 167 mg/dL — ABNORMAL HIGH (ref 65–99)
POTASSIUM: 3.9 mmol/L (ref 3.5–5.1)
SODIUM: 143 mmol/L (ref 135–145)

## 2015-08-23 MED ORDER — TECHNETIUM TC 99M DIETHYLENETRIAME-PENTAACETIC ACID: 30.0000 | Freq: Once | INTRAVENOUS | Status: DC | PRN

## 2015-08-23 MED ORDER — TECHNETIUM TO 99M ALBUMIN AGGREGATED
4.2000 | Freq: Once | INTRAVENOUS | Status: AC | PRN
  Administered 2015-08-23: 4 via INTRAVENOUS

## 2015-08-23 MED ORDER — FUROSEMIDE 10 MG/ML IJ SOLN
60.0000 mg | Freq: Every day | INTRAMUSCULAR | Status: DC
  Administered 2015-08-23: 60 mg via INTRAVENOUS
  Filled 2015-08-23: qty 6

## 2015-08-23 NOTE — Progress Notes (Signed)
Decreased VM to 12L/50% from 14L/55%. RT will continue to monitor.

## 2015-08-23 NOTE — Procedures (Signed)
Pt is resting well on [place] without any noticed distress.  BiPAP on standby in room.  RT will continue to monitor pt and will placed if/when needed.

## 2015-08-23 NOTE — Progress Notes (Signed)
Rn placed Pt on NRB following low spo2 after being in IR for VQ scan. Pt taken off NRB and placed on 12L HFNC with spo2 97%. Pt alert and denies SOB at this time. RN at bedside. RT will continue to monitor and wean O2 as tolerated.  

## 2015-08-23 NOTE — Progress Notes (Signed)
[facility] PROGRESS NOTE  Summer ProvencalBlanche Graham ZOX:[id number] DOB: 1915/07/13 DOA: 08/22/2015  PCP: Ginette OttoSTONEKING,HAL THOMAS, MD  Brief HPI: 82 year old Caucasian female with a past medical history of diastolic congestive heart failure, atrial ventricular block, status post pacemaker, COPD with chronic respiratory failure on 4 L of oxygen, chronic kidney disease, was hospitalized back in February for acute and chronic diastolic congestive heart failure. She was diuresed with Lasix. She improved and was discharged to skilled nursing facility. She presents back with hypoxia with oxygen saturations in the 70s. She was hospitalized for further management.  Past medical history:  Past Medical History  Diagnosis Date  . Hypertension   . Chronic diastolic CHF (congestive heart failure) (HCC)     a. 03/2015: 2D ECHO: EF 60-65%, mild AS and AI, mildly dilated LA, mild diastolic dysfunction   . Parkinson's disease (HCC)   . COPD (chronic obstructive pulmonary disease) (HCC)     4L O2 baseline  . Heart block AV second degree     a. s/p Biotronik pulse generator: Serial # [id number] was successfully implanted on 04/12/15.   . S/P placement of cardiac pacemaker     a. Biotronik pulse generator: Serial # [id number] was successfully implanted on 04/12/15.   . Iron deficiency anemia     a. placed on iron and colase during 03/2016 admission   . CKD (chronic kidney disease)   . HCAP (healthcare-associated pneumonia) 06/26/2015    Consultants: None  Procedures: None  Antibiotics: None  Subjective: Patient denies any chest pain. She states that breathing is improved. No nausea or vomiting. Somewhat confused and distracted.  Objective: Vital Signs  Filed Vitals:   08/23/15 0419 08/23/15 0808 08/23/15 0811 08/23/15 1114  BP:   135/70 121/59  Pulse:   66 90  Temp:   97.1 F (36.2 C) 97.6 F (36.4 C)  TempSrc:   Axillary Axillary  Resp:   18 14  Height:      Weight: 66.6 kg (146 lb 13.2 oz)       SpO2:  97%  96%    Intake/Output Summary (Last 24 hours) at 08/23/15 1132 Last data filed at 08/22/15 2130  Gross per 24 hour  Intake      0 ml  Output    350 ml  Net   -350 ml   Filed Weights   08/22/15 1700 08/23/15 0419  Weight: 66.3 kg (146 lb 2.6 oz) 66.6 kg (146 lb 13.2 oz)    General appearance: alert, cooperative, appears stated age, distracted and no distress Resp: Diminished air entry at the bases. Few crackles bilaterally. No wheezing. No rhonchi. Cardio: regular rate and rhythm, S1, S2 normal, no murmur, click, rub or gallop GI: soft, non-tender; bowel sounds normal; no masses,  no organomegaly Extremities: Minimal pedal edema bilaterally. No erythema. Neurologic: Awake and alert. Distracted. No facial asymmetry. Motor strength is equal, symmetrical bilaterally.  Lab Results:  Basic Metabolic Panel:  Recent Labs Lab 08/22/15 1318 08/22/15 1816 08/23/15 0257  NA 139  --  143  K 3.9  --  3.9  CL 96*  --  97*  CO2 31  --  36*  GLUCOSE 133*  --  167*  BUN 20  --  20  CREATININE 1.15* 1.13* 1.12*  CALCIUM 9.2  --  9.0   CBC:  Recent Labs Lab 08/22/15 1318 08/22/15 1816 08/23/15 0257  WBC 8.3 6.7 6.2  NEUTROABS 6.3  --   --   HGB 10.7* 11.1* 10.0*  HCT 34.9* 36.2 32.7*  MCV 94.8 95.3 94.0  PLT 291 282 272   BNP (last 3 results)  Recent Labs  06/25/15 2246 07/08/15 1018 08/22/15 1359  BNP 473.5* 327.8* 3377.7*     Recent Results (from the past 240 hour(s))  MRSA PCR Screening     Status: None   Collection Time: 08/22/15  5:51 PM  Result Value Ref Range Status   MRSA by PCR NEGATIVE NEGATIVE Final    Comment:        The GeneXpert MRSA Assay (FDA approved for NASAL specimens only), is one component of a comprehensive MRSA colonization surveillance program. It is not intended to diagnose MRSA infection nor to guide or monitor treatment for MRSA infections.       Studies/Results: Dg Chest Port 1 View  08/22/2015  CLINICAL DATA:   Shortness of breath, hypoxia EXAM: PORTABLE CHEST 1 VIEW COMPARISON:  07/08/2015 FINDINGS: Low lung volumes. No frank interstitial edema or focal consolidation. No definite pleural effusion.  No pneumothorax. Cardiomegaly.  Left subclavian pacemaker. IMPRESSION: Low lung volumes. No evidence of acute cardiopulmonary disease. Electronically Signed   By: Charline Bills M.D.   On: 08/22/2015 13:34    Medications:  Scheduled: . aspirin EC  81 mg Oral Daily  . citalopram  20 mg Oral Daily  . docusate sodium  100 mg Oral BID  . enoxaparin (LOVENOX) injection  30 mg Subcutaneous Q24H  . ferrous sulfate  325 mg Oral Daily  . furosemide  60 mg Intravenous Daily  . ipratropium-albuterol  3 mL Nebulization BID  . levothyroxine  88 mcg Oral QAC breakfast  . metoprolol  50 mg Oral BID  . sodium chloride flush  3 mL Intravenous Q12H   Continuous:  ZOX:[id number] **OR** acetaminophen, albuterol, benzonatate, guaiFENesin-codeine, HYDROcodone-acetaminophen, LORazepam, polyethylene glycol  Assessment/Plan:  Principal Problem:   CHF exacerbation (HCC) Active Problems:   Heart block AV second degree   Essential hypertension   Iron deficiency anemia   S/P placement of cardiac pacemaker   CKD (chronic kidney disease)   Diastolic CHF, acute on chronic (HCC)   Hypothyroidism   Acute on chronic respiratory failure with hypoxia (HCC)   Acute systolic heart failure (HCC)    Acute respiratory failure with hypoxia Thought to be secondary to acute diastolic CHF. BNP was significantly elevated. Patient has been started on Lasix, which will be continued. Titrate down oxygen as tolerated to keep sats greater than 92%. Since there is no overt pulmonary edema noted on chest x-ray, we would like to rule out venous thromboembolism. VQ scan has been ordered. Of note, CT angiogram done in January did not show any PE.  Acute diastolic congestive heart failure Echocardiogram report from November 2016 reviewed.  Systolic function was noted to be normal. Please see above as well. Continue IV Lasix. Monitor ins and outs. Daily weights. Her weight is recorded as 66.6 kg. When she was discharged back in February she was down to 63.6 kg. She was supposed to have resumed her Lasix at her usual dose at the skilled nursing facility. She might need a higher dose of Lasix.  History of COPD Patient does not have any wheezing currently. Continue her home medications. Does not appear to be acute COPD exacerbation.  Likely chronic kidney disease, unknown stage Creatinine is better than what it was when she was discharged from the hospital, but higher than her baseline. She likely does have chronic kidney disease, although is difficult to be certain. During her  previous hospitalization her ARB was held. Celebrex was also held. Monitor creatinine closely while she is being diuresed.  History of cardiac pacemaker for AV block Paced Rhythm.  History of essential hypertension During her previous hospitalization amlodipine was discontinued due to low blood pressures. Unclear if this was resumed. Blood pressure is reasonably well controlled. She is noted to be on metoprolol.  Iron deficiency Anemia Stable. Continue iron  Hypothyroidism Continue home levothyroxine  Depression Continue citalopram  DVT Prophylaxis: Lovenox    Code Status: DO NOT RESUSCITATE  Family Communication: Discussed with her son, Dannielle Huh  Disposition Plan: Management as above. Try to try to drop her oxygen as possible.    LOS: 1 day   [facility]  [facility] Pager [phone] 08/23/2015, 11:32 AM  If 7PM-7AM, please contact night-coverage at [email or web address], password [facility]

## 2015-08-24 ENCOUNTER — Inpatient Hospital Stay (HOSPITAL_COMMUNITY): Payer: Medicare Other

## 2015-08-24 LAB — BASIC METABOLIC PANEL
ANION GAP: 11 (ref 5–15)
BUN: 21 mg/dL — ABNORMAL HIGH (ref 6–20)
CALCIUM: 9 mg/dL (ref 8.9–10.3)
CO2: 34 mmol/L — ABNORMAL HIGH (ref 22–32)
Chloride: 97 mmol/L — ABNORMAL LOW (ref 101–111)
Creatinine, Ser: 1.05 mg/dL — ABNORMAL HIGH (ref 0.44–1.00)
GFR, EST AFRICAN AMERICAN: 50 mL/min — AB (ref >60)
GFR, EST NON AFRICAN AMERICAN: 43 mL/min — AB (ref >60)
GLUCOSE: 141 mg/dL — AB (ref 65–99)
Potassium: 3.6 mmol/L (ref 3.5–5.1)
SODIUM: 142 mmol/L (ref 135–145)

## 2015-08-24 LAB — CBC
HCT: 34.2 % — ABNORMAL LOW (ref 36.0–46.0)
HEMOGLOBIN: 10.4 g/dL — AB (ref 12.0–15.0)
MCH: 29 pg (ref 26.0–34.0)
MCHC: 30.4 g/dL (ref 30.0–36.0)
MCV: 95.3 fL (ref 78.0–100.0)
Platelets: 300 10*3/uL (ref 150–400)
RBC: 3.59 MIL/uL — ABNORMAL LOW (ref 3.87–5.11)
RDW: 14.8 % (ref 11.5–15.5)
WBC: 11.4 10*3/uL — AB (ref 4.0–10.5)

## 2015-08-24 MED ORDER — POTASSIUM CHLORIDE CRYS ER 20 MEQ PO TBCR
40.0000 meq | EXTENDED_RELEASE_TABLET | Freq: Once | ORAL | Status: AC
  Administered 2015-08-24: 40 meq via ORAL
  Filled 2015-08-24: qty 2

## 2015-08-24 MED ORDER — FUROSEMIDE 10 MG/ML IJ SOLN
60.0000 mg | Freq: Three times a day (TID) | INTRAMUSCULAR | Status: AC
  Administered 2015-08-24 (×3): 60 mg via INTRAVENOUS
  Filled 2015-08-24 (×3): qty 6

## 2015-08-24 NOTE — Clinical Documentation Improvement (Signed)
Hospitalist  Can the diagnosis of CHF be further specified?    Type - Systolic, Diastolic, Systolic and Diastolic  Other  Clinically Undetermined   Supporting Information: In medical record it is noted mostly to say diastolic and then in same area of progress note acute systolic CHF is listed in active problem list.  Please clarify.  Please exercise your independent, professional judgment when responding. A specific answer is not anticipated or expected.   Thank Modesta MessingYou,  Jariyah Hackley L [facility] Information Management [place] [phone]

## 2015-08-24 NOTE — Progress Notes (Addendum)
[facility] PROGRESS NOTE  Rachelanne Whidby ZOX:[id number] DOB: [birth year] DOA: 08/22/2015  PCP: Ginette Otto, MD  Brief HPI: 82 year old Caucasian female with a past medical history of diastolic congestive heart failure, atrial ventricular block, status post pacemaker, COPD with chronic respiratory failure on 4 L of oxygen, chronic kidney disease, was hospitalized back in February for acute and chronic diastolic congestive heart failure. She was diuresed with Lasix. She improved and was discharged to skilled nursing facility. She presents back with hypoxia with oxygen saturations in the 70s. She was hospitalized for further management.  Past medical history:  Past Medical History  Diagnosis Date  . Hypertension   . Chronic diastolic CHF (congestive heart failure) (HCC)     a. 03/2015: 2D ECHO: EF 60-65%, mild AS and AI, mildly dilated LA, mild diastolic dysfunction   . Parkinson's disease (HCC)   . COPD (chronic obstructive pulmonary disease) (HCC)     4L O2 baseline  . Heart block AV second degree     a. s/p Biotronik pulse generator: Serial # [id number] was successfully implanted on 04/12/15.   . S/P placement of cardiac pacemaker     a. Biotronik pulse generator: Serial # [id number] was successfully implanted on 04/12/15.   . Iron deficiency anemia     a. placed on iron and colase during 03/2016 admission   . CKD (chronic kidney disease)   . HCAP (healthcare-associated pneumonia) 06/26/2015    Consultants: None  Procedures: None  Antibiotics: None  Subjective: Patient states that she slept well. Denies any chest pain. Shortness of breath improved. Denies any nausea or vomiting.   Objective: Vital Signs  Filed Vitals:   08/24/15 0900 08/24/15 1000 08/24/15 1100 08/24/15 1129  BP: 135/65 114/66 105/65   Pulse: 69 74 65   Temp:    97.5 F (36.4 C)  TempSrc:    Oral  Resp: Height:      Weight:      SpO2: 93% 92% 98%     Intake/Output Summary  (Last 24 hours) at 08/24/15 1200 Last data filed at 08/24/15 0800  Gross per 24 hour  Intake    120 ml  Output      0 ml  Net    120 ml   Filed Weights   08/22/15 1700 08/23/15 0419 08/24/15 0320  Weight: 66.3 kg (146 lb 2.6 oz) 66.6 kg (146 lb 13.2 oz) 65.1 kg (143 lb 8.3 oz)    General appearance: alert, cooperative, appears stated age, distracted and no distress Resp:  crackles Heard bilaterally, especially at the bases. No wheezing. No rhonchi. Cardio: regular rate and rhythm, S1, S2 normal, no murmur, click, rub or gallop GI: soft, non-tender; bowel sounds normal; no masses,  no organomegaly Extremities: Minimal pedal edema bilaterally. No erythema. Neurologic: Awake and alert. Distracted. No facial asymmetry. Motor strength is equal, symmetrical bilaterally.  Lab Results:  Basic Metabolic Panel:  Recent Labs Lab 08/22/15 1318 08/22/15 1816 08/23/15 0257 08/24/15 0234  NA 139  --  143 142  K 3.9  --  3.9 3.6  CL 96*  --  97* 97*  CO2 31  --  36* 34*  GLUCOSE 133*  --  167* 141*  BUN 20  --  20 21*  CREATININE 1.15* 1.13* 1.12* 1.05*  CALCIUM 9.2  --  9.0 9.0   CBC:  Recent Labs Lab 08/22/15 1318 08/22/15 1816 08/23/15 0257 08/24/15 0234  WBC 8.3 6.7 6.2 11.4*  NEUTROABS  6.3  --   --   --   HGB 10.7* 11.1* 10.0* 10.4*  HCT 34.9* 36.2 32.7* 34.2*  MCV 94.8 95.3 94.0 95.3  PLT 291 282 272 300   BNP (last 3 results)  Recent Labs  06/25/15 2246 07/08/15 1018 08/22/15 1359  BNP 473.5* 327.8* 3377.7*     Recent Results (from the past 240 hour(s))  MRSA PCR Screening     Status: None   Collection Time: 08/22/15  5:51 PM  Result Value Ref Range Status   MRSA by PCR NEGATIVE NEGATIVE Final    Comment:        The GeneXpert MRSA Assay (FDA approved for NASAL specimens only), is one component of a comprehensive MRSA colonization surveillance program. It is not intended to diagnose MRSA infection nor to guide or monitor treatment for MRSA  infections.       Studies/Results: Nm Pulmonary Perf And Vent  08/23/2015  CLINICAL DATA:  Hypoxia with oxygen saturations in the 70s percent, diastolic CHF, AV block, COPD, respiratory failure, chronic kidney disease, hypertension EXAM: NUCLEAR MEDICINE VENTILATION - PERFUSION LUNG SCAN TECHNIQUE: Ventilation images were obtained in multiple projections using inhaled aerosol Tc-5972m DTPA. Perfusion images were obtained in multiple projections after intravenous injection of Tc-872m MAA. RADIOPHARMACEUTICALS:  30.0 mCi Technetium-6072m DTPA aerosol inhalation and 4.2 mCi Technetium-7172m MAA IV COMPARISON:  None Radiographic correlation: Chest radiograph 08/22/2015, CTA chest 06/26/2015 FINDINGS: Ventilation: Lobar diminished ventilation LEFT lower lobe. Central airway deposition of tracer. Perfusion: Diminished perfusion LEFT lower lobe though less severely impaired than ventilation. Mass effect on LEFT lower lobe by enlargement of heart. No other perfusion defects. Chest radiograph: Enlargement of cardiac silhouette and pulmonary vascular congestion post pacemaker placement. IMPRESSION: Diminished ventilation and less severely impaired diminished perfusion in LEFT lower lobe. Findings favor parenchymal lung disease and represent a low probability for pulmonary embolism. Electronically Signed   By: Ulyses SouthwardMark  Boles M.D.   On: 08/23/2015 16:01   Dg Chest Port 1 View  08/24/2015  CLINICAL DATA:  Shortness of breath. EXAM: PORTABLE CHEST 1 VIEW COMPARISON:  08/22/2015. FINDINGS: Cardiac pacer with lead tips in right atrium right ventricle. Stable cardiomegaly. Low lung volumes with mild bibasilar atelectasis and or infiltrates/edema. Small left pleural effusion cannot be excluded. No pneumothorax. IMPRESSION: 1. Cardiac pacer and stable position.  Stable cardiomegaly. 2. Lung volumes with mild bibasilar atelectasis. Mild bibasilar infiltrates and or edema cannot be excluded on today's exam. Small left pleural effusion  cannot be excluded . Electronically Signed   By: Maisie Fushomas  Register   On: 08/24/2015 07:55   Dg Chest Port 1 View  08/22/2015  CLINICAL DATA:  Shortness of breath, hypoxia EXAM: PORTABLE CHEST 1 VIEW COMPARISON:  07/08/2015 FINDINGS: Low lung volumes. No frank interstitial edema or focal consolidation. No definite pleural effusion.  No pneumothorax. Cardiomegaly.  Left subclavian pacemaker. IMPRESSION: Low lung volumes. No evidence of acute cardiopulmonary disease. Electronically Signed   By: Charline BillsSriyesh  Yasmina Chico M.D.   On: 08/22/2015 13:34    Medications:  Scheduled: . aspirin EC  81 mg Oral Daily  . citalopram  20 mg Oral Daily  . docusate sodium  100 mg Oral BID  . enoxaparin (LOVENOX) injection  30 mg Subcutaneous Q24H  . ferrous sulfate  325 mg Oral Daily  . furosemide  60 mg Intravenous 3 times per day  . ipratropium-albuterol  3 mL Nebulization BID  . levothyroxine  88 mcg Oral QAC breakfast  . metoprolol  50 mg Oral BID  .  sodium chloride flush  3 mL Intravenous Q12H   Continuous:  RUE:[id number] **OR** acetaminophen, albuterol, benzonatate, guaiFENesin-codeine, HYDROcodone-acetaminophen, LORazepam, polyethylene glycol, technetium TC 60M diethylenetriame-pentaacetic acid  Assessment/Plan:  Principal Problem:   CHF exacerbation (HCC) Active Problems:   Heart block AV second degree   Essential hypertension   Iron deficiency anemia   S/P placement of cardiac pacemaker   CKD (chronic kidney disease)   Diastolic CHF, acute on chronic (HCC)   Hypothyroidism   Acute on chronic respiratory failure with hypoxia (HCC)    Acute respiratory failure with hypoxia Thought to be secondary to acute diastolic CHF. BNP was significantly elevated. Patient has been started on Lasix, which will be continued. Repeat chest x-ray today. Increase dose of Lasix. Patient is now on high flow oxygen by nasal cannula. Continue to further titrate. Keep saturations greater than 92%. VQ scan was low  probability for PE. Decreased ventilation in the left lower lung is most likely due to the small pleural effusion.  Acute diastolic congestive heart failure Echocardiogram report from November 2016 reviewed. Systolic function was noted to be normal. Please see above as well. Continue IV Lasix. Monitor ins and outs. Daily weights. Her weight is reducing. Continue IV Lasix aggressively for now. When she was discharged back in February she was down to 63.6 kg. She was supposed to have resumed her Lasix at her usual dose at the skilled nursing facility. She might need a higher dose of Lasix at discharge.  History of COPD Patient does not have any wheezing currently. Continue her home medications. Does not appear to be acute COPD exacerbation.  Likely chronic kidney disease, unable to determine stage clinically Creatinine is stable and is better than what it was when she was discharged from the hospital, but higher than her baseline. She likely does have chronic kidney disease, although is difficult to be certain. During her previous hospitalization her ARB was held. Celebrex was also held. Monitor creatinine closely while she is being diuresed.  History of cardiac pacemaker for AV block Paced Rhythm.  History of essential hypertension During her previous hospitalization amlodipine was discontinued due to low blood pressures. Unclear if this was resumed. Blood pressure is reasonably well controlled. She is noted to be on metoprolol.  Iron deficiency Anemia Stable. Continue iron  Hypothyroidism Continue home levothyroxine  Depression Continue citalopram  Discussed in detail with the patient's son, Dannielle Huh. Apparently palliative medicine did see the patient and talked to the son about 3 weeks ago at the skilled nursing facility. Son and patient believes that the focus should be mainly on quality of life. In view of this, the plan will be to continue aggressive diuresis. She will need to be  discharged on a higher dose of Lasix without worrying about renal function. Keeping her symptom-free, will be the mainstay of treatment. And perhaps consider hospice services depending on how she does in the next few weeks. Palliative medicine will need to get reinvolved at the skilled nursing facility.  DVT Prophylaxis: Lovenox    Code Status: DO NOT RESUSCITATE  Family Communication: Discussed with her son, Dannielle Huh  Disposition Plan: Management as above. Continue to wean her oxygen.    LOS: 2 days   [facility]  [facility] Pager [phone] 08/24/2015, 12:00 PM  If 7PM-7AM, please contact night-coverage at [email or web address], password [facility]

## 2015-08-24 NOTE — Clinical Documentation Improvement (Signed)
Hospitalist  Can the diagnosis of CKD be further specified?   CKD Stage I - GFR greater than or equal to 90  CKD Stage II - GFR 60-89  CKD Stage III - GFR 30-59  CKD Stage IV - GFR 15-29  CKD Stage V - GFR < 15  ESRD (End Stage Renal Disease)  Other condition  Unable to clinically determine   Supporting Information: : (risk factors, signs and symptoms, diagnostics, treatment) BUN 20, Creatinine 1.15; GFR - 45  Please exercise your independent, professional judgment when responding. A specific answer is not anticipated or expected.   Thank Modesta MessingYou, Bayne Fosnaugh L [facility] Information Management [place] [phone]

## 2015-08-25 DIAGNOSIS — I5033 Acute on chronic diastolic (congestive) heart failure: Secondary | ICD-10-CM

## 2015-08-25 DIAGNOSIS — J9621 Acute and chronic respiratory failure with hypoxia: Secondary | ICD-10-CM

## 2015-08-25 DIAGNOSIS — I5023 Acute on chronic systolic (congestive) heart failure: Secondary | ICD-10-CM

## 2015-08-25 DIAGNOSIS — N183 Chronic kidney disease, stage 3 (moderate): Secondary | ICD-10-CM

## 2015-08-25 LAB — CBC
HEMATOCRIT: 34.2 % — AB (ref 36.0–46.0)
Hemoglobin: 10.8 g/dL — ABNORMAL LOW (ref 12.0–15.0)
MCH: 30.3 pg (ref 26.0–34.0)
MCHC: 31.6 g/dL (ref 30.0–36.0)
MCV: 95.8 fL (ref 78.0–100.0)
PLATELETS: 281 10*3/uL (ref 150–400)
RBC: 3.57 MIL/uL — AB (ref 3.87–5.11)
RDW: 14.8 % (ref 11.5–15.5)
WBC: 8.3 10*3/uL (ref 4.0–10.5)

## 2015-08-25 LAB — BASIC METABOLIC PANEL
ANION GAP: 10 (ref 5–15)
BUN: 17 mg/dL (ref 6–20)
CO2: 38 mmol/L — AB (ref 22–32)
Calcium: 8.7 mg/dL — ABNORMAL LOW (ref 8.9–10.3)
Chloride: 94 mmol/L — ABNORMAL LOW (ref 101–111)
Creatinine, Ser: 0.95 mg/dL (ref 0.44–1.00)
GFR calc Af Amer: 56 mL/min — ABNORMAL LOW (ref >60)
GFR, EST NON AFRICAN AMERICAN: 49 mL/min — AB (ref >60)
GLUCOSE: 104 mg/dL — AB (ref 65–99)
POTASSIUM: 3.3 mmol/L — AB (ref 3.5–5.1)
Sodium: 142 mmol/L (ref 135–145)

## 2015-08-25 LAB — BRAIN NATRIURETIC PEPTIDE: B Natriuretic Peptide: 2417.7 pg/mL — ABNORMAL HIGH (ref 0.0–100.0)

## 2015-08-25 MED ORDER — POTASSIUM CHLORIDE 10 MEQ/100ML IV SOLN
10.0000 meq | INTRAVENOUS | Status: DC
  Filled 2015-08-25: qty 100

## 2015-08-25 MED ORDER — FUROSEMIDE 10 MG/ML IJ SOLN
40.0000 mg | Freq: Three times a day (TID) | INTRAMUSCULAR | Status: DC
  Administered 2015-08-25 – 2015-08-26 (×4): 40 mg via INTRAVENOUS
  Filled 2015-08-25 (×4): qty 4

## 2015-08-25 MED ORDER — POTASSIUM CHLORIDE CRYS ER 20 MEQ PO TBCR
40.0000 meq | EXTENDED_RELEASE_TABLET | Freq: Two times a day (BID) | ORAL | Status: DC
Start: 2015-08-25 — End: 2015-08-27
  Administered 2015-08-25 – 2015-08-27 (×5): 40 meq via ORAL
  Filled 2015-08-25 (×5): qty 2

## 2015-08-25 NOTE — Progress Notes (Signed)
[facility] PROGRESS NOTE  Summer Graham ZOX:[id number] DOB: 03-Jan-1916 DOA: 08/22/2015  PCP: Ginette Otto, MD  Brief HPI: 82 year old Caucasian female with a past medical history of diastolic congestive heart failure, atrial ventricular block, status post pacemaker, COPD with chronic respiratory failure on 4 L of oxygen, chronic kidney disease, was hospitalized back in February for acute and chronic diastolic congestive heart failure. She was diuresed with Lasix. She improved and was discharged to skilled nursing facility. She presents back with hypoxia with oxygen saturations in the 70s. She was hospitalized for further management.  Past medical history:  Past Medical History  Diagnosis Date  . Hypertension   . Chronic diastolic CHF (congestive heart failure) (HCC)     a. 03/2015: 2D ECHO: EF 60-65%, mild AS and AI, mildly dilated LA, mild diastolic dysfunction   . Parkinson's disease (HCC)   . COPD (chronic obstructive pulmonary disease) (HCC)     4L O2 baseline  . Heart block AV second degree     a. s/p Biotronik pulse generator: Serial # [id number] was successfully implanted on 04/12/15.   . S/P placement of cardiac pacemaker     a. Biotronik pulse generator: Serial # [id number] was successfully implanted on 04/12/15.   . Iron deficiency anemia     a. placed on iron and colase during 03/2016 admission   . CKD (chronic kidney disease)   . HCAP (healthcare-associated pneumonia) 06/26/2015    Consultants: None  Procedures: None  Antibiotics: None  Subjective: Still on high flow nasal cannula, hemodynamically stable   Objective: Vital Signs  Filed Vitals:   08/25/15 0500 08/25/15 0507 08/25/15 0600 08/25/15 0801  BP: 124/60 124/60 106/66   Pulse: 65  93   Temp:  97.6 F (36.4 C)    TempSrc:  Oral    Resp: Height:      Weight:      SpO2: 96% 97% 89% 94%    Intake/Output Summary (Last 24 hours) at 08/25/15 0816 Last data filed at 08/24/15  1400  Gross per 24 hour  Intake    120 ml  Output      0 ml  Net    120 ml   Filed Weights   08/22/15 1700 08/23/15 0419 08/24/15 0320  Weight: 66.3 kg (146 lb 2.6 oz) 66.6 kg (146 lb 13.2 oz) 65.1 kg (143 lb 8.3 oz)    General appearance: alert, cooperative, appears stated age, distracted and no distress Resp:  crackles Heard bilaterally, especially at the bases. No wheezing. No rhonchi. Cardio: regular rate and rhythm, S1, S2 normal, no murmur, click, rub or gallop GI: soft, non-tender; bowel sounds normal; no masses,  no organomegaly Extremities: Minimal pedal edema bilaterally. No erythema. Neurologic: Awake and alert. Distracted. No facial asymmetry. Motor strength is equal, symmetrical bilaterally.  Lab Results:  Basic Metabolic Panel:  Recent Labs Lab 08/22/15 1318 08/22/15 1816 08/23/15 0257 08/24/15 0234 08/25/15 0420  NA 139  --  143 142 142  K 3.9  --  3.9 3.6 3.3*  CL 96*  --  97* 97* 94*  CO2 31  --  36* 34* 38*  GLUCOSE 133*  --  167* 141* 104*  BUN 20  --  20 21* 17  CREATININE 1.15* 1.13* 1.12* 1.05* 0.95  CALCIUM 9.2  --  9.0 9.0 8.7*   CBC:  Recent Labs Lab 08/22/15 1318 08/22/15 1816 08/23/15 0257 08/24/15 0234 08/25/15 0420  WBC 8.3 6.7 6.2 11.4* 8.3  NEUTROABS 6.3  --   --   --   --   HGB 10.7* 11.1* 10.0* 10.4* 10.8*  HCT 34.9* 36.2 32.7* 34.2* 34.2*  MCV 94.8 95.3 94.0 95.3 95.8  PLT 291 282 272 300 281   BNP (last 3 results)  Recent Labs  07/08/15 1018 08/22/15 1359 08/25/15 0333  BNP 327.8* 3377.7* 2417.7*     Recent Results (from the past 240 hour(s))  MRSA PCR Screening     Status: None   Collection Time: 08/22/15  5:51 PM  Result Value Ref Range Status   MRSA by PCR NEGATIVE NEGATIVE Final    Comment:        The GeneXpert MRSA Assay (FDA approved for NASAL specimens only), is one component of a comprehensive MRSA colonization surveillance program. It is not intended to diagnose MRSA infection nor to guide  or monitor treatment for MRSA infections.       Studies/Results: Nm Pulmonary Perf And Vent  08/23/2015  CLINICAL DATA:  Hypoxia with oxygen saturations in the 70s percent, diastolic CHF, AV block, COPD, respiratory failure, chronic kidney disease, hypertension EXAM: NUCLEAR MEDICINE VENTILATION - PERFUSION LUNG SCAN TECHNIQUE: Ventilation images were obtained in multiple projections using inhaled aerosol Tc-56m DTPA. Perfusion images were obtained in multiple projections after intravenous injection of Tc-56m MAA. RADIOPHARMACEUTICALS:  30.0 mCi Technetium-29m DTPA aerosol inhalation and 4.2 mCi Technetium-52m MAA IV COMPARISON:  None Radiographic correlation: Chest radiograph 08/22/2015, CTA chest 06/26/2015 FINDINGS: Ventilation: Lobar diminished ventilation LEFT lower lobe. Central airway deposition of tracer. Perfusion: Diminished perfusion LEFT lower lobe though less severely impaired than ventilation. Mass effect on LEFT lower lobe by enlargement of heart. No other perfusion defects. Chest radiograph: Enlargement of cardiac silhouette and pulmonary vascular congestion post pacemaker placement. IMPRESSION: Diminished ventilation and less severely impaired diminished perfusion in LEFT lower lobe. Findings favor parenchymal lung disease and represent a low probability for pulmonary embolism. Electronically Signed   By: Ulyses Southward M.D.   On: 08/23/2015 16:01   Dg Chest Port 1 View  08/24/2015  CLINICAL DATA:  Shortness of breath. EXAM: PORTABLE CHEST 1 VIEW COMPARISON:  08/22/2015. FINDINGS: Cardiac pacer with lead tips in right atrium right ventricle. Stable cardiomegaly. Low lung volumes with mild bibasilar atelectasis and or infiltrates/edema. Small left pleural effusion cannot be excluded. No pneumothorax. IMPRESSION: 1. Cardiac pacer and stable position.  Stable cardiomegaly. 2. Lung volumes with mild bibasilar atelectasis. Mild bibasilar infiltrates and or edema cannot be excluded on today's  exam. Small left pleural effusion cannot be excluded . Electronically Signed   By: Maisie Fus  Register   On: 08/24/2015 07:55    Medications:  Scheduled: . aspirin EC  81 mg Oral Daily  . citalopram  20 mg Oral Daily  . docusate sodium  100 mg Oral BID  . enoxaparin (LOVENOX) injection  30 mg Subcutaneous Q24H  . ferrous sulfate  325 mg Oral Daily  . furosemide  40 mg Intravenous 3 times per day  . ipratropium-albuterol  3 mL Nebulization BID  . levothyroxine  88 mcg Oral QAC breakfast  . metoprolol  50 mg Oral BID  . potassium chloride  10 mEq Intravenous Q1 Hr x 3  . potassium chloride  40 mEq Oral BID  . sodium chloride flush  3 mL Intravenous Q12H   Continuous:  WUJ:[id number] **OR** acetaminophen, albuterol, benzonatate, guaiFENesin-codeine, HYDROcodone-acetaminophen, LORazepam, polyethylene glycol, technetium TC 71M diethylenetriame-pentaacetic acid  Assessment/Plan:  Principal Problem:   CHF exacerbation (HCC) Active Problems:  Heart block AV second degree   Essential hypertension   Iron deficiency anemia   S/P placement of cardiac pacemaker   CKD (chronic kidney disease)   Diastolic CHF, acute on chronic (HCC)   Hypothyroidism   Acute on chronic respiratory failure with hypoxia (HCC)    Acute respiratory failure with hypoxia Thought to be secondary to acute diastolic CHF. BNP was significantly elevated. Patient has been started on Lasix, which will be continued dose decreased from 60 mg to 40 mg IV every 8. Repeat chest x-ray  possible mild edema/effusions.  Patient is now on high flow oxygen by nasal cannula. Continue to further titrate. Keep saturations greater than 92%. VQ scan was low probability for PE. Decreased ventilation in the left lower lung is most likely due to the small pleural effusion. Palliative care consultation for goals of care, hospice eligibility   Acute diastolic congestive heart failure Echocardiogram report from November 2016 reviewed.  Systolic function was noted to be normal. Please see above as well. Continue IV Lasix. Monitor ins and outs. Daily weights. Her weight is reducing. Continue IV Lasix aggressively for now. When she was discharged back in February she was down to 63.6 kg. She was supposed to have resumed her Lasix at her usual dose at the skilled nursing facility. She might need a higher dose of Lasix at discharge.  History of COPD Patient does not have any wheezing currently. Continue her home medications. Does not appear to be acute COPD exacerbation.  Likely chronic kidney disease, unable to determine stage clinically Creatinine is stable and is better than what it was when she was discharged from the hospital, but higher than her baseline. She likely does have chronic kidney disease, although is difficult to be certain. During her previous hospitalization her ARB was held. Celebrex was also held. Monitor creatinine closely while she is being diuresed.  History of cardiac pacemaker for AV block Paced Rhythm.  History of essential hypertension During her previous hospitalization amlodipine was discontinued due to low blood pressures. Unclear if this was resumed. Blood pressure is reasonably well controlled. She is noted to be on metoprolol.  Iron deficiency Anemia Stable. Continue iron  Hypothyroidism Continue home levothyroxine  Depression Continue citalopram  Discussed in detail with the patient's son, Dannielle HuhDanny. Apparently palliative medicine did see the patient and talked to the son about 3 weeks ago at the skilled nursing facility. Son and patient believes that the focus should be mainly on quality of life. In view of this, the plan will be to continue aggressive diuresis. She will need to be discharged on a higher dose of Lasix without worrying about renal function. Keeping her symptom-free, will be the mainstay of treatment. And perhaps consider hospice services depending on how she does in the next few  weeks. Reconsult palliative care medication to see the patient is eligible for hospice upon discharge  DVT Prophylaxis: Lovenox    Code Status: DO NOT RESUSCITATE  Family Communication: Discussed with her son, Dannielle HuhDanny  Disposition Plan:  . Continue to wean her oxygen. Pending palliative care consult     LOS: 3 days   [facility],Fe Okubo  [facility] Pager [phone] 08/25/2015, 8:16 AM  If 7PM-7AM, please contact night-coverage at [email or web address], password [email or web address]

## 2015-08-25 NOTE — Care Management Important Message (Signed)
Important Message  Patient Details  Name: Summer Graham MRN: [id number] Date of Birth: September 13, 1915   Medicare Important Message Given:  Yes    Karmine Kauer P Larae Caison 08/25/2015, 10:17 AM

## 2015-08-26 ENCOUNTER — Encounter (HOSPITAL_COMMUNITY): Payer: Self-pay | Admitting: Primary Care

## 2015-08-26 DIAGNOSIS — I509 Heart failure, unspecified: Secondary | ICD-10-CM

## 2015-08-26 DIAGNOSIS — Z515 Encounter for palliative care: Secondary | ICD-10-CM

## 2015-08-26 DIAGNOSIS — Z7189 Other specified counseling: Secondary | ICD-10-CM | POA: Insufficient documentation

## 2015-08-26 LAB — CBC
HEMATOCRIT: 35.9 % — AB (ref 36.0–46.0)
HEMOGLOBIN: 11 g/dL — AB (ref 12.0–15.0)
MCH: 29.1 pg (ref 26.0–34.0)
MCHC: 30.6 g/dL (ref 30.0–36.0)
MCV: 95 fL (ref 78.0–100.0)
Platelets: 306 10*3/uL (ref 150–400)
RBC: 3.78 MIL/uL — ABNORMAL LOW (ref 3.87–5.11)
RDW: 14.4 % (ref 11.5–15.5)
WBC: 7.7 10*3/uL (ref 4.0–10.5)

## 2015-08-26 LAB — COMPREHENSIVE METABOLIC PANEL
ALBUMIN: 2.7 g/dL — AB (ref 3.5–5.0)
ALT: 13 U/L — ABNORMAL LOW (ref 14–54)
ANION GAP: 9 (ref 5–15)
AST: 20 U/L (ref 15–41)
Alkaline Phosphatase: 73 U/L (ref 38–126)
BILIRUBIN TOTAL: 0.7 mg/dL (ref 0.3–1.2)
BUN: 15 mg/dL (ref 6–20)
CHLORIDE: 94 mmol/L — AB (ref 101–111)
CO2: 37 mmol/L — ABNORMAL HIGH (ref 22–32)
Calcium: 8.8 mg/dL — ABNORMAL LOW (ref 8.9–10.3)
Creatinine, Ser: 0.96 mg/dL (ref 0.44–1.00)
GFR calc Af Amer: 56 mL/min — ABNORMAL LOW (ref >60)
GFR calc non Af Amer: 48 mL/min — ABNORMAL LOW (ref >60)
GLUCOSE: 98 mg/dL (ref 65–99)
POTASSIUM: 4.3 mmol/L (ref 3.5–5.1)
SODIUM: 140 mmol/L (ref 135–145)
TOTAL PROTEIN: 5.9 g/dL — AB (ref 6.5–8.1)

## 2015-08-26 MED ORDER — FUROSEMIDE 10 MG/ML IJ SOLN
40.0000 mg | Freq: Two times a day (BID) | INTRAMUSCULAR | Status: DC
  Administered 2015-08-26 – 2015-08-27 (×2): 40 mg via INTRAVENOUS
  Filled 2015-08-26 (×2): qty 4

## 2015-08-26 NOTE — Consult Note (Signed)
Consultation Note Date: 08/26/2015   Patient Name: Summer Graham  DOB: 11-Dec-1915  MRN: [id number]  Age / Sex: 82 y.o., female  PCP: Summer LaughterHal Stoneking, Graham Referring Physician: Calvert CantorSaima Rizwan, Graham  Reason for Consultation: Disposition, Establishing goals of care and Psychosocial/spiritual support    Clinical Assessment/Narrative:  Summer Graham is a 82 yo female with a history of COPD, (O2 dependant), CKD, HCAP in January 2017, and CHF with AVB and pacer.  She was brought to the ED for dyspnea, increased swelling and SOB and conintued cought.  She was noted to have BNP of 3300.    Summer Graham is resting quietly in bed,  She makes eye contact and greets me as I enter.  Her son and DIL, Summer Graham and Summer Graham are at bedside.  We talk about her current dx with CHF and her history of COPD.  Summer Graham shares that they have palliative services with [facility], but that they would like to have more frequent consultations.  We talk about HCPOA and MOST form (they have completed).  We talk about CHF exacerbations, sodium and fluid restrictions and that exacerbations happen with no cause at times.  I share the chronic illness trajectory and the changes we see as we near end of life.  Summer Graham and Summer Graham talk about quality over quantity.  They share their worry over repeated exacerbations and hospitalizations.  We talk about having increased services with Hospice (RN visits more frequently) and that they can have a consultation from their current provider.    Contacts/Participants in Discussion: Summer Graham is present, but unable to hear well enough to fully participate in conversations. She agrees for me to talk with her son, Summer Graham and DIL Summer Graham.  Primary Decision Maker: [place] and Summer Graham ( age 82 with chronic health problems).    Relationship to Patient sons HCPOA: yes  Sons Graham and Summer Graham share HCPOA  SUMMARY OF  RECOMMENDATIONS  Code Status/Advance Care Planning: DNR-  Allow a natural death    Code Status Orders        Start     Ordered   08/22/15 1744  Do not attempt resuscitation (DNR)   Continuous    Question Answer Comment  In the event of cardiac or respiratory ARREST Do not call a "code blue"   In the event of cardiac or respiratory ARREST Do not perform Intubation, CPR, defibrillation or ACLS   In the event of cardiac or respiratory ARREST Use medication by any route, position, wound care, and other measures to relive pain and suffering. May use oxygen, suction and manual treatment of airway obstruction as needed for comfort.      08/22/15 1746    Code Status History    Date Active Date Inactive Code Status Order ID Comments User Context   07/08/2015  2:37 PM 07/12/2015  4:22 PM DNR [id number]  Summer Graham Inpatient   06/26/2015  3:19 AM 07/01/2015  6:41 PM DNR [id number]  Summer BowJared M Gardner, DO ED   06/26/2015 12:15 AM 06/26/2015  3:19 AM DNR [id number]  Summer Batonourtney F Horton, Graham ED   04/12/2015  5:50 PM 04/14/2015  4:49 PM Full Code [id number]  Summer Graham Inpatient   04/08/2015  4:38 PM 04/12/2015  5:50 PM Full Code [id number]  Summer Graham Inpatient    Advance Directive Documentation        Most Recent Value   Type of Advance Directive  Healthcare Power  of Attorney, Living will, Out of facility DNR (pink MOST or yellow form)   Pre-existing out of facility DNR order (yellow form or pink MOST form)  Yellow form placed in chart (order not valid for inpatient use)   "MOST" Form in Place?        Other Directives:MOST Form  Symptom Management:   Per hospitalist,  We discuss the use of Morphine in the future to assist with work of breathing and pain issues when Summer Graham nears end of life.   Palliative Prophylaxis:   Aspiration, Frequent Pain Assessment and Turn Reposition  Additional Recommendations (Limitations, Scope, Preferences):  Tret the treatable.   No PEG  tube, no artificial hydration for life support.   We discuss the concepts of do not re hospitalize, do not treat the next infection.   Psycho-social/Spiritual:  Support System: Strong Desire for further Chaplaincy support:Not discussed today.  Additional Recommendations: Caregiving  Support/Resources and Grief/Bereavement Support  Prognosis: Unable to determine, likely 6 months or less based on end stage COPD and CHF.   Discharge Planning: Skilled Nursing Facility for rehab with Palliative care service follow-up, return to [facility] with Palliative care, considering Hospice services for increased layer of support. States they will meet with Hospice provider when returning to facility, to discuss available services.    Chief Complaint/ Primary Diagnoses: Present on Admission:  . Heart block AV second degree . Essential hypertension . Iron deficiency anemia . S/P placement of cardiac pacemaker . CKD (chronic kidney disease) . Diastolic CHF, acute on chronic (HCC) . Hypothyroidism  I have reviewed the medical record, interviewed the patient and family, and examined the patient. The following aspects are pertinent.  Past Medical History  Diagnosis Date  . Hypertension   . Chronic diastolic CHF (congestive heart failure) (HCC)     a. 03/2015: 2D ECHO: EF 60-65%, mild AS and AI, mildly dilated LA, mild diastolic dysfunction   . Parkinson's disease (HCC)   . COPD (chronic obstructive pulmonary disease) (HCC)     4L O2 baseline  . Heart block AV second degree     a. s/p Biotronik pulse generator: Serial # [id number] was successfully implanted on 04/12/15.   . S/P placement of cardiac pacemaker     a. Biotronik pulse generator: Serial # [id number] was successfully implanted on 04/12/15.   . Iron deficiency anemia     a. placed on iron and colase during 03/2016 admission   . CKD (chronic kidney disease)   . HCAP (healthcare-associated pneumonia) 06/26/2015   Social History   Social  History  . Marital Status: Widowed    Spouse Name: N/A  . Number of Children: N/A  . Years of Education: N/A   Social History Main Topics  . Smoking status: Never Smoker   . Smokeless tobacco: None  . Alcohol Use: No  . Drug Use: No  . Sexual Activity: Not Asked   Other Topics Concern  . None   Social History Narrative   Currently resides at [facility] in [place]. Uses a walker and wheelchair for ambulation.   Family History  Problem Relation Age of Onset  . Hypertension Mother   . Hypertension Father   . Heart attack Neg Hx   . Stroke Neg Hx    Scheduled Meds: . aspirin EC  81 mg Oral Daily  . citalopram  20 mg Oral Daily  . docusate sodium  100 mg Oral BID  . enoxaparin (LOVENOX) injection  30 mg Subcutaneous Q24H  .  ferrous sulfate  325 mg Oral Daily  . furosemide  40 mg Intravenous BID  . ipratropium-albuterol  3 mL Nebulization BID  . levothyroxine  88 mcg Oral QAC breakfast  . metoprolol  50 mg Oral BID  . potassium chloride  40 mEq Oral BID  . sodium chloride flush  3 mL Intravenous Q12H   Continuous Infusions:  PRN Meds:.acetaminophen **OR** acetaminophen, albuterol, benzonatate, guaiFENesin-codeine, HYDROcodone-acetaminophen, LORazepam, polyethylene glycol, technetium TC 70M diethylenetriame-pentaacetic acid Medications Prior to Admission:  Prior to Admission medications   Medication Sig Start Date End Date Taking? Authorizing Provider  acetaminophen (TYLENOL) 500 MG tablet Take 500 mg by mouth every 6 (six) hours as needed for mild pain.   Yes Historical Provider, Graham  albuterol (PROVENTIL) (2.5 MG/3ML) 0.083% nebulizer solution Take 3 mLs (2.5 mg total) by nebulization every 6 (six) hours as needed for wheezing or shortness of breath. Patient taking differently: Take 2.5 mg by nebulization 3 (three) times daily.  07/01/15  Yes Starleen Arms, Graham  aspirin EC 81 MG tablet Take 81 mg by mouth daily.   Yes Historical Provider, Graham  benzonatate (TESSALON)  100 MG capsule Take 100 mg by mouth 3 (three) times daily as needed for cough.   Yes Historical Provider, Graham  Cholecalciferol (VITAMIN D3 SUPER STRENGTH) 2000 UNITS CAPS Take 2,000 Units by mouth daily.   Yes Historical Provider, Graham  citalopram (CELEXA) 20 MG tablet Take 20 mg by mouth daily.    Yes Historical Provider, Graham  docusate sodium (COLACE) 100 MG capsule Take 100 mg by mouth 2 (two) times daily.   Yes Historical Provider, Graham  ferrous sulfate 325 (65 FE) MG tablet Take 325 mg by mouth daily.   Yes Historical Provider, Graham  furosemide (LASIX) 20 MG tablet Take 1 tablet (20 mg total) by mouth daily. PLEASE RESUME ON 07/14/15 Patient taking differently: Take 80 mg by mouth every other day. Alternating with 40 mg 07/12/15  Yes Osvaldo Shipper, Graham  furosemide (LASIX) 40 MG tablet Take 40 mg by mouth every other day. Alternating with lasix 80 mg   Yes Historical Provider, Graham  guaiFENesin-codeine (ROBITUSSIN AC) 100-10 MG/5ML syrup Take 5 mLs by mouth every 6 (six) hours as needed for cough.   Yes Historical Provider, Graham  ipratropium-albuterol (DUONEB) 0.5-2.5 (3) MG/3ML SOLN Take 3 mLs by nebulization 2 (two) times daily. 07/01/15  Yes Starleen Arms, Graham  levothyroxine (SYNTHROID, LEVOTHROID) 88 MCG tablet Take 88 mcg by mouth daily before breakfast.   Yes Historical Provider, Graham  LORazepam (ATIVAN) 0.5 MG tablet Take HALF tablet by mouth, at bedtime, as needed for sleep. Patient taking differently: Take 0.25 mg by mouth at bedtime as needed for sleep. Take HALF tablet by mouth, at bedtime, as needed for sleep. 07/12/15  Yes Osvaldo Shipper, Graham  metoprolol (LOPRESSOR) 50 MG tablet Take 1 tablet (50 mg total) by mouth 2 (two) times daily. 04/14/15  Yes Janetta Hora, Graham-C  Multiple Vitamin (MULTIVITAMIN WITH MINERALS) TABS tablet Take 1 tablet by mouth daily.   Yes Historical Provider, Graham  omeprazole (PRILOSEC) 20 MG capsule Take 20 mg by mouth daily.   Yes Historical Provider, Graham  polyethylene  glycol (MIRALAX / GLYCOLAX) packet Take 17 g by mouth 2 (two) times daily as needed for mild constipation.   Yes Historical Provider, Graham  vitamin B-12 (CYANOCOBALAMIN) 1000 MCG tablet Take 1,000 mcg by mouth daily.   Yes Historical Provider, Graham  vitamin C (ASCORBIC ACID) 500 MG  tablet Take 500 mg by mouth daily.   Yes Historical Provider, Graham  HYDROcodone-acetaminophen (NORCO/VICODIN) 5-325 MG tablet Take 1 tablet by mouth 3 (three) times daily as needed for moderate pain. 07/12/15   Osvaldo Shipper, Graham   No Known Allergies  Review of Systems  Unable to perform ROS: Age    Physical Exam  Nursing note and vitals reviewed. Constitutional: No distress.  Frail elderly  Cardiovascular: Normal rate.   No edema  Respiratory: Effort normal. No respiratory distress.  GI: Soft. She exhibits no distension. There is no guarding.  Neurological: She is alert.  Skin: Skin is warm and dry.    Vital Signs: BP 142/67 mmHg  Pulse 77  Temp(Src) 98.2 F (36.8 C) (Oral)  Resp 26  Ht 5\' 3"  (1.6 m)  Wt 63.6 kg (140 lb 3.4 oz)  BMI 24.84 kg/m2  SpO2 96%  SpO2: SpO2: 96 % O2 Device:SpO2: 96 % O2 Flow Rate: .O2 Flow Rate (L/min): 10 L/min  IO: Intake/output summary:  Intake/Output Summary (Last 24 hours) at 08/26/15 1411 Last data filed at 08/26/15 1610  Gross per 24 hour  Intake    320 ml  Output    350 ml  Net    -30 ml    LBM: Last BM Date: 08/26/15 (small one earlier this am per nurse ) Baseline Weight: Weight: 66.3 kg (146 lb 2.6 oz) Most recent weight: Weight: 63.6 kg (140 lb 3.4 oz)      Palliative Assessment/Data:  Flowsheet Rows        Most Recent Value   Intake Tab    Referral Department  Hospitalist   Unit at Time of Referral  Cardiac/Telemetry Unit   Palliative Care Primary Diagnosis  Cardiac   Date Notified  08/25/15   Palliative Care Type  New Palliative care   Reason for referral  Psychosocial or Spiritual support, Clarify Goals of Care, Counsel Regarding Hospice   Date  of Admission  08/22/15   Date first seen by Palliative Care  08/26/15   # of days Palliative referral response time  1 Day(s)   # of days IP prior to Palliative referral  3   Clinical Assessment    Palliative Performance Scale Score  20%   Pain Max last 24 hours  Not able to report   Pain Min Last 24 hours  Not able to report   Dyspnea Max Last 24 Hours  Not able to report   Dyspnea Min Last 24 hours  Not able to report   Psychosocial & Spiritual Assessment    Social Work Plan of Care  Staff support   Palliative Care Outcomes    Patient/Family meeting held?  Yes   Who was at the meeting?  son Summer Huh and DIL [facility]   Palliative Care Outcomes  Clarified goals of care, Provided advance care planning, Provided end of life care assistance, Provided psychosocial or spiritual support   Patient/Family wishes: Interventions discontinued/not started   Mechanical Ventilation, PEG   Palliative Care follow-up planned  No      Additional Data Reviewed:  CBC:    Component Value Date/Time   WBC 7.7 08/26/2015 0350   HGB 11.0* 08/26/2015 0350   HCT 35.9* 08/26/2015 0350   PLT 306 08/26/2015 0350   MCV 95.0 08/26/2015 0350   NEUTROABS 6.3 08/22/2015 1318   LYMPHSABS 1.0 08/22/2015 1318   MONOABS 0.7 08/22/2015 1318   EOSABS 0.3 08/22/2015 1318   BASOSABS 0.0 08/22/2015 1318   Comprehensive  Metabolic Panel:    Component Value Date/Time   NA 140 08/26/2015 0350   K 4.3 08/26/2015 0350   CL 94* 08/26/2015 0350   CO2 37* 08/26/2015 0350   BUN 15 08/26/2015 0350   CREATININE 0.96 08/26/2015 0350   GLUCOSE 98 08/26/2015 0350   CALCIUM 8.8* 08/26/2015 0350   AST 20 08/26/2015 0350   ALT 13* 08/26/2015 0350   ALKPHOS 73 08/26/2015 0350   BILITOT 0.7 08/26/2015 0350   PROT 5.9* 08/26/2015 0350   ALBUMIN 2.7* 08/26/2015 0350     Time In: 1050  Time Out: 1150 Time Total: 60 minutes Greater than 50%  of this time was spent counseling and coordinating care related to the above assessment  and plan.  Signed by: Katheran Awe, NP  Katheran Awe, NP  08/26/2015, 2:11 PM  Please contact Palliative Medicine Team phone at [phone] for questions and concerns.

## 2015-08-26 NOTE — Progress Notes (Signed)
[facility] Progress Note   Cornelius Schuitema  RUE:[id number]  DOB: [birth year]  DOA: 08/22/2015 PCP: Ginette Otto, MD  Brief narrative: Summer Graham is a 82 y.o. female with a past medical history of diastolic congestive heart failure, atrial ventricular block, status post pacemaker, COPD with chronic respiratory failure on 4 L of oxygen, chronic kidney disease, was hospitalized back in February for acute and chronic diastolic congestive heart failure. She was diuresed with Lasix. She improved and was discharged to skilled nursing facility. She presents back with hypoxia with oxygen saturations in the 70s. She was hospitalized for further management   Subjective: No dyspnea at rest. No cough, nausea, vomiting.   Assessment/Plan:  Acute respiratory failure with hypoxia Thought to be secondary to acute diastolic CHF? BNP was significantly elevated. Repeat chest x-ray possible mild edema/effusions. Patient was on high flow oxygen by nasal cannula.. VQ scan was low probability for PE. Decreased ventilation in the left lower lung is most likely due to the small pleural effusion. Palliative care consultation for goals of care- recommending d/c back to SNF with Palliative care (not full comfort care) - now on 7 LO2- back to baseline weight- cut back on Lasix slightly today-   Acute diastolic congestive heart failure - Echocardiogram report from November 2016 reviewed. Systolic function was noted to be normal.    - When she was discharged back in February she was down to 63.6 kg. She was supposed to have resumed her Lasix at her usual dose at the skilled nursing facility. She might need a higher dose of Lasix at discharge. -change Lasix ot Q12- back down to 63 kg today  History of COPD Patient does not have any wheezing currently. Continue her home medications. Does not appear to be acute COPD exacerbation.  Likely chronic kidney disease, unable to determine stage  clinically Creatinine improved to 0.96. During her previous hospitalization her ARB was held. Celebrex was also held. Monitor creatinine closely while she is being diuresed.  History of cardiac pacemaker for AV block Paced Rhythm.  History of essential hypertension During her previous hospitalization amlodipine was discontinued due to low blood pressures. Unclear if this was resumed. Blood pressure is reasonably well controlled.  -cont metoprolol.  Iron deficiency Anemia Stable. Continue iron  Hypothyroidism Continue home levothyroxine  Depression Continue citalopram  Reconsult palliative care medication to see the patient is eligible for hospice upon discharge   Antibiotics: Anti-infectives    None     Code Status:     Code Status Orders        Start     Ordered   08/22/15 1744  Do not attempt resuscitation (DNR)   Continuous    Question Answer Comment  In the event of cardiac or respiratory ARREST Do not call a "code blue"   In the event of cardiac or respiratory ARREST Do not perform Intubation, CPR, defibrillation or ACLS   In the event of cardiac or respiratory ARREST Use medication by any route, position, wound care, and other measures to relive pain and suffering. May use oxygen, suction and manual treatment of airway obstruction as needed for comfort.      08/22/15 1746    Code Status History    Date Active Date Inactive Code Status Order ID Comments User Context   07/08/2015  2:37 PM 07/12/2015  4:22 PM DNR [id number]  Maryruth Bun Rama, MD Inpatient   06/26/2015  3:19 AM 07/01/2015  6:41 PM DNR [id number]  Hillary Bow, DO  ED   06/26/2015 12:15 AM 06/26/2015  3:19 AM DNR [id number]  Shon Baton, MD ED   04/12/2015  5:50 PM 04/14/2015  4:49 PM Full Code [id number]  Duke Salvia, MD Inpatient   04/08/2015  4:38 PM 04/12/2015  5:50 PM Full Code [id number]  Ellsworth Lennox, PA Inpatient    Advance Directive Documentation        Most Recent Value   Type of  Advance Directive  Healthcare Power of Attorney, Living will, Out of facility DNR (pink MOST or yellow form)   Pre-existing out of facility DNR order (yellow form or pink MOST form)  Yellow form placed in chart (order not valid for inpatient use)   "MOST" Form in Place?       Family Communication:  Disposition Plan: [facility] with rehab and palliative care when stable- family to meet with hospice at facility DVT prophylaxis: Lovenox Consultants: palliative care Procedures: none    Objective: Filed Weights   08/23/15 0419 08/24/15 0320 08/26/15 0416  Weight: 66.6 kg (146 lb 13.2 oz) 65.1 kg (143 lb 8.3 oz) 63.6 kg (140 lb 3.4 oz)    Intake/Output Summary (Last 24 hours) at 08/26/15 1652 Last data filed at 08/26/15 6295  Gross per 24 hour  Intake    320 ml  Output    350 ml  Net    -30 ml     Vitals Filed Vitals:   08/26/15 1000 08/26/15 1207 08/26/15 1400 08/26/15 1615  BP: 104/64 142/67 91/44 107/71  Pulse: 78 77 93 84  Temp:  98.2 F (36.8 C)  98 F (36.7 C)  TempSrc:  Oral  Oral  Resp: Height:      Weight:      SpO2: 91% 96% 96% 95%    Exam:  General:  Pt is alert, not in acute distress, very hard of hearing  HEENT: No icterus, No thrush, oral mucosa moist  Cardiovascular: regular rate and rhythm, S1/S2 No murmur  Respiratory: clear to auscultation bilaterally- poor air entry  Abdomen: Soft, +Bowel sounds, non tender, non distended, no guarding  MSK: No cyanosis or clubbing- no pedal edema   Data Reviewed: Basic Metabolic Panel:  Recent Labs Lab 08/22/15 1318 08/22/15 1816 08/23/15 0257 08/24/15 0234 08/25/15 0420 08/26/15 0350  NA 139  --  143 142 142 140  K 3.9  --  3.9 3.6 3.3* 4.3  CL 96*  --  97* 97* 94* 94*  CO2 31  --  36* 34* 38* 37*  GLUCOSE 133*  --  167* 141* 104* 98  BUN 20  --  20 21* 17 15  CREATININE 1.15* 1.13* 1.12* 1.05* 0.95 0.96  CALCIUM 9.2  --  9.0 9.0 8.7* 8.8*   Liver Function Tests:  Recent  Labs Lab 08/26/15 0350  AST 20  ALT 13*  ALKPHOS 73  BILITOT 0.7  PROT 5.9*  ALBUMIN 2.7*   No results for input(s): LIPASE, AMYLASE in the last 168 hours. No results for input(s): AMMONIA in the last 168 hours. CBC:  Recent Labs Lab 08/22/15 1318 08/22/15 1816 08/23/15 0257 08/24/15 0234 08/25/15 0420 08/26/15 0350  WBC 8.3 6.7 6.2 11.4* 8.3 7.7  NEUTROABS 6.3  --   --   --   --   --   HGB 10.7* 11.1* 10.0* 10.4* 10.8* 11.0*  HCT 34.9* 36.2 32.7* 34.2* 34.2* 35.9*  MCV 94.8 95.3 94.0 95.3 95.8 95.0  PLT 291  282 272 300 281 306   Cardiac Enzymes: No results for input(s): CKTOTAL, CKMB, CKMBINDEX, TROPONINI in the last 168 hours. BNP (last 3 results)  Recent Labs  07/08/15 1018 08/22/15 1359 08/25/15 0333  BNP 327.8* 3377.7* 2417.7*    ProBNP (last 3 results) No results for input(s): PROBNP in the last 8760 hours.  CBG: No results for input(s): GLUCAP in the last 168 hours.  Recent Results (from the past 240 hour(s))  MRSA PCR Screening     Status: None   Collection Time: 08/22/15  5:51 PM  Result Value Ref Range Status   MRSA by PCR NEGATIVE NEGATIVE Final    Comment:        The GeneXpert MRSA Assay (FDA approved for NASAL specimens only), is one component of a comprehensive MRSA colonization surveillance program. It is not intended to diagnose MRSA infection nor to guide or monitor treatment for MRSA infections.      Studies: No results found.  Scheduled Meds:  Scheduled Meds: . aspirin EC  81 mg Oral Daily  . citalopram  20 mg Oral Daily  . docusate sodium  100 mg Oral BID  . enoxaparin (LOVENOX) injection  30 mg Subcutaneous Q24H  . ferrous sulfate  325 mg Oral Daily  . furosemide  40 mg Intravenous BID  . ipratropium-albuterol  3 mL Nebulization BID  . levothyroxine  88 mcg Oral QAC breakfast  . metoprolol  50 mg Oral BID  . potassium chloride  40 mEq Oral BID  . sodium chloride flush  3 mL Intravenous Q12H   Continuous Infusions:    Time spent on care of this patient: 35 min   Sallyanne Birkhead, MD 08/26/2015, 4:52 PM  LOS: 4 days   [facility] Office  [phone] Pager - Text Page per [email or web address] If 7PM-7AM, please contact night-coverage [email or web address]

## 2015-08-26 NOTE — Clinical Social Work Note (Signed)
CSW [facility], plan [facility] for [facility] and [facility]  Ervin KnackEric R. Georgine Wiltse, MSW, Theresia MajorsLCSWA [phone] 08/26/2015 6:26 PM

## 2015-08-26 NOTE — Progress Notes (Addendum)
[facility] PROGRESS NOTE  Sallee ProvencalBlanche Houchin ZOX:[id number] DOB: 11/23/1915 DOA: 08/22/2015  PCP: Ginette OttoSTONEKING,HAL THOMAS, MD  Brief HPI: 82 year old Caucasian female with a past medical history of diastolic congestive heart failure, atrial ventricular block, status post pacemaker, COPD with chronic respiratory failure on 4 L of oxygen, chronic kidney disease, was hospitalized back in February for acute and chronic diastolic congestive heart failure. She was diuresed with Lasix. She improved and was discharged to skilled nursing facility. She presents back with hypoxia with oxygen saturations in the 70s. She was hospitalized for further management.  Past medical history:  Past Medical History  Diagnosis Date  . Hypertension   . Chronic diastolic CHF (congestive heart failure) (HCC)     a. 03/2015: 2D ECHO: EF 60-65%, mild AS and AI, mildly dilated LA, mild diastolic dysfunction   . Parkinson's disease (HCC)   . COPD (chronic obstructive pulmonary disease) (HCC)     4L O2 baseline  . Heart block AV second degree     a. s/p Biotronik pulse generator: Serial # [id number] was successfully implanted on 04/12/15.   . S/P placement of cardiac pacemaker     a. Biotronik pulse generator: Serial # [id number] was successfully implanted on 04/12/15.   . Iron deficiency anemia     a. placed on iron and colase during 03/2016 admission   . CKD (chronic kidney disease)   . HCAP (healthcare-associated pneumonia) 06/26/2015    Consultants: None  Procedures: None  Antibiotics: None  Subjective:no dyspnea at rest. Has a cough, mostly dry. Chronic. No pain, nausea, vomiting. Poor appetite.  Objective: Vital Signs  Filed Vitals:   08/26/15 0416 08/26/15 0600 08/26/15 0739 08/26/15 0810  BP: 122/65 116/63 127/77   Pulse: 71 70 70 70  Temp: 98.6 F (37 C)  97.4 F (36.3 C)   TempSrc: Oral  Oral   Resp: 22 24 22 14   Height:      Weight: 63.6 kg (140 lb 3.4 oz)     SpO2: 97% 97% 99% 97%     Intake/Output Summary (Last 24 hours) at 08/26/15 0838 Last data filed at 08/26/15 [id number]  Gross per 24 hour  Intake    800 ml  Output    750 ml  Net     50 ml   Filed Weights   08/23/15 0419 08/24/15 0320 08/26/15 0416  Weight: 66.6 kg (146 lb 13.2 oz) 65.1 kg (143 lb 8.3 oz) 63.6 kg (140 lb 3.4 oz)    General appearance: alert, cooperative, appears stated age, distracted and no distress Resp:  Crackles at bases. No wheezing. No rhonchi.-  Cardio: regular rate and rhythm, S1, S2 normal, no murmur, click, rub or gallop GI: soft, non-tender; bowel sounds normal; no masses,  no organomegaly Extremities: Minimal pedal edema bilaterally. No erythema. Neurologic: Awake and alert. Distracted. No facial asymmetry. Motor strength is equal, symmetrical bilaterally.  Lab Results:  Basic Metabolic Panel:  Recent Labs Lab 08/22/15 1318 08/22/15 1816 08/23/15 0257 08/24/15 0234 08/25/15 0420 08/26/15 0350  NA 139  --  143 142 142 140  K 3.9  --  3.9 3.6 3.3* 4.3  CL 96*  --  97* 97* 94* 94*  CO2 31  --  36* 34* 38* 37*  GLUCOSE 133*  --  167* 141* 104* 98  BUN 20  --  20 21* 17 15  CREATININE 1.15* 1.13* 1.12* 1.05* 0.95 0.96  CALCIUM 9.2  --  9.0 9.0 8.7* 8.8*   CBC:  Recent Labs Lab 08/22/15 1318 08/22/15 1816 08/23/15 0257 08/24/15 0234 08/25/15 0420 08/26/15 0350  WBC 8.3 6.7 6.2 11.4* 8.3 7.7  NEUTROABS 6.3  --   --   --   --   --   HGB 10.7* 11.1* 10.0* 10.4* 10.8* 11.0*  HCT 34.9* 36.2 32.7* 34.2* 34.2* 35.9*  MCV 94.8 95.3 94.0 95.3 95.8 95.0  PLT 291 282 272 300 281 306   BNP (last 3 results)  Recent Labs  07/08/15 1018 08/22/15 1359 08/25/15 0333  BNP 327.8* 3377.7* 2417.7*     Recent Results (from the past 240 hour(s))  MRSA PCR Screening     Status: None   Collection Time: 08/22/15  5:51 PM  Result Value Ref Range Status   MRSA by PCR NEGATIVE NEGATIVE Final    Comment:        The GeneXpert MRSA Assay (FDA approved for NASAL  specimens only), is one component of a comprehensive MRSA colonization surveillance program. It is not intended to diagnose MRSA infection nor to guide or monitor treatment for MRSA infections.       Studies/Results: No results found.  Medications:  Scheduled: . aspirin EC  81 mg Oral Daily  . citalopram  20 mg Oral Daily  . docusate sodium  100 mg Oral BID  . enoxaparin (LOVENOX) injection  30 mg Subcutaneous Q24H  . ferrous sulfate  325 mg Oral Daily  . furosemide  40 mg Intravenous 3 times per day  . ipratropium-albuterol  3 mL Nebulization BID  . levothyroxine  88 mcg Oral QAC breakfast  . metoprolol  50 mg Oral BID  . potassium chloride  40 mEq Oral BID  . sodium chloride flush  3 mL Intravenous Q12H   Continuous:  ZOX:[id number] **OR** acetaminophen, albuterol, benzonatate, guaiFENesin-codeine, HYDROcodone-acetaminophen, LORazepam, polyethylene glycol, technetium TC 97M diethylenetriame-pentaacetic acid  Assessment/Plan:  Principal Problem:   CHF exacerbation (HCC) Active Problems:   Heart block AV second degree   Essential hypertension   Iron deficiency anemia   S/P placement of cardiac pacemaker   CKD (chronic kidney disease)   Diastolic CHF, acute on chronic (HCC)   Hypothyroidism   Acute on chronic respiratory failure with hypoxia (HCC)    Acute respiratory failure with hypoxia Thought to be secondary to acute diastolic CHF. BNP was significantly elevated.  Repeat chest x-ray  possible mild edema/effusions.  Patient was on high flow oxygen by nasal cannula.. VQ scan was low probability for PE. Decreased ventilation in the left lower lung is most likely due to the small pleural effusion. Palliative care consultation for goals of care, hospice eligibility - now on 7 LO2- Wean Lasix-   Acute diastolic congestive heart failure Echocardiogram report from November 2016 reviewed. Systolic function was noted to be normal. Please see above as well.  Monitor  ins and outs. Daily weights. Her weight is reducing. Continue IV Lasix aggressively for now. When she was discharged back in February she was down to 63.6 kg. She was supposed to have resumed her Lasix at her usual dose at the skilled nursing facility. She might need a higher dose of Lasix at discharge. -weaning Lasix ot Q12- back down to 63 kg today  History of COPD Patient does not have any wheezing currently. Continue her home medications. Does not appear to be acute COPD exacerbation.  Likely chronic kidney disease, unable to determine stage clinically Creatinine is stable and is better than what it was when she was discharged from the hospital,  but higher than her baseline. She likely does have chronic kidney disease, although is difficult to be certain. During her previous hospitalization her ARB was held. Celebrex was also held. Monitor creatinine closely while she is being diuresed.  History of cardiac pacemaker for AV block Paced Rhythm.  History of essential hypertension During her previous hospitalization amlodipine was discontinued due to low blood pressures. Unclear if this was resumed. Blood pressure is reasonably well controlled. She is noted to be on metoprolol.  Iron deficiency Anemia Stable. Continue iron  Hypothyroidism Continue home levothyroxine  Depression Continue citalopram   Reconsult palliative care medication to see the patient is eligible for hospice upon discharge  DVT Prophylaxis: Lovenox    Code Status: DO NOT RESUSCITATE  Family Communication:   Disposition Plan:  .  Pending palliative care consult     LOS: 4 days   [facility]  [email or web address], password [facility]  08/26/2015, 8:38 AM

## 2015-08-26 NOTE — NC FL2 (Signed)
[place] MEDICAID FL2 LEVEL OF CARE SCREENING TOOL     IDENTIFICATION  Patient Name: Summer Graham Birthdate: 11-01-1915 Sex: female Admission Date (Current Location): 08/22/2015  [facility] [place]   Facility and Address:  [facility] [place], [address], [place], [place] [address]      Provider Number: [id number]  Attending Physician Name and Address:  Calvert CantorSaima Rizwan, MD  Relative Name and Phone Number:  Bunnie PhilipsDanny Dea [phone]    Current Level of Care: Hospital Recommended Level of Care: Skilled Nursing Facility Prior Approval Number:    Date Approved/Denied:   PASRR Number:    Discharge Plan: SNF    Current Diagnoses: Patient Active Problem List   Diagnosis Date Noted  . Palliative care encounter   . DNR (do not resuscitate) discussion   . CHF exacerbation (HCC) 08/22/2015  . Acute on chronic respiratory failure with hypoxia (HCC) 08/22/2015  . Acute systolic heart failure (HCC) 08/22/2015  . Acute CHF (congestive heart failure) (HCC) 07/09/2015  . Diastolic CHF, acute on chronic (HCC) 07/08/2015  . Hypothyroidism 07/08/2015  . S/P placement of cardiac pacemaker   . CKD (chronic kidney disease)   . Essential hypertension 04/10/2015  . Iron deficiency anemia 04/10/2015  . Heart block AV second degree 04/08/2015    Orientation RESPIRATION BLADDER Height & Weight     Self, Time, Situation, Place  O2 (4L per minute) Incontinent Weight: 140 lb 3.4 oz (63.6 kg) Height:  5\' 3"  (160 cm)  BEHAVIORAL SYMPTOMS/MOOD NEUROLOGICAL BOWEL NUTRITION STATUS      Continent Diet (Carb modified)  AMBULATORY STATUS COMMUNICATION OF NEEDS Skin   Extensive Assist Verbally Surgical wounds                       Personal Care Assistance Level of Assistance  Bathing, Feeding, Dressing Bathing Assistance: Maximum assistance Feeding assistance: Maximum assistance Dressing Assistance: Maximum assistance     Functional Limitations  Info  Hearing   Hearing Info: Impaired      SPECIAL CARE FACTORS FREQUENCY                       Contractures      Additional Factors Info  Code Status, Allergies, Psychotropic Code Status Info: DNR Allergies Info: NKA Psychotropic Info: citalopram (CELEXA) tablet 20 mg         Current Medications (08/26/2015):  This is the current hospital active medication list Current Facility-Administered Medications  Medication Dose Route Frequency Provider Last Rate Last Dose  . acetaminophen (TYLENOL) tablet 650 mg  650 mg Oral Q6H PRN Alberteen Samhristopher P Danford, MD       Or  . acetaminophen (TYLENOL) suppository 650 mg  650 mg Rectal Q6H PRN Alberteen Samhristopher P Danford, MD      . albuterol (PROVENTIL) (2.5 MG/3ML) 0.083% nebulizer solution 2.5 mg  2.5 mg Nebulization Q6H PRN Alberteen Samhristopher P Danford, MD   2.5 mg at 08/25/15 0034  . aspirin EC tablet 81 mg  81 mg Oral Daily Alberteen Samhristopher P Danford, MD   81 mg at 08/26/15 1035  . benzonatate (TESSALON) capsule 100 mg  100 mg Oral TID PRN Alberteen Samhristopher P Danford, MD      . citalopram (CELEXA) tablet 20 mg  20 mg Oral Daily Alberteen Samhristopher P Danford, MD   20 mg at 08/26/15 1035  . docusate sodium (COLACE) capsule 100 mg  100 mg Oral BID Alberteen Samhristopher P Danford, MD  100 mg at 08/26/15 1035  . enoxaparin (LOVENOX) injection 30 mg  30 mg Subcutaneous Q24H Alberteen Sam, MD   30 mg at 08/25/15 2133  . ferrous sulfate tablet 325 mg  325 mg Oral Daily Alberteen Sam, MD   325 mg at 08/26/15 1035  . furosemide (LASIX) injection 40 mg  40 mg Intravenous BID Calvert Cantor, MD      . guaiFENesin-codeine 100-10 MG/5ML solution 5 mL  5 mL Oral Q6H PRN Alberteen Sam, MD   5 mL at 08/22/15 1831  . HYDROcodone-acetaminophen (NORCO/VICODIN) 5-325 MG per tablet 1 tablet  1 tablet Oral TID PRN Alberteen Sam, MD      . ipratropium-albuterol (DUONEB) 0.5-2.5 (3) MG/3ML nebulizer solution 3 mL  3 mL Nebulization BID Alberteen Sam, MD   3 mL  at 08/26/15 0810  . levothyroxine (SYNTHROID, LEVOTHROID) tablet 88 mcg  88 mcg Oral QAC breakfast Alberteen Sam, MD   88 mcg at 08/26/15 0850  . LORazepam (ATIVAN) tablet 0.25 mg  0.25 mg Oral QHS PRN Alberteen Sam, MD      . metoprolol (LOPRESSOR) tablet 50 mg  50 mg Oral BID Alberteen Sam, MD   50 mg at 08/26/15 1034  . polyethylene glycol (MIRALAX / GLYCOLAX) packet 17 g  17 g Oral BID PRN Alberteen Sam, MD      . potassium chloride SA (K-DUR,KLOR-CON) CR tablet 40 mEq  40 mEq Oral BID Richarda Overlie, MD   40 mEq at 08/26/15 1035  . sodium chloride flush (NS) 0.9 % injection 3 mL  3 mL Intravenous Q12H Alberteen Sam, MD   3 mL at 08/26/15 1035  . technetium TC 21M diethylenetriame-pentaacetic acid (DTPA) injection 30 milli Curie  30 milli Curie Inhalation Once PRN Osvaldo Shipper, MD         Discharge Medications: Please see discharge summary for a list of discharge medications.  Relevant Imaging Results:  Relevant Lab Results:   Additional Information SS# [id number]  [place]

## 2015-08-27 LAB — BASIC METABOLIC PANEL
Anion gap: 10 (ref 5–15)
BUN: 18 mg/dL (ref 6–20)
CHLORIDE: 93 mmol/L — AB (ref 101–111)
CO2: 36 mmol/L — AB (ref 22–32)
CREATININE: 1.2 mg/dL — AB (ref 0.44–1.00)
Calcium: 9 mg/dL (ref 8.9–10.3)
GFR calc Af Amer: 42 mL/min — ABNORMAL LOW (ref >60)
GFR calc non Af Amer: 37 mL/min — ABNORMAL LOW (ref >60)
GLUCOSE: 101 mg/dL — AB (ref 65–99)
POTASSIUM: 5 mmol/L (ref 3.5–5.1)
SODIUM: 139 mmol/L (ref 135–145)

## 2015-08-27 LAB — CBC
HEMATOCRIT: 38.8 % (ref 36.0–46.0)
Hemoglobin: 12 g/dL (ref 12.0–15.0)
MCH: 29.3 pg (ref 26.0–34.0)
MCHC: 30.9 g/dL (ref 30.0–36.0)
MCV: 94.6 fL (ref 78.0–100.0)
PLATELETS: 324 10*3/uL (ref 150–400)
RBC: 4.1 MIL/uL (ref 3.87–5.11)
RDW: 14.2 % (ref 11.5–15.5)
WBC: 6.4 10*3/uL (ref 4.0–10.5)

## 2015-08-27 MED ORDER — FUROSEMIDE 40 MG PO TABS
40.0000 mg | ORAL_TABLET | Freq: Two times a day (BID) | ORAL | Status: DC
  Administered 2015-08-27 – 2015-08-29 (×4): 40 mg via ORAL
  Filled 2015-08-27 (×4): qty 1

## 2015-08-27 NOTE — Progress Notes (Signed)
Report given to 5w16 nurse.

## 2015-08-27 NOTE — Plan of Care (Signed)
Problem: Activity: Goal: Capacity to carry out activities will improve Outcome: Progressing Patient able to turn from side to side and has increased strength.

## 2015-08-27 NOTE — Progress Notes (Addendum)
[facility] Progress Note   Summer Graham  ZOX:[id number]  DOB: 1916-03-22  DOA: 08/22/2015 PCP: Ginette OttoSTONEKING,HAL THOMAS, MD  Brief narrative: Summer Graham is a 82 y.o. female with a past medical history of diastolic congestive heart failure, atrial ventricular block, status post pacemaker, COPD with chronic respiratory failure on 4 L of oxygen, chronic kidney disease, was hospitalized back in February for acute and chronic diastolic congestive heart failure. She was diuresed with Lasix. She improved and was discharged to skilled nursing facility. She presents back with hypoxia with oxygen saturations in the 70s. She was hospitalized for further management   Subjective:  resting comfortably, denies sob   Assessment/Plan:  Acute respiratory failure with hypoxia Thought to be secondary to acute diastolic CHF? BNP was significantly elevated. Repeat chest x-ray possible mild edema/effusions. Patient was on high flow oxygen by nasal cannula.RT to evaluate if the patient can be switched to oximizer. VQ scan was low probability for PE. Decreased ventilation in the left lower lung is most likely due to the small pleural effusion. Palliative care consultation for goals of care- recommending d/c back to SNF with Palliative care (not full comfort care) - now on 10 LO2- see the patient can be switched to Oxymizer, or if this will be provided at SNF    Acute diastolic congestive heart failure - Echocardiogram report from November 2016 reviewed. Systolic function was noted to be normal.  - When she was discharged back in February she was down to 63.6 kg. She was supposed to have resumed her Lasix at her usual dose at the skilled nursing facility. She might need a higher dose of Lasix at discharge. Change Lasix to by mouth today    History of COPD Patient does not have any wheezing currently. Continue her home medications. Does not appear to be acute COPD exacerbation.  Likely chronic kidney  disease, unable to determine stage clinically Creatinine improved to 0.96. During her previous hospitalization her ARB was held. Celebrex was also held. Creatinine now trending up, 1.20  History of cardiac pacemaker for AV block Paced Rhythm.  History of essential hypertension During her previous hospitalization amlodipine was discontinued due to low blood pressures. Unclear if this was resumed. Blood pressure is reasonably well controlled.  -cont metoprolol.  Iron deficiency Anemia Stable. Continue iron  Hypothyroidism Continue home levothyroxine  Depression Continue citalopram     Antibiotics: Anti-infectives    None     Code Status:     Code Status Orders     DO NOT RESUSCITATE    Start     Ordered    Family Communication:  Disposition Plan: Transfer to telemetry, [facility]- family to meet with hospice at facility DVT prophylaxis: Lovenox  Consultants: palliative care Procedures: none    Objective: Filed Weights   08/24/15 0320 08/26/15 0416 08/27/15 0400  Weight: 65.1 kg (143 lb 8.3 oz) 63.6 kg (140 lb 3.4 oz) 62.2 kg (137 lb 2 oz)    Intake/Output Summary (Last 24 hours) at 08/27/15 0841 Last data filed at 08/26/15 2044  Gross per 24 hour  Intake    363 ml  Output      0 ml  Net    363 ml     Vitals Filed Vitals:   08/27/15 0100 08/27/15 0400 08/27/15 0700 08/27/15 0819  BP: 107/59 124/67 112/70   Pulse: 76 76 77   Temp:  97.6 F (36.4 C) 97.8 F (36.6 C)  TempSrc:  Oral Oral   Resp: Height:      Weight:  62.2 kg (137 lb 2 oz)    SpO2: 100% 98% 100% 96%    Exam:  General:  Pt is alert, not in acute distress, very hard of hearing  HEENT: No icterus, No thrush, oral mucosa moist  Cardiovascular: regular rate and rhythm, S1/S2 No murmur  Respiratory: clear to auscultation bilaterally- poor air entry  Abdomen: Soft, +Bowel sounds, non tender, non distended, no  guarding  MSK: No cyanosis or clubbing- no pedal edema   Data Reviewed: Basic Metabolic Panel:  Recent Labs Lab 08/23/15 0257 08/24/15 0234 08/25/15 0420 08/26/15 0350 08/27/15 0440  NA 143 142 142 140 139  K 3.9 3.6 3.3* 4.3 5.0  CL 97* 97* 94* 94* 93*  CO2 36* 34* 38* 37* 36*  GLUCOSE 167* 141* 104* 98 101*  BUN 20 21* CREATININE 1.12* 1.05* 0.95 0.96 1.20*  CALCIUM 9.0 9.0 8.7* 8.8* 9.0   Liver Function Tests:  Recent Labs Lab 08/26/15 0350  AST 20  ALT 13*  ALKPHOS 73  BILITOT 0.7  PROT 5.9*  ALBUMIN 2.7*   No results for input(s): LIPASE, AMYLASE in the last 168 hours. No results for input(s): AMMONIA in the last 168 hours. CBC:  Recent Labs Lab 08/22/15 1318  08/23/15 0257 08/24/15 0234 08/25/15 0420 08/26/15 0350 08/27/15 0440  WBC 8.3  < > 6.2 11.4* 8.3 7.7 6.4  NEUTROABS 6.3  --   --   --   --   --   --   HGB 10.7*  < > 10.0* 10.4* 10.8* 11.0* 12.0  HCT 34.9*  < > 32.7* 34.2* 34.2* 35.9* 38.8  MCV 94.8  < > 94.0 95.3 95.8 95.0 94.6  PLT 291  < > 272 300 281 306 324  < > = values in this interval not displayed. Cardiac Enzymes: No results for input(s): CKTOTAL, CKMB, CKMBINDEX, TROPONINI in the last 168 hours. BNP (last 3 results)  Recent Labs  07/08/15 1018 08/22/15 1359 08/25/15 0333  BNP 327.8* 3377.7* 2417.7*    ProBNP (last 3 results) No results for input(s): PROBNP in the last 8760 hours.  CBG: No results for input(s): GLUCAP in the last 168 hours.  Recent Results (from the past 240 hour(s))  MRSA PCR Screening     Status: None   Collection Time: 08/22/15  5:51 PM  Result Value Ref Range Status   MRSA by PCR NEGATIVE NEGATIVE Final    Comment:        The GeneXpert MRSA Assay (FDA approved for NASAL specimens only), is one component of a comprehensive MRSA colonization surveillance program. It is not intended to diagnose MRSA infection nor to guide or monitor treatment for MRSA infections.       Studies: No results found.  Scheduled Meds:  Scheduled Meds: . aspirin EC  81 mg Oral Daily  . citalopram  20 mg Oral Daily  . docusate sodium  100 mg Oral BID  . enoxaparin (LOVENOX) injection  30 mg Subcutaneous Q24H  . ferrous sulfate  325 mg Oral Daily  . furosemide  40 mg Intravenous BID  . ipratropium-albuterol  3 mL Nebulization BID  . levothyroxine  88 mcg Oral QAC breakfast  . metoprolol  50 mg Oral BID  . potassium chloride  40 mEq Oral BID  . sodium chloride flush  3 mL Intravenous Q12H   Continuous Infusions:  Time spent on care of this patient: 35 min   Zohar Laing, MD 08/27/2015, 8:41 AM  LOS: 5 days

## 2015-08-27 NOTE — Progress Notes (Signed)
Summer ProvencalBlanche Graham [id number]  Transfer Data: 08/27/2015 1:56 PM  Attending Provider: Richarda OverlieNayana Abrol, MD  WGN:[id number],HQIPCP:STONEKING,HAL Maisie FusHOMAS, MD  Code Status: DNR  Summer Graham is a 82 y.o. female patient transferred from 2H  -No acute distress noted.  -No complaints of shortness of breath.  -No complaints of chest pain.   Blood pressure 111/92, pulse 75, temperature 97.8 F (36.6 C), temperature source Oral, resp. rate 13, height 5\' 3"  (1.6 m), weight 62.2 kg (137 lb 2 oz), SpO2 99 %.  ?  IV Fluids: IV in place, occlusive dsg intact without redness, IV cath antecubital right, condition patent and no redness  none.  Allergies: Review of patient's allergies indicates no known allergies.  Past Medical History:  has a past medical history of Hypertension; Chronic diastolic CHF (congestive heart failure) (HCC); Parkinson's disease (HCC); COPD (chronic obstructive pulmonary disease) (HCC); Heart block AV second degree; S/P placement of cardiac pacemaker; Iron deficiency anemia; CKD (chronic kidney disease); and HCAP (healthcare-associated pneumonia) (06/26/2015).  Past Surgical History:  has past surgical history that includes Appendectomy; Total abdominal hysterectomy; and Cardiac catheterization (N/A, 04/12/2015).  Social History:  reports that she has never smoked. She does not have any smokeless tobacco history on file. She reports that she does not drink alcohol or use illicit drugs.  Skin: intact  Patient/Family orientated to room. Information packet given to patient/family. Admission inpatient armband information verified with patient/family to include name and date of birth and placed on patient arm. Side rails up x 2, fall assessment and education completed with patient/family. Patient/family able to verbalize understanding of risk associated with falls and verbalized understanding to call for assistance before getting out of bed. Call light within reach. Patient/family able to voice and demonstrate  understanding of unit orientation instructions.  Will continue to evaluate and treat per MD orders.

## 2015-08-27 NOTE — Progress Notes (Signed)
[facility] Spoke w sw bashira who will ck on this and get back w dr Susie Cassetteabrol. Any eq needed in hosp then hosp provides and any eq and snf the snf provides.

## 2015-08-27 NOTE — Clinical Social Work Note (Signed)
Clinical Social Worker notified by [employer], Junius Creamerebbie Dowell that patient will require oximizer due to O2 being at 10L. CSW contacted facility who reported they can readmit patient and order oximizer however [facility] be at [facility] if oxygen levels increase above 10L.   CSW has paged MD with this information. CSW remains available as needed.   Derenda FennelBashira Guynell Kleiber, MSW, LCSWA [phone] 08/27/2015 12:44 PM

## 2015-08-28 DIAGNOSIS — Z7189 Other specified counseling: Secondary | ICD-10-CM

## 2015-08-28 LAB — COMPREHENSIVE METABOLIC PANEL
ALT: 13 U/L — AB (ref 14–54)
ANION GAP: 8 (ref 5–15)
AST: 19 U/L (ref 15–41)
Albumin: 3 g/dL — ABNORMAL LOW (ref 3.5–5.0)
Alkaline Phosphatase: 71 U/L (ref 38–126)
BUN: 17 mg/dL (ref 6–20)
CALCIUM: 9.3 mg/dL (ref 8.9–10.3)
CHLORIDE: 95 mmol/L — AB (ref 101–111)
CO2: 36 mmol/L — AB (ref 22–32)
CREATININE: 1.24 mg/dL — AB (ref 0.44–1.00)
GFR, EST AFRICAN AMERICAN: 41 mL/min — AB (ref >60)
GFR, EST NON AFRICAN AMERICAN: 35 mL/min — AB (ref >60)
Glucose, Bld: 96 mg/dL (ref 65–99)
Potassium: 5.6 mmol/L — ABNORMAL HIGH (ref 3.5–5.1)
SODIUM: 139 mmol/L (ref 135–145)
Total Bilirubin: 0.8 mg/dL (ref 0.3–1.2)
Total Protein: 6 g/dL — ABNORMAL LOW (ref 6.5–8.1)

## 2015-08-28 LAB — CBC
HCT: 41.3 % (ref 36.0–46.0)
Hemoglobin: 12.7 g/dL (ref 12.0–15.0)
MCH: 28.9 pg (ref 26.0–34.0)
MCHC: 30.8 g/dL (ref 30.0–36.0)
MCV: 94.1 fL (ref 78.0–100.0)
PLATELETS: 361 10*3/uL (ref 150–400)
RBC: 4.39 MIL/uL (ref 3.87–5.11)
RDW: 13.9 % (ref 11.5–15.5)
WBC: 9.7 10*3/uL (ref 4.0–10.5)

## 2015-08-28 MED ORDER — FUROSEMIDE 40 MG PO TABS: 40.0000 mg | ORAL_TABLET | Freq: Two times a day (BID) | ORAL | Status: AC

## 2015-08-28 MED ORDER — LORAZEPAM 0.5 MG PO TABS: 0.5000 mg | ORAL_TABLET | Freq: Three times a day (TID) | ORAL | Status: AC | PRN

## 2015-08-28 MED ORDER — HYDROCODONE-ACETAMINOPHEN 5-325 MG PO TABS: 1.0000 | ORAL_TABLET | Freq: Three times a day (TID) | ORAL | Status: AC | PRN

## 2015-08-28 NOTE — Discharge Summary (Signed)
Physician Discharge Summary  Summer Graham MRN: [id number] DOB/AGE: 1916/05/24 82 y.o.  PCP: Mathews Argyle, MD   Admit date: 08/22/2015 Discharge date: 08/28/2015  Discharge Diagnoses:   Principal Problem:   CHF exacerbation ([place]) Active Problems:   Heart block AV second degree   Essential hypertension   Iron deficiency anemia   S/P placement of cardiac pacemaker   CKD (chronic kidney disease)   Diastolic CHF, acute on chronic (HCC)   Hypothyroidism   Acute on chronic respiratory failure with hypoxia ([place])   Palliative care encounter   DNR (do not resuscitate) discussion    Follow-up recommendations Follow-up with PCP in 3-5 days , including all  additional recommended appointments as below Follow-up CBC, CMP in 3-5 days Patient is being discharged to skilled nursing facility , recommend that the patient is followed by hospice services while she is at the facility, prognosis less than 6 months Wean oxygen to keep oxygen saturation greater than 85% Continue aggressive pulmonary toilet, incentive spirometry     Medication List    TAKE these medications        acetaminophen 500 MG tablet  Commonly known as:  TYLENOL  Take 500 mg by mouth every 6 (six) hours as needed for mild pain.     albuterol (2.5 MG/3ML) 0.083% nebulizer solution  Commonly known as:  PROVENTIL  Take 3 mLs (2.5 mg total) by nebulization every 6 (six) hours as needed for wheezing or shortness of breath.     aspirin EC 81 MG tablet  Take 81 mg by mouth daily.     benzonatate 100 MG capsule  Commonly known as:  TESSALON  Take 100 mg by mouth 3 (three) times daily as needed for cough.     citalopram 20 MG tablet  Commonly known as:  CELEXA  Take 20 mg by mouth daily.     docusate sodium 100 MG capsule  Commonly known as:  COLACE  Take 100 mg by mouth 2 (two) times daily.     ferrous sulfate 325 (65 FE) MG tablet  Take 325 mg by mouth daily.     furosemide 40 MG tablet  Commonly  known as:  LASIX  Take 1 tablet (40 mg total) by mouth 2 (two) times daily.     guaiFENesin-codeine 100-10 MG/5ML syrup  Commonly known as:  ROBITUSSIN AC  Take 5 mLs by mouth every 6 (six) hours as needed for cough.     HYDROcodone-acetaminophen 5-325 MG tablet  Commonly known as:  NORCO/VICODIN  Take 1 tablet by mouth 3 (three) times daily as needed for moderate pain.     ipratropium-albuterol 0.5-2.5 (3) MG/3ML Soln  Commonly known as:  DUONEB  Take 3 mLs by nebulization 2 (two) times daily.     levothyroxine 88 MCG tablet  Commonly known as:  SYNTHROID, LEVOTHROID  Take 88 mcg by mouth daily before breakfast.     LORazepam 0.5 MG tablet  Commonly known as:  ATIVAN  Take 1 tablet (0.5 mg total) by mouth every 8 (eight) hours as needed for sleep. Take HALF tablet by mouth, at bedtime, as needed for sleep.     metoprolol 50 MG tablet  Commonly known as:  LOPRESSOR  Take 1 tablet (50 mg total) by mouth 2 (two) times daily.     multivitamin with minerals Tabs tablet  Take 1 tablet by mouth daily.     omeprazole 20 MG capsule  Commonly known as:  PRILOSEC  Take 20 mg by mouth  daily.     polyethylene glycol packet  Commonly known as:  MIRALAX / GLYCOLAX  Take 17 g by mouth 2 (two) times daily as needed for mild constipation.     vitamin B-12 1000 MCG tablet  Commonly known as:  CYANOCOBALAMIN  Take 1,000 mcg by mouth daily.     vitamin C 500 MG tablet  Commonly known as:  ASCORBIC ACID  Take 500 mg by mouth daily.     VITAMIN D3 SUPER STRENGTH 2000 units Caps  Generic drug:  Cholecalciferol  Take 2,000 Units by mouth daily.         Discharge Condition: Prognosis poor  Discharge Instructions Get Medicines reviewed and adjusted: Please take all your medications with you for your next visit with your Primary MD  Please request your Primary MD to go over all hospital tests and procedure/radiological results at the follow up, please ask your Primary MD to get all  Hospital records sent to his/her office.  If you experience worsening of your admission symptoms, develop shortness of breath, life threatening emergency, suicidal or homicidal thoughts you must seek medical attention immediately by calling 911 or calling your MD immediately if symptoms less severe.  You must read complete instructions/literature along with all the possible adverse reactions/side effects for all the Medicines you take and that have been prescribed to you. Take any new Medicines after you have completely understood and accpet all the possible adverse reactions/side effects.   Do not drive when taking Pain medications.   Do not take more than prescribed Pain, Sleep and Anxiety Medications  Special Instructions: If you have smoked or chewed Tobacco in the last 2 yrs please stop smoking, stop any regular Alcohol and or any Recreational drug use.  Wear Seat belts while driving.  Please note  You were cared for by a hospitalist during your hospital stay. Once you are discharged, your primary care physician will handle any further medical issues. Please note that NO REFILLS for any discharge medications will be authorized once you are discharged, as it is imperative that you return to your primary care physician (or establish a relationship with a primary care physician if you do not have one) for your aftercare needs so that they can reassess your need for medications and monitor your lab values.      Discharge Instructions    Diet - low sodium heart healthy    Complete by:  As directed      Increase activity slowly    Complete by:  As directed            No Known Allergies    Disposition: 03-Skilled Nursing Facility   Consults:  Palliative care     Significant Diagnostic Studies:  Nm Pulmonary Perf And Vent  08/23/2015  CLINICAL DATA:  Hypoxia with oxygen saturations in the 25K percent, diastolic CHF, AV block, COPD, respiratory failure, chronic kidney  disease, hypertension EXAM: NUCLEAR MEDICINE VENTILATION - PERFUSION LUNG SCAN TECHNIQUE: Ventilation images were obtained in multiple projections using inhaled aerosol Tc-68mDTPA. Perfusion images were obtained in multiple projections after intravenous injection of Tc-958mAA. RADIOPHARMACEUTICALS:  30.0 mCi Technetium-9981mPA aerosol inhalation and 4.2 mCi Technetium-6m20m IV COMPARISON:  None Radiographic correlation: Chest radiograph 08/22/2015, CTA chest 06/26/2015 FINDINGS: Ventilation: Lobar diminished ventilation LEFT lower lobe. Central airway deposition of tracer. Perfusion: Diminished perfusion LEFT lower lobe though less severely impaired than ventilation. Mass effect on LEFT lower lobe by enlargement of heart. No other perfusion defects.  Chest radiograph: Enlargement of cardiac silhouette and pulmonary vascular congestion post pacemaker placement. IMPRESSION: Diminished ventilation and less severely impaired diminished perfusion in LEFT lower lobe. Findings favor parenchymal lung disease and represent a low probability for pulmonary embolism. Electronically Signed   By: Lavonia Dana M.D.   On: 08/23/2015 16:01   Dg Chest Port 1 View  08/24/2015  CLINICAL DATA:  Shortness of breath. EXAM: PORTABLE CHEST 1 VIEW COMPARISON:  08/22/2015. FINDINGS: Cardiac pacer with lead tips in right atrium right ventricle. Stable cardiomegaly. Low lung volumes with mild bibasilar atelectasis and or infiltrates/edema. Small left pleural effusion cannot be excluded. No pneumothorax. IMPRESSION: 1. Cardiac pacer and stable position.  Stable cardiomegaly. 2. Lung volumes with mild bibasilar atelectasis. Mild bibasilar infiltrates and or edema cannot be excluded on today's exam. Small left pleural effusion cannot be excluded . Electronically Signed   By: Marcello Moores  Register   On: 08/24/2015 07:55   Dg Chest Port 1 View  08/22/2015  CLINICAL DATA:  Shortness of breath, hypoxia EXAM: PORTABLE CHEST 1 VIEW COMPARISON:   07/08/2015 FINDINGS: Low lung volumes. No frank interstitial edema or focal consolidation. No definite pleural effusion.  No pneumothorax. Cardiomegaly.  Left subclavian pacemaker. IMPRESSION: Low lung volumes. No evidence of acute cardiopulmonary disease. Electronically Signed   By: Julian Hy M.D.   On: 08/22/2015 13:34       2-D echo    Filed Weights   08/26/15 0416 08/27/15 0400 08/28/15 0606  Weight: 63.6 kg (140 lb 3.4 oz) 62.2 kg (137 lb 2 oz) 60.2 kg (132 lb 11.5 oz)     Microbiology: Recent Results (from the past 240 hour(s))  MRSA PCR Screening     Status: None   Collection Time: 08/22/15  5:51 PM  Result Value Ref Range Status   MRSA by PCR NEGATIVE NEGATIVE Final    Comment:        The GeneXpert MRSA Assay (FDA approved for NASAL specimens only), is one component of a comprehensive MRSA colonization surveillance program. It is not intended to diagnose MRSA infection nor to guide or monitor treatment for MRSA infections.        Blood Culture    Component Value Date/Time   SDES SPUTUM 06/27/2015 1019   SDES SPUTUM 06/27/2015 1019   SPECREQUEST NONE 06/27/2015 1019   SPECREQUEST NONE 06/27/2015 1019   CULT  06/27/2015 1019    NORMAL OROPHARYNGEAL FLORA Performed at [facility] 06/27/2015 FINAL 06/27/2015 1019   REPTSTATUS 06/30/2015 FINAL 06/27/2015 1019      Labs: Results for orders placed or performed during the hospital encounter of 08/22/15 (from the past 48 hour(s))  Basic metabolic panel     Status: Abnormal   Collection Time: 08/27/15  4:40 AM  Result Value Ref Range   Sodium 139 135 - 145 mmol/L   Potassium 5.0 3.5 - 5.1 mmol/L   Chloride 93 (L) 101 - 111 mmol/L   CO2 36 (H) 22 - 32 mmol/L   Glucose, Bld 101 (H) 65 - 99 mg/dL   BUN 18 6 - 20 mg/dL   Creatinine, Ser 1.20 (H) 0.44 - 1.00 mg/dL   Calcium 9.0 8.9 - 10.3 mg/dL   GFR calc non Af Amer 37 (L) >60 mL/min   GFR calc Af Amer 42 (L) >60 mL/min     Comment: (NOTE) The eGFR has been calculated using the CKD EPI equation. This calculation has not been validated in all clinical situations. eGFR's  persistently <60 mL/min signify possible Chronic Kidney Disease.    Anion gap 10 5 - 15  CBC     Status: None   Collection Time: 08/27/15  4:40 AM  Result Value Ref Range   WBC 6.4 4.0 - 10.5 K/uL   RBC 4.10 3.87 - 5.11 MIL/uL   Hemoglobin 12.0 12.0 - 15.0 g/dL   HCT 38.8 36.0 - 46.0 %   MCV 94.6 78.0 - 100.0 fL   MCH 29.3 26.0 - 34.0 pg   MCHC 30.9 30.0 - 36.0 g/dL   RDW 14.2 11.5 - 15.5 %   Platelets 324 150 - 400 K/uL  CBC     Status: None   Collection Time: 08/28/15  6:56 AM  Result Value Ref Range   WBC 9.7 4.0 - 10.5 K/uL   RBC 4.39 3.87 - 5.11 MIL/uL   Hemoglobin 12.7 12.0 - 15.0 g/dL   HCT 41.3 36.0 - 46.0 %   MCV 94.1 78.0 - 100.0 fL   MCH 28.9 26.0 - 34.0 pg   MCHC 30.8 30.0 - 36.0 g/dL   RDW 13.9 11.5 - 15.5 %   Platelets 361 150 - 400 K/uL  Comprehensive metabolic panel     Status: Abnormal   Collection Time: 08/28/15  6:56 AM  Result Value Ref Range   Sodium 139 135 - 145 mmol/L   Potassium 5.6 (H) 3.5 - 5.1 mmol/L   Chloride 95 (L) 101 - 111 mmol/L   CO2 36 (H) 22 - 32 mmol/L   Glucose, Bld 96 65 - 99 mg/dL   BUN 17 6 - 20 mg/dL   Creatinine, Ser 1.24 (H) 0.44 - 1.00 mg/dL   Calcium 9.3 8.9 - 10.3 mg/dL   Total Protein 6.0 (L) 6.5 - 8.1 g/dL   Albumin 3.0 (L) 3.5 - 5.0 g/dL   AST 19 15 - 41 U/L   ALT 13 (L) 14 - 54 U/L   Alkaline Phosphatase 71 38 - 126 U/L   Total Bilirubin 0.8 0.3 - 1.2 mg/dL   GFR calc non Af Amer 35 (L) >60 mL/min   GFR calc Af Amer 41 (L) >60 mL/min    Comment: (NOTE) The eGFR has been calculated using the CKD EPI equation. This calculation has not been validated in all clinical situations. eGFR's persistently <60 mL/min signify possible Chronic Kidney Disease.    Anion gap 8 5 - 15     Lipid Panel  No results found for: CHOL, TRIG, HDL, CHOLHDL, VLDL, LDLCALC,  LDLDIRECT   No results found for: HGBA1C   Lab Results  Component Value Date   CREATININE 1.24* 08/28/2015     HPI :* 82 yo female with a history of COPD, (O2 dependant), CKD, HCAP in January 2017, and CHF with AVB and pacer. She was brought to the ED for dyspnea, increased swelling and SOB and conintued cought. She was noted to have BNP of 3300. History of COPD with chronic respiratory failure on 4 L of oxygen, chronic kidney disease, was hospitalized back in February for acute and chronic diastolic congestive heart failure. She was diuresed with Lasix. She improved and was discharged to skilled nursing facility. She presents back with hypoxia with oxygen saturations in the 70s. She was hospitalized for further management   HOSPITAL COURSE:  Acute respiratory failure with hypoxia Thought to be secondary to acute diastolic CHF? BNP was significantly elevated. Serial chest x-ray possible mild edema/effusions. Patient Has required high flow oxygen Up to 10 L  by nasal cannula. RT to evaluate if the patient can be switched to oximizer. VQ scan was low probability for PE. Decreased ventilation in the left lower lung is most likely due to the small pleural effusion. Palliative care consultation for goals of care- recommending d/c back to SNF with Palliative care -Discussed with son and he is agreeable to hospice services - now on 10 LO2- see the patient can be switched to Oxymizer,Targetoxygen saturation about 85%, Expected to wean off High flow oxygen with improvement in pulmonary toilet   Acute diastolic congestive heart failure - Echocardiogram report from November 2016 reviewed. Systolic function was noted to be normal.  - When she was discharged back in February she was down to 63.6 kg. She was supposed to have resumed her Lasix at her usual dose at the skilled nursing facility.  Home dose of Lasix increased to 40 mg twice a day  History of COPD Patient does not have any wheezing  currently. Continue her home medications. Does not appear to be acute COPD exacerbation.  Likely chronic kidney disease, unable to determine stage clinically Baseline creatinine around 1.1, creatinine 1.24 on the day of discharge. During her previous hospitalization her ARB was held. Celebrex was also held.    History of cardiac pacemaker for AV block Paced Rhythm.  History of essential hypertension During her previous hospitalization amlodipine was discontinued due to low blood pressures. Unclear if this was resumed. Blood pressure is reasonably well controlled.  -cont metoprolol.  Iron deficiency Anemia Stable. Continue iron  Hypothyroidism Continue home levothyroxine  Depression Continue citalopram       Discharge Exam:    Blood pressure 114/64, pulse 80, temperature 98 F (36.7 C), temperature source Oral, resp. rate 22, height 5' 3"  (1.6 m), weight 60.2 kg (132 lb 11.5 oz), SpO2 94 %.      Follow-up Information    Follow up with Mathews Argyle, MD. Schedule an appointment as soon as possible for a visit in 3 days.   Specialty:  Internal Medicine   Contact information:   [address] [place] [address] [phone]       Signed: Reyne Dumas 08/28/2015, 12:23 PM        Time spent >45 mins

## 2015-08-28 NOTE — Clinical Social Work Note (Signed)
Clinical Social Worker continuing to follow patient and family for support and discharge planning needs.  CSW contacted [facility] who states that they will require discharge summary today in order to order the oximizer and have patient admit tomorrow - discharge summary faxed and patient and family notified by RN.  CSW remains available for support and to facilitate patient discharge needs.  Macario GoldsJesse Audon Heymann, [place] [phone]

## 2015-08-29 DIAGNOSIS — N182 Chronic kidney disease, stage 2 (mild): Secondary | ICD-10-CM

## 2015-08-29 LAB — CREATININE, SERUM
CREATININE: 1.33 mg/dL — AB (ref 0.44–1.00)
GFR, EST AFRICAN AMERICAN: 38 mL/min — AB (ref >60)
GFR, EST NON AFRICAN AMERICAN: 32 mL/min — AB (ref >60)

## 2015-08-29 NOTE — Progress Notes (Signed)
[name] at [facility], facility doesn't need oximizer in order to take Pt, as they can put her on 10 liters on a cannula.  They have ordered the pendant oximizer, per family request, and anticipate that it will arrive tomorrow.  Relayed the above information to RN.  RN relayed the information, while on the phone with MSW, to Pt and family in the room.  Family voiced an understanding.  Arranged for ambulance transport.  RN to give report.  Pt to be d/c'd.  Providence CrosbyAmanda Gamaliel Charney, LCSW Clinical Social Work [phone]

## 2015-08-29 NOTE — Discharge Summary (Addendum)
Physician Discharge Summary  Summer Graham MRN: [id number] DOB/AGE: Sep 18, 1915 82 y.o.  PCP: Mathews Argyle, MD   Admit date: 08/22/2015 Discharge date: 08/29/2015  Discharge Diagnoses:   Principal Problem:   CHF exacerbation ([place]) Active Problems:   Heart block AV second degree   Essential hypertension   Iron deficiency anemia   S/P placement of cardiac pacemaker   CKD (chronic kidney disease)   Diastolic CHF, acute on chronic (HCC)   Hypothyroidism   Acute on chronic respiratory failure with hypoxia ([place])   Palliative care encounter   DNR (do not resuscitate) discussion    Follow-up recommendations Follow-up with PCP in 3-5 days , including all  additional recommended appointments as below Follow-up CBC, CMP in 3-5 days Patient is being discharged to skilled nursing facility , recommend that the patient is followed by hospice services while she is at the facility, prognosis less than 6 months Wean oxygen to keep oxygen saturation greater than 85% Continue aggressive pulmonary toilet, incentive spirometry     Medication List    TAKE these medications        acetaminophen 500 MG tablet  Commonly known as:  TYLENOL  Take 500 mg by mouth every 6 (six) hours as needed for mild pain.     albuterol (2.5 MG/3ML) 0.083% nebulizer solution  Commonly known as:  PROVENTIL  Take 3 mLs (2.5 mg total) by nebulization every 6 (six) hours as needed for wheezing or shortness of breath.     aspirin EC 81 MG tablet  Take 81 mg by mouth daily.     benzonatate 100 MG capsule  Commonly known as:  TESSALON  Take 100 mg by mouth 3 (three) times daily as needed for cough.     citalopram 20 MG tablet  Commonly known as:  CELEXA  Take 20 mg by mouth daily.     docusate sodium 100 MG capsule  Commonly known as:  COLACE  Take 100 mg by mouth 2 (two) times daily.     ferrous sulfate 325 (65 FE) MG tablet  Take 325 mg by mouth daily.     furosemide 40 MG tablet  Commonly  known as:  LASIX  Take 1 tablet (40 mg total) by mouth 2 (two) times daily.     guaiFENesin-codeine 100-10 MG/5ML syrup  Commonly known as:  ROBITUSSIN AC  Take 5 mLs by mouth every 6 (six) hours as needed for cough.     HYDROcodone-acetaminophen 5-325 MG tablet  Commonly known as:  NORCO/VICODIN  Take 1 tablet by mouth 3 (three) times daily as needed for moderate pain.     ipratropium-albuterol 0.5-2.5 (3) MG/3ML Soln  Commonly known as:  DUONEB  Take 3 mLs by nebulization 2 (two) times daily.     levothyroxine 88 MCG tablet  Commonly known as:  SYNTHROID, LEVOTHROID  Take 88 mcg by mouth daily before breakfast.     LORazepam 0.5 MG tablet  Commonly known as:  ATIVAN  Take 1 tablet (0.5 mg total) by mouth every 8 (eight) hours as needed for sleep. Take HALF tablet by mouth, at bedtime, as needed for sleep.     metoprolol 50 MG tablet  Commonly known as:  LOPRESSOR  Take 1 tablet (50 mg total) by mouth 2 (two) times daily.     multivitamin with minerals Tabs tablet  Take 1 tablet by mouth daily.     omeprazole 20 MG capsule  Commonly known as:  PRILOSEC  Take 20 mg by mouth  daily.     polyethylene glycol packet  Commonly known as:  MIRALAX / GLYCOLAX  Take 17 g by mouth 2 (two) times daily as needed for mild constipation.     vitamin B-12 1000 MCG tablet  Commonly known as:  CYANOCOBALAMIN  Take 1,000 mcg by mouth daily.     vitamin C 500 MG tablet  Commonly known as:  ASCORBIC ACID  Take 500 mg by mouth daily.     VITAMIN D3 SUPER STRENGTH 2000 units Caps  Generic drug:  Cholecalciferol  Take 2,000 Units by mouth daily.         Discharge Condition: Prognosis poor  Discharge Instructions Get Medicines reviewed and adjusted: Please take all your medications with you for your next visit with your Primary MD  Please request your Primary MD to go over all hospital tests and procedure/radiological results at the follow up, please ask your Primary MD to get all  Hospital records sent to his/her office.  If you experience worsening of your admission symptoms, develop shortness of breath, life threatening emergency, suicidal or homicidal thoughts you must seek medical attention immediately by calling 911 or calling your MD immediately if symptoms less severe.  You must read complete instructions/literature along with all the possible adverse reactions/side effects for all the Medicines you take and that have been prescribed to you. Take any new Medicines after you have completely understood and accpet all the possible adverse reactions/side effects.   Do not drive when taking Pain medications.   Do not take more than prescribed Pain, Sleep and Anxiety Medications  Special Instructions: If you have smoked or chewed Tobacco in the last 2 yrs please stop smoking, stop any regular Alcohol and or any Recreational drug use.  Wear Seat belts while driving.  Please note  You were cared for by a hospitalist during your hospital stay. Once you are discharged, your primary care physician will handle any further medical issues. Please note that NO REFILLS for any discharge medications will be authorized once you are discharged, as it is imperative that you return to your primary care physician (or establish a relationship with a primary care physician if you do not have one) for your aftercare needs so that they can reassess your need for medications and monitor your lab values.  Discharge Instructions    Diet - low sodium heart healthy    Complete by:  As directed      Increase activity slowly    Complete by:  As directed            No Known Allergies    Disposition: 03-Skilled Nursing Facility   Consults:  Palliative care     Significant Diagnostic Studies:  Nm Pulmonary Perf And Vent  08/23/2015  CLINICAL DATA:  Hypoxia with oxygen saturations in the 33H percent, diastolic CHF, AV block, COPD, respiratory failure, chronic kidney disease,  hypertension EXAM: NUCLEAR MEDICINE VENTILATION - PERFUSION LUNG SCAN TECHNIQUE: Ventilation images were obtained in multiple projections using inhaled aerosol Tc-41mDTPA. Perfusion images were obtained in multiple projections after intravenous injection of Tc-974mAA. RADIOPHARMACEUTICALS:  30.0 mCi Technetium-997mPA aerosol inhalation and 4.2 mCi Technetium-36m85m IV COMPARISON:  None Radiographic correlation: Chest radiograph 08/22/2015, CTA chest 06/26/2015 FINDINGS: Ventilation: Lobar diminished ventilation LEFT lower lobe. Central airway deposition of tracer. Perfusion: Diminished perfusion LEFT lower lobe though less severely impaired than ventilation. Mass effect on LEFT lower lobe by enlargement of heart. No other perfusion defects. Chest radiograph: Enlargement of  cardiac silhouette and pulmonary vascular congestion post pacemaker placement. IMPRESSION: Diminished ventilation and less severely impaired diminished perfusion in LEFT lower lobe. Findings favor parenchymal lung disease and represent a low probability for pulmonary embolism. Electronically Signed   By: Lavonia Dana M.D.   On: 08/23/2015 16:01   Dg Chest Port 1 View  08/24/2015  CLINICAL DATA:  Shortness of breath. EXAM: PORTABLE CHEST 1 VIEW COMPARISON:  08/22/2015. FINDINGS: Cardiac pacer with lead tips in right atrium right ventricle. Stable cardiomegaly. Low lung volumes with mild bibasilar atelectasis and or infiltrates/edema. Small left pleural effusion cannot be excluded. No pneumothorax. IMPRESSION: 1. Cardiac pacer and stable position.  Stable cardiomegaly. 2. Lung volumes with mild bibasilar atelectasis. Mild bibasilar infiltrates and or edema cannot be excluded on today's exam. Small left pleural effusion cannot be excluded . Electronically Signed   By: Marcello Moores  Register   On: 08/24/2015 07:55   Dg Chest Port 1 View  08/22/2015  CLINICAL DATA:  Shortness of breath, hypoxia EXAM: PORTABLE CHEST 1 VIEW COMPARISON:  07/08/2015  FINDINGS: Low lung volumes. No frank interstitial edema or focal consolidation. No definite pleural effusion.  No pneumothorax. Cardiomegaly.  Left subclavian pacemaker. IMPRESSION: Low lung volumes. No evidence of acute cardiopulmonary disease. Electronically Signed   By: Julian Hy M.D.   On: 08/22/2015 13:34       2-D echo    Filed Weights   08/27/15 0400 08/28/15 0606 08/29/15 0512  Weight: 62.2 kg (137 lb 2 oz) 60.2 kg (132 lb 11.5 oz) 60.7 kg (133 lb 13.1 oz)     Microbiology: Recent Results (from the past 240 hour(s))  MRSA PCR Screening     Status: None   Collection Time: 08/22/15  5:51 PM  Result Value Ref Range Status   MRSA by PCR NEGATIVE NEGATIVE Final    Comment:        The GeneXpert MRSA Assay (FDA approved for NASAL specimens only), is one component of a comprehensive MRSA colonization surveillance program. It is not intended to diagnose MRSA infection nor to guide or monitor treatment for MRSA infections.        Blood Culture    Component Value Date/Time   SDES SPUTUM 06/27/2015 1019   SDES SPUTUM 06/27/2015 1019   SPECREQUEST NONE 06/27/2015 1019   SPECREQUEST NONE 06/27/2015 1019   CULT  06/27/2015 1019    NORMAL OROPHARYNGEAL FLORA Performed at [facility] 06/27/2015 FINAL 06/27/2015 1019   REPTSTATUS 06/30/2015 FINAL 06/27/2015 1019      Labs: Results for orders placed or performed during the hospital encounter of 08/22/15 (from the past 48 hour(s))  CBC     Status: None   Collection Time: 08/28/15  6:56 AM  Result Value Ref Range   WBC 9.7 4.0 - 10.5 K/uL   RBC 4.39 3.87 - 5.11 MIL/uL   Hemoglobin 12.7 12.0 - 15.0 g/dL   HCT 41.3 36.0 - 46.0 %   MCV 94.1 78.0 - 100.0 fL   MCH 28.9 26.0 - 34.0 pg   MCHC 30.8 30.0 - 36.0 g/dL   RDW 13.9 11.5 - 15.5 %   Platelets 361 150 - 400 K/uL  Comprehensive metabolic panel     Status: Abnormal   Collection Time: 08/28/15  6:56 AM  Result Value Ref Range    Sodium 139 135 - 145 mmol/L   Potassium 5.6 (H) 3.5 - 5.1 mmol/L   Chloride 95 (L) 101 - 111 mmol/L  CO2 36 (H) 22 - 32 mmol/L   Glucose, Bld 96 65 - 99 mg/dL   BUN 17 6 - 20 mg/dL   Creatinine, Ser 1.24 (H) 0.44 - 1.00 mg/dL   Calcium 9.3 8.9 - 10.3 mg/dL   Total Protein 6.0 (L) 6.5 - 8.1 g/dL   Albumin 3.0 (L) 3.5 - 5.0 g/dL   AST 19 15 - 41 U/L   ALT 13 (L) 14 - 54 U/L   Alkaline Phosphatase 71 38 - 126 U/L   Total Bilirubin 0.8 0.3 - 1.2 mg/dL   GFR calc non Af Amer 35 (L) >60 mL/min   GFR calc Af Amer 41 (L) >60 mL/min    Comment: (NOTE) The eGFR has been calculated using the CKD EPI equation. This calculation has not been validated in all clinical situations. eGFR's persistently <60 mL/min signify possible Chronic Kidney Disease.    Anion gap 8 5 - 15  Creatinine, serum     Status: Abnormal   Collection Time: 08/29/15  5:35 AM  Result Value Ref Range   Creatinine, Ser 1.33 (H) 0.44 - 1.00 mg/dL   GFR calc non Af Amer 32 (L) >60 mL/min   GFR calc Af Amer 38 (L) >60 mL/min    Comment: (NOTE) The eGFR has been calculated using the CKD EPI equation. This calculation has not been validated in all clinical situations. eGFR's persistently <60 mL/min signify possible Chronic Kidney Disease.      Lipid Panel  No results found for: CHOL, TRIG, HDL, CHOLHDL, VLDL, LDLCALC, LDLDIRECT   No results found for: HGBA1C   Lab Results  Component Value Date   CREATININE 1.33* 08/29/2015     HPI :* 82 yo female with a history of COPD, (O2 dependant), CKD, HCAP in January 2017, and CHF with AVB and pacer. She was brought to the ED for dyspnea, increased swelling and SOB and conintued cought. She was noted to have BNP of 3300. History of COPD with chronic respiratory failure on 4 L of oxygen, chronic kidney disease, was hospitalized back in February for acute and chronic diastolic congestive heart failure. She was diuresed with Lasix. She improved and was discharged to skilled  nursing facility. She presents back with hypoxia with oxygen saturations in the 70s. She was hospitalized for further management   HOSPITAL COURSE:  Acute respiratory failure with hypoxia Thought to be secondary to acute diastolic CHF? BNP was significantly elevated. Serial chest x-ray possible mild edema/effusions. Patient Has required high flow oxygen Up to 10 L by nasal cannula. RT to evaluate if the patient can be switched to oximizer. VQ scan was low probability for PE. Decreased ventilation in the left lower lung is most likely due to the small pleural effusion. Palliative care consultation for goals of care- recommending d/c back to SNF with Palliative care -Discussed with son and he is agreeable to hospice services - now on 10 LO2- see the patient can be switched to Oxymizer, goal oxygen saturation about 85%, Expected to wean off High flow oxygen with improvement in pulmonary toilet   Acute diastolic congestive heart failure - Echocardiogram report from November 2016 reviewed. Systolic function was noted to be normal.  - When she was discharged back in February she was down to 63.6 kg. She was supposed to have resumed her Lasix at her usual dose at the skilled nursing facility.  Home dose of Lasix increased to 40 mg twice a day   History of COPD Patient does not have  any wheezing currently. Continue her home medications. Does not appear to be acute COPD exacerbation.  Likely chronic kidney disease, unable to determine stage clinically Baseline creatinine around 1.1, creatinine 1.24 on the day of discharge. During her previous hospitalization her ARB was held. Celebrex was also held.    History of cardiac pacemaker for AV block Paced Rhythm.  History of essential hypertension During her previous hospitalization amlodipine was discontinued due to low blood pressures. Unclear if this was resumed. Blood pressure is reasonably well controlled.  -cont metoprolol.  Iron  deficiency Anemia Stable. Continue iron  Hypothyroidism Continue home levothyroxine  Depression Continue citalopram    Discharge Exam:    Blood pressure 124/66, pulse 81, temperature 98.3 F (36.8 C), temperature source Oral, resp. rate 16, height 5' 3"  (1.6 m), weight 60.7 kg (133 lb 13.1 oz), SpO2 94 %.  General appearance: Frail elderly adult female, sleepy but in moderate distress from dyspnea, on BiPAP.  Eyes: Anicteric, conjunctiva pink, lids and lashes normal.  ENT: No nasal deformity, discharge, or epistaxis. OP moist without lesions.  Lymph: No cervical or supraclavicular lymphadenopathy. Skin: Warm and dry. No jaundice. No suspicious rashes or lesions. Cardiac: Tachycardic regular, nl S1-S2, no murmurs appreciated on my exam. Capillary refill is brisk. JVs distended. 1+ LE edema. Radial pulses 2+ and symmetric. Respiratory: On BiPAP. Coarse breath sounds, as of airway edema, bilaterally. Abdomen: Abdomen soft without rigidity. No TTP.No ascites, distension    Follow-up Information    Follow up with Mathews Argyle, MD. Schedule an appointment as soon as possible for a visit in 3 days.   Specialty:  Internal Medicine   Contact information:   [address] [place] [address] [phone]       Signed: Reyne Dumas 08/29/2015, 9:58 AM        Time spent >45 mins

## 2015-08-29 NOTE — Progress Notes (Signed)
[facility]

## 2015-08-29 NOTE — Progress Notes (Signed)
Spoke with [name] at [facility].  [name] stated that they did receive one part of the equipment and that they are awaiting another part.  She has [facility] and is just waiting to hear back from them.  She will call MSW as soon as she knows something.  Pt's granddaughter at bedside made aware of the situation.  [name], LCSW Clinical Social Work [phone]

## 2015-08-29 NOTE — Progress Notes (Signed)
Pt ready for d/c.  Notified Ann [phone]) facility and sent d/c summary.  Facility has yet to receive the oximizer.  Ann to call [facility] to inquire about its whereabouts.  Ann to call MSW back when they have it.  Notified Pt and granddaughter, at bedside.  RN aware.  Providence CrosbyAmanda Olivya Sobol, LCSW Clinical Social Work [phone]

## 2015-08-29 NOTE — Progress Notes (Signed)
Sallee ProvencalBlanche Roeper to be D/C'd Skilled nursing facility per MD order.  Discussed with the patient and all questions fully answered.  VSS, Skin clean, dry and intact without evidence of skin break down, no evidence of skin tears noted. IV catheter discontinued intact. Site without signs and symptoms of complications. Dressing and pressure applied.  An After Visit Summary was printed and given to the patient. Patient received prescription.  D/c education completed with patient/family including follow up instructions, medication list, d/c activities limitations if indicated, with other d/c instructions as indicated by MD - patient able to verbalize understanding, all questions fully answered.   Patient instructed to return to ED, call 911, or call MD for any changes in condition.   Patient escorted via WC, and D/C home via [facility].  Pura SpiceJessica K Edwards 08/29/2015 3:05 PM

## 2015-08-29 NOTE — Progress Notes (Signed)
Awaiting for a call from social worker to make sure Summer Graham has all of the oxidizer before discharging at this time.  Will continue to  Care for patient

## 2015-09-27 DEATH — deceased

## 2016-08-29 LAB — BLOOD GAS, ARTERIAL
Acid-Base Excess: 8 mmol/L — ABNORMAL HIGH (ref 0.0–2.0)
BICARBONATE: 31.6 meq/L — AB (ref 20.0–24.0)
Drawn by: 331471
O2 CONTENT: 2 L/min
O2 Saturation: 79 %
PATIENT TEMPERATURE: 98.6
PCO2 ART: 40.3 mmHg (ref 35.0–45.0)
PH ART: 7.505 — AB (ref 7.350–7.450)
PO2 ART: 43.8 mmHg — AB (ref 80.0–100.0)
TCO2: 28.7 mmol/L (ref 0–100)

## 2017-12-17 IMAGING — CT CT ANGIO CHEST
2 of 6 series · 18 of 46 positions shown · IV contrast (OMNIPAQUE 300)
Comparison: Chest radiograph performed 06/25/2015

CLINICAL DATA: Acute onset of decreased O2 saturation and
difficulty breathing. Cough. Initial encounter.

EXAM:
CT ANGIOGRAPHY CHEST WITH CONTRAST
TECHNIQUE: Multidetector CT imaging of the chest was performed using the
standard protocol during bolus administration of intravenous
contrast. Multiplanar CT image reconstructions and MIPs were
obtained to evaluate the vascular anatomy.
CONTRAST:  100mL OMNIPAQUE IOHEXOL 350 MG/ML SOLN

[Series 6: thins for pacs · axial · 0.68mm/px · z∈[-262,-41]mm · 15 of 243 slices shown]
[im 11/243  lung]
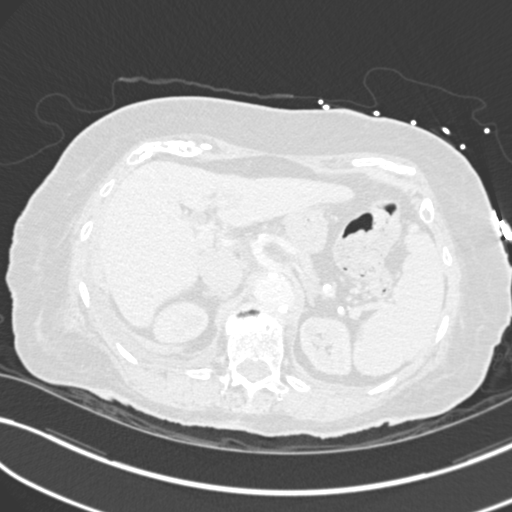
[im 32/243  soft-tissue]
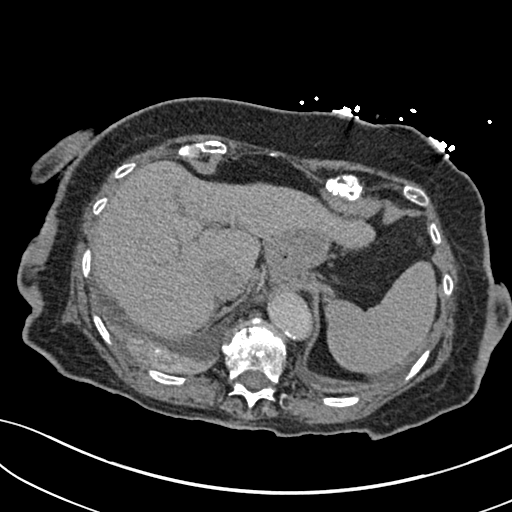
[im 43/243  lung]
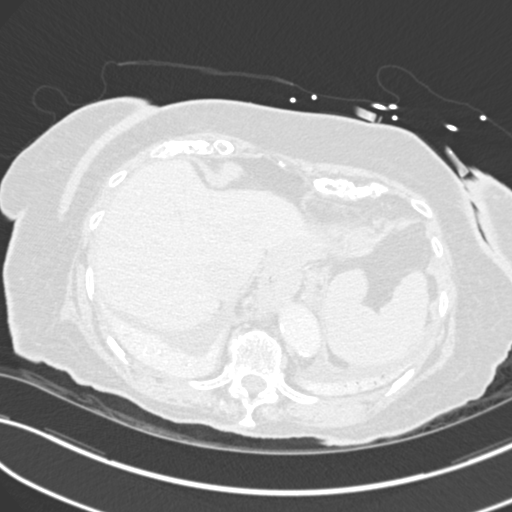
[im 64/243  soft-tissue]
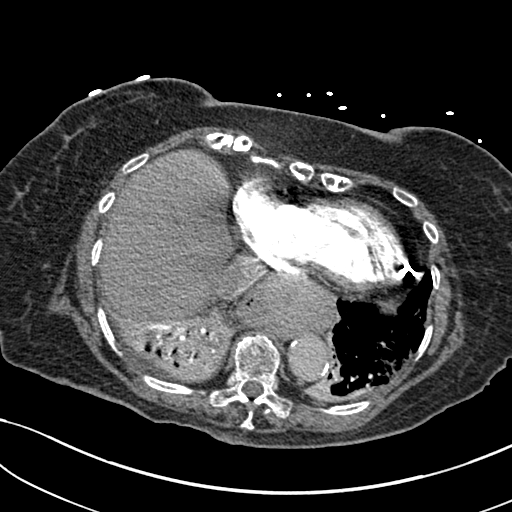
[im 74/243  lung]
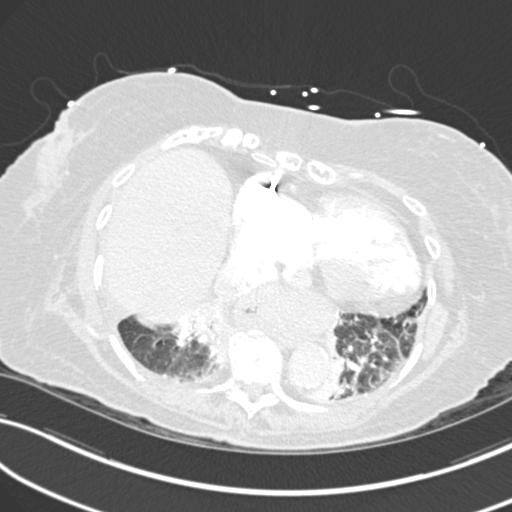
[im 95/243  soft-tissue]
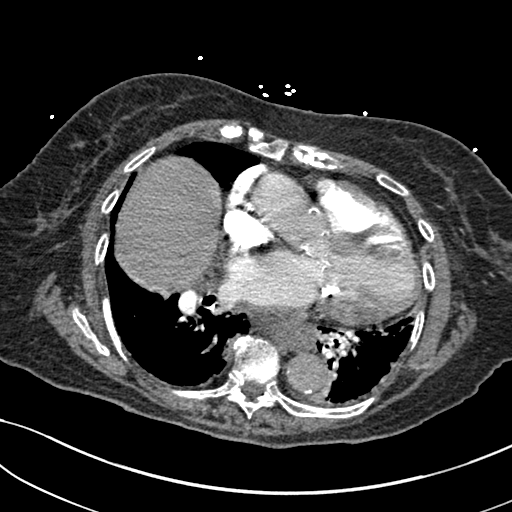
[im 106/243  lung]
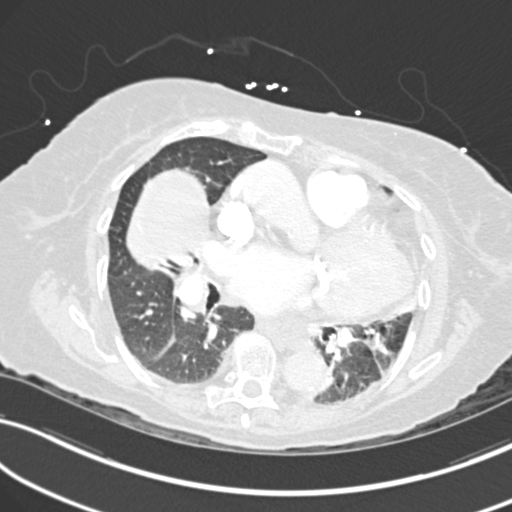
[im 127/243  soft-tissue]
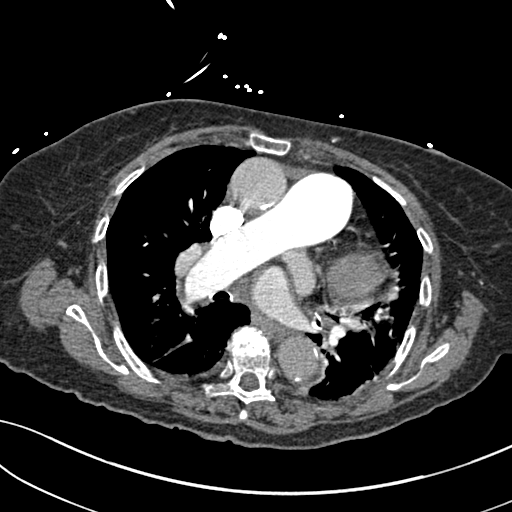
[im 137/243  lung]
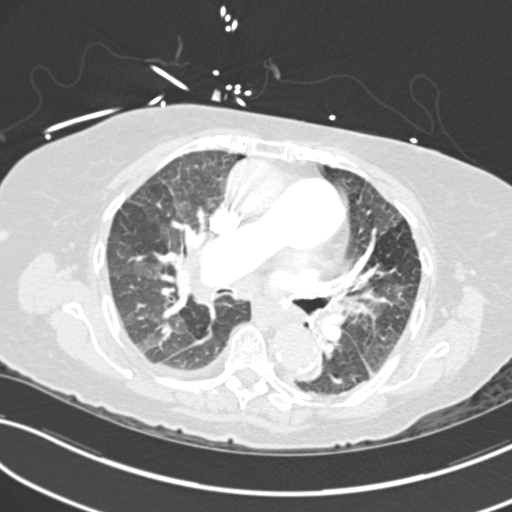
[im 148/243  soft-tissue]
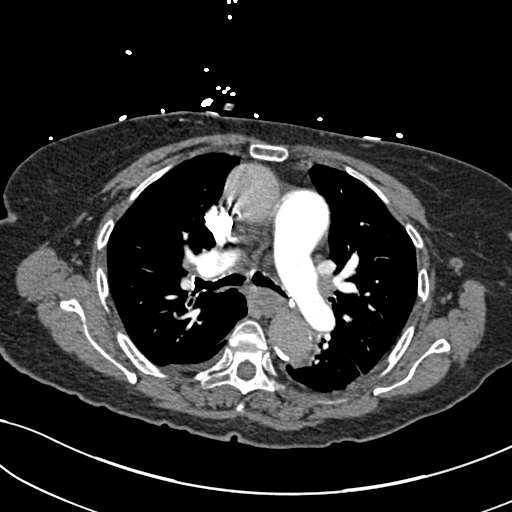
[im 169/243  lung]
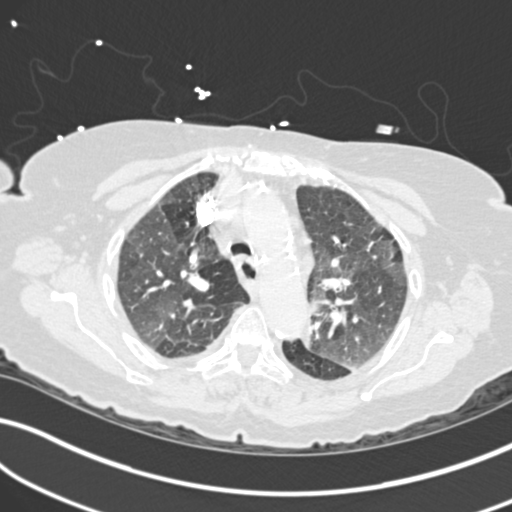
[im 179/243  soft-tissue]
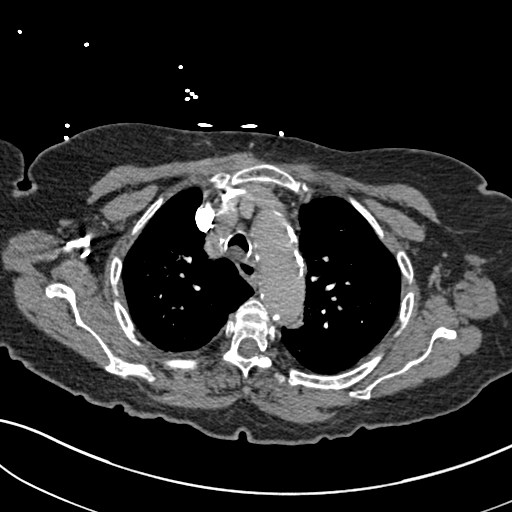
[im 200/243  lung]
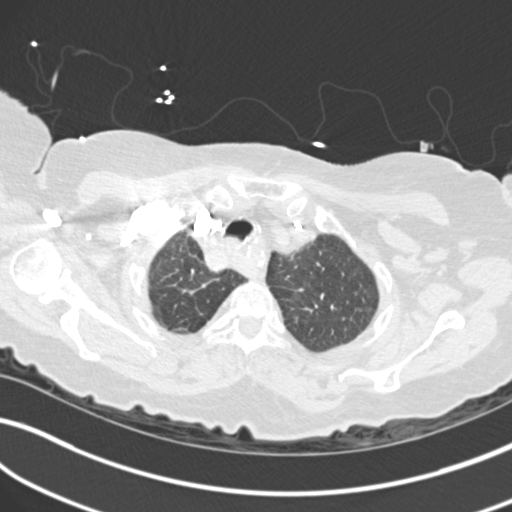
[im 211/243  soft-tissue]
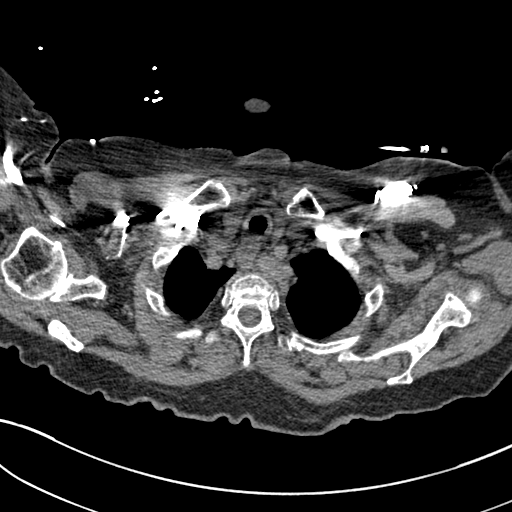
[im 232/243  lung]
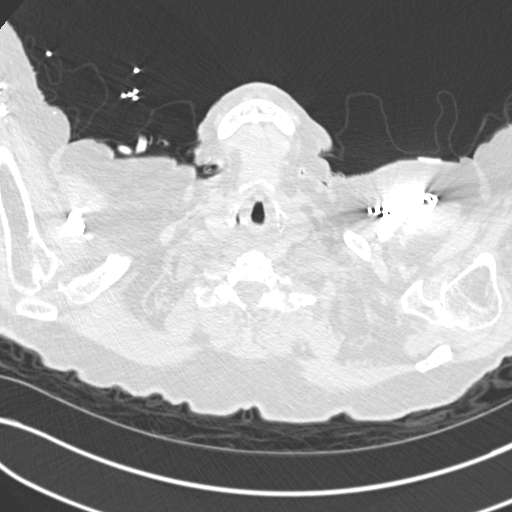

[Series 8: coronal mpr · coronal · 0.47mm/px · 3 of 115 slices shown]
[im 29/115  soft-tissue]
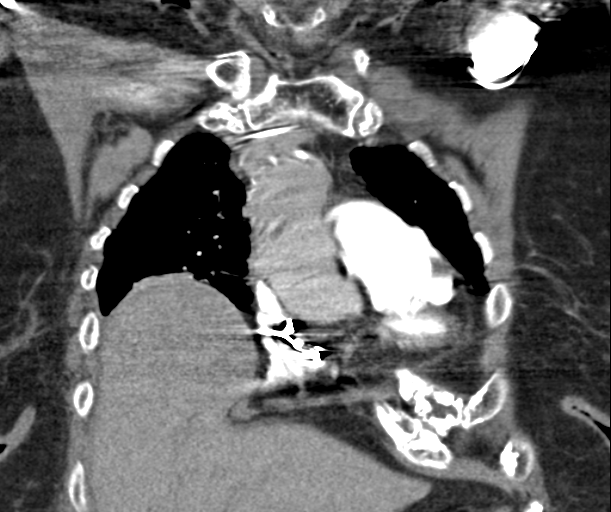
[im 58/115  soft-tissue]
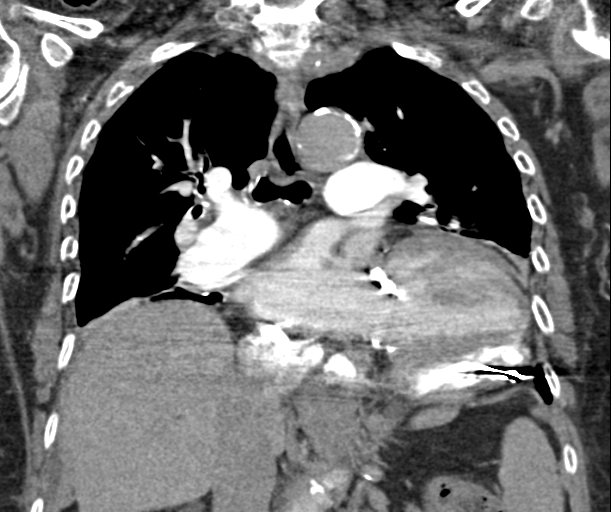
[im 86/115  soft-tissue]
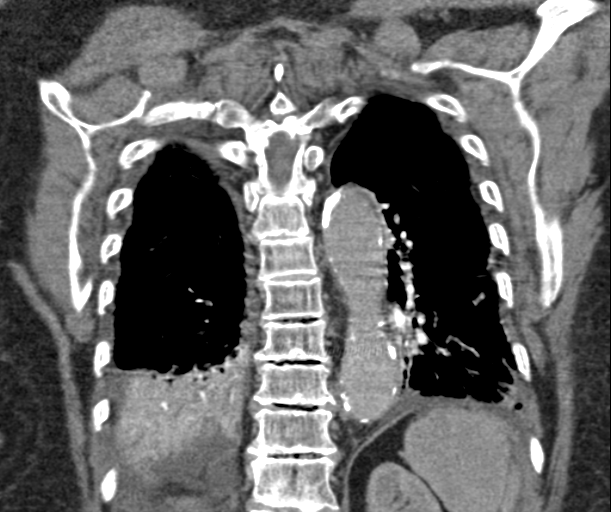

[18 of 46 positions shown; findings below may reference images not displayed]

FINDINGS: There is no evidence of pulmonary embolus.

Dense airspace opacity is noted at the right lung base, and more
mild left basilar atelectasis is noted. There is a mosaic pattern of
parenchymal attenuation within the expanded portions of both lungs.
Trace bilateral pleural fluid is noted. Note is made of mild
bronchomalacia. There is no evidence of pneumothorax. No masses are
identified; no abnormal focal contrast enhancement is seen.

There is nonspecific prominence of the pulmonary outflow tract. A
moderate hiatal hernia is noted. Diffuse coronary artery
calcifications are seen. Dense calcification is noted at the mitral
valve. Scattered calcification is noted along the thoracic aorta and
proximal great vessels. No pericardial effusion is identified. No
definite mediastinal lymphadenopathy is seen. No axillary
lymphadenopathy is seen. The thyroid gland is diminutive and grossly
unremarkable in appearance.

A pacemaker is noted at the left chest wall, with leads ending at
the right atrium and right ventricle.

The visualized portions of the liver and spleen are unremarkable.
The visualized portions of the pancreas, adrenal glands and kidneys
are within normal limits.

No acute osseous abnormalities are seen. Vacuum phenomenon is noted
along the lower thoracic and upper lumbar spine.

Review of the MIP images confirms the above findings.
IMPRESSION: 1. No evidence of pulmonary embolus.
2. Dense airspace opacity at the right lung base,, concerning for
pneumonia. More mild left basilar atelectasis also noted.
3. Mosaic pattern of parenchymal attenuation within the expanded
portions of both lungs is nonspecific. Trace bilateral pleural fluid
seen.
4. Mild bronchomalacia noted.
5. Nonspecific prominence of the pulmonary outflow tract. Diffuse
coronary artery calcifications seen. Dense calcification at the
mitral valve.
6. Moderate hiatal hernia noted.

## 2017-12-29 IMAGING — CR DG CHEST 2V
2 series · 2 of 2 positions shown · non-contrast
Comparison: 06/30/2015

CLINICAL DATA: Shortness of breath.  Cough.

EXAM:
CHEST  2 VIEW

[w chest lat]
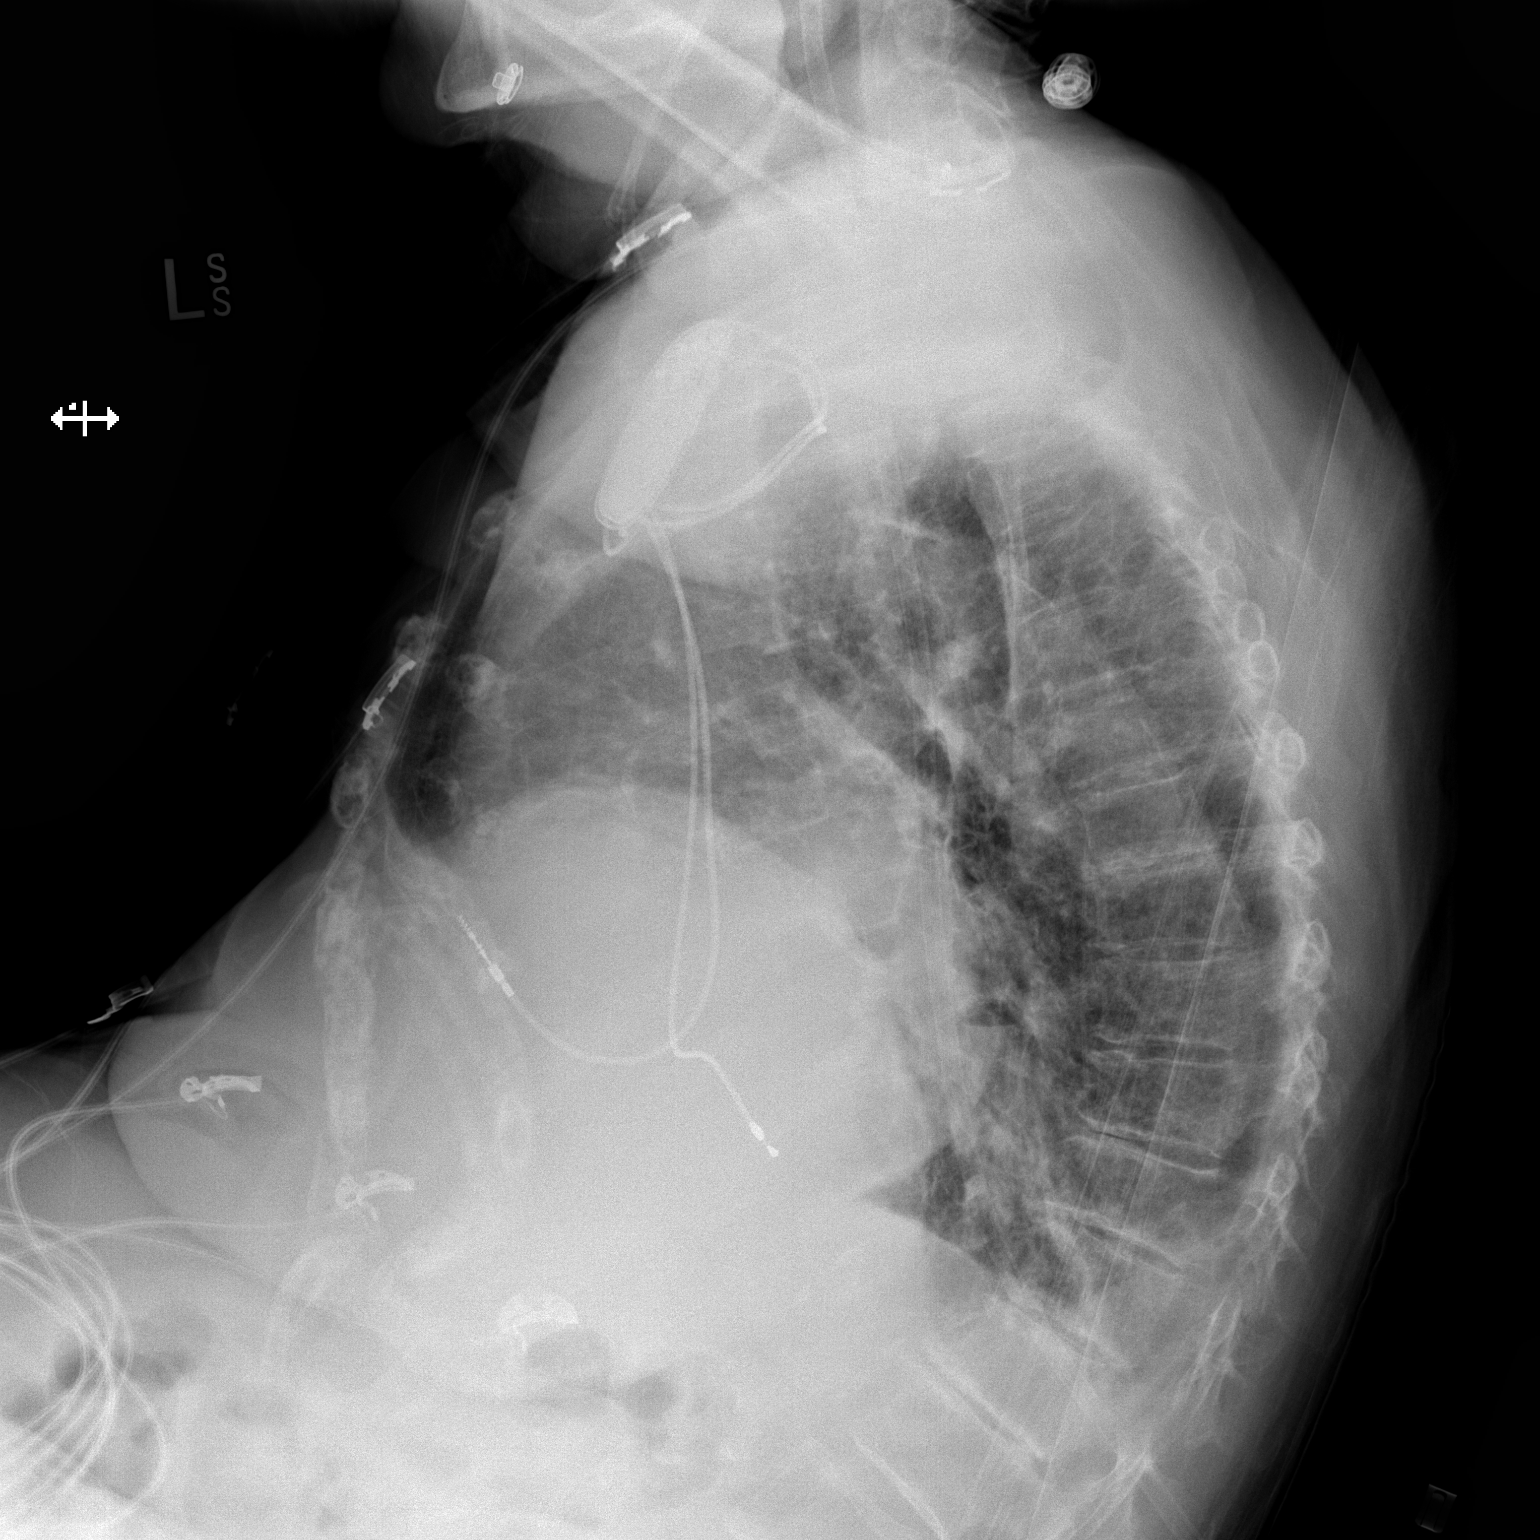

[x chest ap]
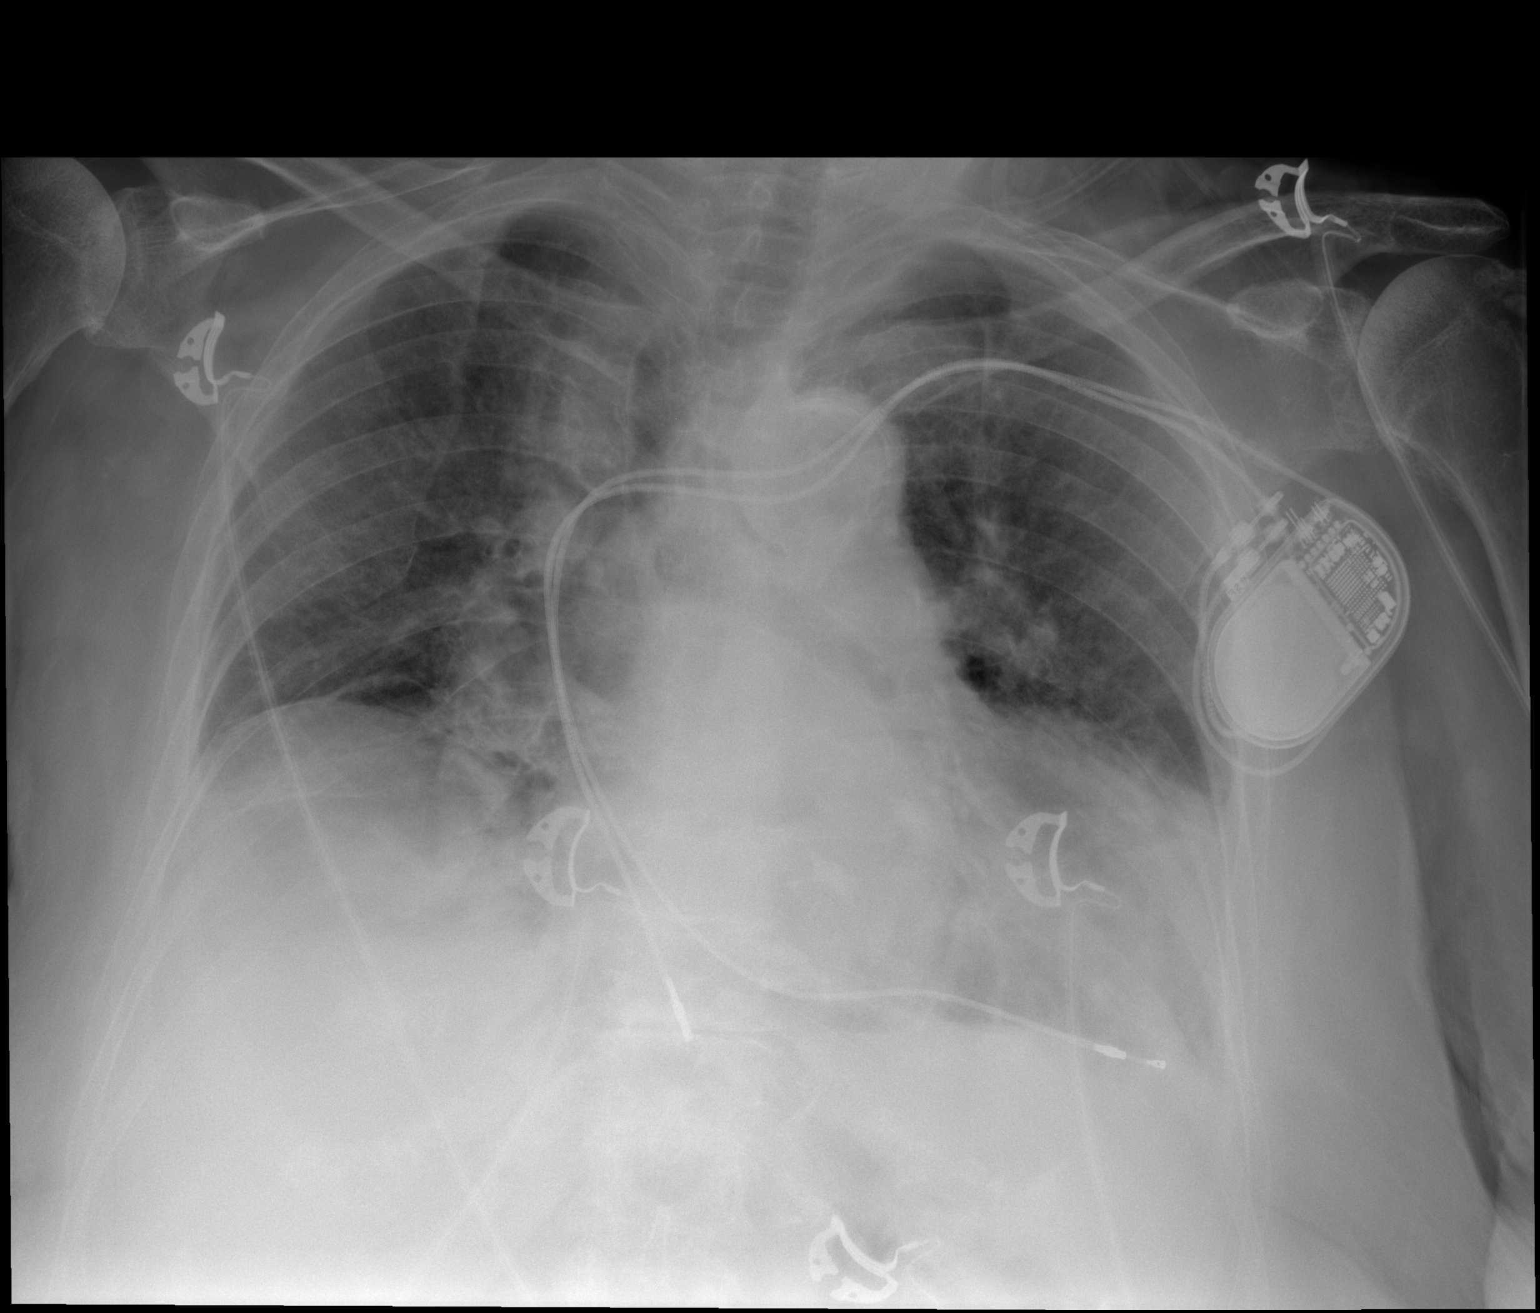

[2 of 2 positions shown; findings below may reference images not displayed]

FINDINGS: Pacer with leads at right atrium and right ventricle. No lead
discontinuity. Midline trachea. Cardiomegaly accentuated by AP
portable technique. Probable small left pleural effusion. No
pneumothorax. Extremely low lung volumes with moderate right
hemidiaphragm elevation. Bibasilar opacities are similar and favored
to represent areas of atelectasis and scarring. No definite lobar
consolidation. No overt congestive failure.
IMPRESSION: Cardiomegaly with probable small left pleural effusion.

Similar bibasilar volume loss with scarring and atelectasis. No
definite superimposed process.
